# Patient Record
Sex: Male | Born: 1945 | Race: White | Hispanic: No | Marital: Married | Smoking: Current some day smoker
Health system: Southern US, Community
[De-identification: ages and names within clinical notes are randomized; demographics above are authoritative.]

## PROBLEM LIST (undated history)

## (undated) DIAGNOSIS — I10 Essential (primary) hypertension: Secondary | ICD-10-CM

## (undated) DIAGNOSIS — D329 Benign neoplasm of meninges, unspecified: Secondary | ICD-10-CM

## (undated) DIAGNOSIS — W3400XA Accidental discharge from unspecified firearms or gun, initial encounter: Secondary | ICD-10-CM

## (undated) DIAGNOSIS — IMO0002 Reserved for concepts with insufficient information to code with codable children: Secondary | ICD-10-CM

## (undated) DIAGNOSIS — C3491 Malignant neoplasm of unspecified part of right bronchus or lung: Principal | ICD-10-CM

## (undated) DIAGNOSIS — E7800 Pure hypercholesterolemia, unspecified: Secondary | ICD-10-CM

## (undated) DIAGNOSIS — J189 Pneumonia, unspecified organism: Secondary | ICD-10-CM

## (undated) DIAGNOSIS — E119 Type 2 diabetes mellitus without complications: Secondary | ICD-10-CM

## (undated) DIAGNOSIS — J449 Chronic obstructive pulmonary disease, unspecified: Secondary | ICD-10-CM

## (undated) DIAGNOSIS — I809 Phlebitis and thrombophlebitis of unspecified site: Secondary | ICD-10-CM

## (undated) DIAGNOSIS — Z9289 Personal history of other medical treatment: Secondary | ICD-10-CM

## (undated) HISTORY — PX: HERNIA REPAIR: SHX51

## (undated) HISTORY — DX: Benign neoplasm of meninges, unspecified: D32.9

## (undated) HISTORY — PX: KNEE SURGERY: SHX244

## (undated) HISTORY — PX: COLONOSCOPY: SHX174

## (undated) HISTORY — PX: ABDOMINAL SURGERY: SHX537

## (undated) HISTORY — DX: Malignant neoplasm of unspecified part of right bronchus or lung: C34.91

---
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
## 1999-09-13 ENCOUNTER — Encounter: Admission: RE | Admit: 1999-09-13 | Discharge: 1999-09-13 | Payer: Self-pay | Admitting: Urology

## 1999-09-13 ENCOUNTER — Encounter: Payer: Self-pay | Admitting: Urology

## 2006-10-21 ENCOUNTER — Inpatient Hospital Stay (HOSPITAL_COMMUNITY): Admission: EM | Admit: 2006-10-21 | Discharge: 2006-10-23 | Payer: Self-pay

## 2011-09-05 ENCOUNTER — Other Ambulatory Visit: Payer: Self-pay | Admitting: Anesthesiology

## 2011-09-05 DIAGNOSIS — M542 Cervicalgia: Secondary | ICD-10-CM

## 2011-09-10 ENCOUNTER — Ambulatory Visit
Admission: RE | Admit: 2011-09-10 | Discharge: 2011-09-10 | Disposition: A | Discharge status: Home / Self Care | Payer: Medicare Other | Source: Ambulatory Visit | Attending: Anesthesiology | Admitting: Anesthesiology

## 2011-09-10 DIAGNOSIS — M542 Cervicalgia: Secondary | ICD-10-CM

## 2012-12-27 ENCOUNTER — Emergency Department (HOSPITAL_COMMUNITY): Payer: Medicare Other

## 2012-12-27 ENCOUNTER — Emergency Department (HOSPITAL_COMMUNITY)
Admission: EM | Admit: 2012-12-27 | Discharge: 2012-12-28 | Discharge status: Against Medical Advice | Payer: Medicare Other | Attending: Emergency Medicine | Admitting: Emergency Medicine

## 2012-12-27 ENCOUNTER — Encounter (HOSPITAL_COMMUNITY): Payer: Self-pay | Admitting: Emergency Medicine

## 2012-12-27 DIAGNOSIS — M546 Pain in thoracic spine: Secondary | ICD-10-CM | POA: Insufficient documentation

## 2012-12-27 DIAGNOSIS — J449 Chronic obstructive pulmonary disease, unspecified: Secondary | ICD-10-CM | POA: Insufficient documentation

## 2012-12-27 DIAGNOSIS — M542 Cervicalgia: Secondary | ICD-10-CM | POA: Insufficient documentation

## 2012-12-27 DIAGNOSIS — M545 Low back pain, unspecified: Secondary | ICD-10-CM | POA: Insufficient documentation

## 2012-12-27 DIAGNOSIS — I10 Essential (primary) hypertension: Secondary | ICD-10-CM | POA: Insufficient documentation

## 2012-12-27 DIAGNOSIS — G8929 Other chronic pain: Secondary | ICD-10-CM | POA: Insufficient documentation

## 2012-12-27 DIAGNOSIS — Z87891 Personal history of nicotine dependence: Secondary | ICD-10-CM | POA: Insufficient documentation

## 2012-12-27 DIAGNOSIS — J4489 Other specified chronic obstructive pulmonary disease: Secondary | ICD-10-CM | POA: Insufficient documentation

## 2012-12-27 DIAGNOSIS — Z79899 Other long term (current) drug therapy: Secondary | ICD-10-CM | POA: Insufficient documentation

## 2012-12-27 DIAGNOSIS — M549 Dorsalgia, unspecified: Secondary | ICD-10-CM

## 2012-12-27 DIAGNOSIS — Z8739 Personal history of other diseases of the musculoskeletal system and connective tissue: Secondary | ICD-10-CM | POA: Insufficient documentation

## 2012-12-27 DIAGNOSIS — Z8669 Personal history of other diseases of the nervous system and sense organs: Secondary | ICD-10-CM | POA: Insufficient documentation

## 2012-12-27 DIAGNOSIS — E7800 Pure hypercholesterolemia, unspecified: Secondary | ICD-10-CM | POA: Insufficient documentation

## 2012-12-27 DIAGNOSIS — Z8781 Personal history of (healed) traumatic fracture: Secondary | ICD-10-CM | POA: Insufficient documentation

## 2012-12-27 DIAGNOSIS — E119 Type 2 diabetes mellitus without complications: Secondary | ICD-10-CM | POA: Insufficient documentation

## 2012-12-27 HISTORY — DX: Reserved for concepts with insufficient information to code with codable children: IMO0002

## 2012-12-27 HISTORY — DX: Pure hypercholesterolemia, unspecified: E78.00

## 2012-12-27 HISTORY — DX: Type 2 diabetes mellitus without complications: E11.9

## 2012-12-27 HISTORY — DX: Chronic obstructive pulmonary disease, unspecified: J44.9

## 2012-12-27 HISTORY — DX: Essential (primary) hypertension: I10

## 2012-12-27 LAB — COMPREHENSIVE METABOLIC PANEL
ALT: 110 U/L — ABNORMAL HIGH (ref 0–53)
AST: 88 U/L — ABNORMAL HIGH (ref 0–37)
Alkaline Phosphatase: 68 U/L (ref 39–117)
CO2: 32 mEq/L (ref 19–32)
Chloride: 99 mEq/L (ref 96–112)
Creatinine, Ser: 0.9 mg/dL (ref 0.50–1.35)
GFR calc non Af Amer: 87 mL/min — ABNORMAL LOW (ref >90)
Sodium: 139 mEq/L (ref 135–145)
Total Bilirubin: 0.7 mg/dL (ref 0.3–1.2)

## 2012-12-27 LAB — CBC WITH DIFFERENTIAL/PLATELET
Basophils Absolute: 0.1 10*3/uL (ref 0.0–0.1)
HCT: 47.9 % (ref 39.0–52.0)
Lymphocytes Relative: 27 % (ref 12–46)
Monocytes Absolute: 1 10*3/uL (ref 0.1–1.0)
Neutro Abs: 4.3 10*3/uL (ref 1.7–7.7)
RDW: 13.7 % (ref 11.5–15.5)
WBC: 7.8 10*3/uL (ref 4.0–10.5)

## 2012-12-27 LAB — URINALYSIS, ROUTINE W REFLEX MICROSCOPIC
Hgb urine dipstick: NEGATIVE
Ketones, ur: NEGATIVE mg/dL
Protein, ur: NEGATIVE mg/dL
Urobilinogen, UA: 4 mg/dL — ABNORMAL HIGH (ref 0.0–1.0)

## 2012-12-27 MED ORDER — HYDROMORPHONE HCL PF 2 MG/ML IJ SOLN
2.0000 mg | Freq: Once | INTRAMUSCULAR | Status: AC
Start: 2012-12-27 — End: 2012-12-27
  Administered 2012-12-27: 2 mg via INTRAMUSCULAR
  Filled 2012-12-27: qty 1
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
## 2012-12-28 NOTE — ED Notes (Signed)
Pt. Advised by PA that he would be considered AMA if he left before his results from his xrays were resulted. Pt. Left anyway.

## 2012-12-29 NOTE — ED Provider Notes (Signed)
Medical screening examination/treatment/procedure(s) were performed by non-physician practitioner and as supervising physician I was immediately available for consultation/collaboration.  Sunnie Nielsen, MD 12/29/12 [phone]

## 2012-12-30 LAB — URINE CULTURE
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
## 2014-10-04 ENCOUNTER — Encounter: Payer: Self-pay | Admitting: Pulmonary Disease

## 2014-10-04 ENCOUNTER — Ambulatory Visit (INDEPENDENT_AMBULATORY_CARE_PROVIDER_SITE_OTHER): Payer: Medicare Other | Admitting: Pulmonary Disease

## 2014-10-04 VITALS — BP 128/74 | HR 106 | Ht 65.0 in | Wt 200.0 lb

## 2014-10-04 DIAGNOSIS — Z720 Tobacco use: Secondary | ICD-10-CM

## 2014-10-04 DIAGNOSIS — R918 Other nonspecific abnormal finding of lung field: Secondary | ICD-10-CM

## 2014-10-04 DIAGNOSIS — J42 Unspecified chronic bronchitis: Secondary | ICD-10-CM

## 2014-10-04 NOTE — Progress Notes (Deleted)
   Subjective:    Patient ID: [name] Day, male    DOB: 28-Sep-1945, 69 y.o.   MRN: [id number]  HPI    Review of Systems  Constitutional: Negative for fever and unexpected weight change.  HENT: Positive for congestion. Negative for dental problem, ear pain, nosebleeds, postnasal drip, rhinorrhea, sinus pressure, sneezing, sore throat and trouble swallowing.   Eyes: Negative for redness and itching.  Respiratory: Positive for cough and shortness of breath. Negative for chest tightness and wheezing.   Cardiovascular: Positive for leg swelling ( feet swelling). Negative for palpitations.  Gastrointestinal: Negative for nausea and vomiting.  Genitourinary: Negative for dysuria.  Musculoskeletal: Negative for joint swelling.  Skin: Negative for rash.  Neurological: Negative for headaches.  Hematological: Does not bruise/bleed easily.  Psychiatric/Behavioral: Negative for dysphoric mood. The patient is nervous/anxious.        Objective:   Physical Exam        Assessment & Plan:

## 2014-10-04 NOTE — Progress Notes (Signed)
Chief Complaint  Patient presents with  . PULMONARY CONSULT    Referred by Dr Hardin Negus. Abn CT at [facility] -- lung mass in right upper lung    History of Present Illness: [name] Day is a 69 y.o. male smoker for evaluation of lung mass.  He has been seen by surgery for back pain.  He was told by his insurance company that he needed lung cancer screening.  He was found to have a lung mass in Rt lung.  He continues to smoke up to 1 pack per day.  He uses advair and prn albuterol.  He gets cough and chest congestion.  He gets winded with activity, but is limited more because of back and joint pain.  He denies fever, chest pain, headache, voice change, vision change, sweats, weight loss, or hemoptysis.  He was told he has COPD.  He has not had recent PFTs.  He had pneumonia in January 2015.  There is no history of TB.  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  He was never in the [employer].  He denies animal exposures.  He is from [place].  Tests: CT chest 09/14/14 >> atherosclerosis, 10 mm Rt paratracheal LAN, 2.6 cm low attenuation in liver no change from 2014, 3.1 x 2.9 cm mass RUL, 5 mm nodule lingula  [name] Day  has a past medical history of Meningioma; COPD (chronic obstructive pulmonary disease); Diabetes mellitus without complication; Hypertension; Hypercholesteremia; and DDD (degenerative disc disease).  [name] Day  has past surgical history that includes Abdominal surgery and Hernia repair.  Prior to Admission medications   Medication Sig Start Date End Date Taking? Authorizing Provider  albuterol (PROVENTIL HFA;VENTOLIN HFA) 108 (90 BASE) MCG/ACT inhaler Inhale 2 puffs into the lungs every 4 (four) hours as needed for wheezing. For wheezing   Yes Historical Provider, MD  Cholecalciferol 4000 UNITS CAPS Take 1 capsule by mouth daily.   Yes Historical Provider, MD  diazepam (VALIUM) 5 MG tablet Take 5 mg by mouth 2 (two) times daily as needed. For sleep  and pain   Yes Historical Provider, MD  losartan (COZAAR) 100 MG tablet Take 100 mg by mouth daily.   Yes Historical Provider, MD  oxyCODONE-acetaminophen (PERCOCET) 7.5-325 MG per tablet Take 1 tablet by mouth every 6 (six) hours as needed for pain. For pain   Yes Historical Provider, MD  oxymorphone (OPANA ER) 30 MG 12 hr tablet Take 30 mg by mouth every 12 (twelve) hours.   Yes Historical Provider, MD  pravastatin (PRAVACHOL) 20 MG tablet Take 20 mg by mouth at bedtime.   Yes Historical Provider, MD  testosterone cypionate (DEPOTESTOTERONE CYPIONATE) 100 MG/ML injection Inject 100 mg into the muscle every 7 (seven) days. For IM use only every Fri   Yes Historical Provider, MD    No Known Allergies  His family history includes Allergies in his sister; Bone cancer in his father; Emphysema in his mother and sister; Heart disease in his brother; Liver cancer in his father; Lung cancer in his father and sister.  He  reports that he has been smoking Cigarettes.  He has a 55 pack-year smoking history. His smokeless tobacco use includes Chew. He reports that he does not drink alcohol or use illicit drugs.  Review of Systems  Constitutional: Negative for fever and unexpected weight change.  HENT: Positive for congestion. Negative for dental problem, ear pain, nosebleeds, postnasal drip, rhinorrhea, sinus pressure, sneezing, sore throat and trouble swallowing.  Eyes: Negative for redness and itching.  Respiratory: Positive for cough and shortness of breath. Negative for chest tightness and wheezing.   Cardiovascular: Positive for leg swelling ( feet swelling). Negative for palpitations.  Gastrointestinal: Negative for nausea and vomiting.  Genitourinary: Negative for dysuria.  Musculoskeletal: Negative for joint swelling.  Skin: Negative for rash.  Neurological: Negative for headaches.  Hematological: Does not bruise/bleed easily.  Psychiatric/Behavioral: Negative for dysphoric mood. The patient  is nervous/anxious.    Physical Exam: Blood pressure 128/74, pulse 106, height 5\' 5"  (1.651 m), weight 200 lb (90.719 kg), SpO2 93 %. Body mass index is 33.28 kg/(m^2).  General - No distress ENT - No sinus tenderness, no oral exudate, no LAN, no thyromegaly, TM clear, pupils equal/reactive, wears dentures, poor dentition Cardiac - s1s2 regular, no murmur, pulses symmetric Chest - No wheeze/rales/dullness, good air entry, normal respiratory excursion Back - No focal tenderness Abd - Soft, non-tender, no organomegaly, + bowel sounds Ext - No edema Neuro - Normal strength, cranial nerves intact Skin - No rashes Psych - Normal mood, and behavior   Lab Results  Component Value Date   WBC 7.8 12/27/2012   HGB 16.3 12/27/2012   HCT 47.9 12/27/2012   MCV 97.6 12/27/2012   PLT 238 12/27/2012    Lab Results  Component Value Date   CREATININE 0.90 12/27/2012   BUN 14 12/27/2012   NA 139 12/27/2012   K 3.9 12/27/2012   CL 99 12/27/2012   CO2 32 12/27/2012    Lab Results  Component Value Date   ALT 110* 12/27/2012   AST 88* 12/27/2012   ALKPHOS 68 12/27/2012   BILITOT 0.7 12/27/2012   Discussion: He has spiculated mass in Rt upper lobe.  He has small nodule in lingula, and borderline mediastinal LAN.  He has continued tobacco abuse, and hx of COPD.  Main concern is that he has primary lung cancer.  Assessment/Plan:  Rt upper lung mass. Plan: - will arrange for PET scan and PFT  - depending on results will determine if he should have biopsy first or referral to thoracic surgery - if he needs biopsy, then needle biopsy with IR might be best approach  COPD with chronic bronchitis. Plan: - continue advair and prn albuterol  Tobacco abuse. Plan: - reviewed options to assist with smoking cessation.    Chesley Mires, MD [facility] Pager:  [phone]

## 2014-10-04 NOTE — Patient Instructions (Signed)
Will schedule PET scan and breathing test (PFT) Follow up in 6 weeks with Dr. Halford Chessman or [facility]

## 2014-10-13 ENCOUNTER — Ambulatory Visit (HOSPITAL_COMMUNITY)
Admission: RE | Admit: 2014-10-13 | Discharge: 2014-10-13 | Disposition: A | Discharge status: Home / Self Care | Payer: Medicare Other | Source: Ambulatory Visit | Attending: Pulmonary Disease | Admitting: Pulmonary Disease

## 2014-10-13 DIAGNOSIS — J42 Unspecified chronic bronchitis: Secondary | ICD-10-CM

## 2014-10-13 DIAGNOSIS — R918 Other nonspecific abnormal finding of lung field: Secondary | ICD-10-CM | POA: Diagnosis present

## 2014-10-13 DIAGNOSIS — Z720 Tobacco use: Secondary | ICD-10-CM

## 2014-10-13 LAB — GLUCOSE, CAPILLARY: GLUCOSE-CAPILLARY: 70 mg/dL (ref 70–99)

## 2014-10-13 MED ORDER — FLUDEOXYGLUCOSE F - 18 (FDG) INJECTION
9.9600 | Freq: Once | INTRAVENOUS | Status: AC | PRN
  Administered 2014-10-13: 9.96 via INTRAVENOUS

## 2014-10-19 ENCOUNTER — Telehealth: Payer: Self-pay | Admitting: Pulmonary Disease

## 2014-10-19 DIAGNOSIS — J984 Other disorders of lung: Secondary | ICD-10-CM

## 2014-10-19 NOTE — Telephone Encounter (Signed)
PET done 10/13/14. Calling for results. Please advise thanks

## 2014-10-19 NOTE — Telephone Encounter (Signed)
PET scan 0/12/22 >> hypermetabolic RUL cavitary mass, and LUL nodule  Results d/w pt.  Will need PFTs >> scheduled.  Will review results with Dr. Lamonte Sakai and determine if ENB is option to assess LUL nodule.

## 2014-10-20 NOTE — Telephone Encounter (Signed)
I reviewed films and discussed with Dr Halford Chessman. The size and location of the LUL nodule make it very technically difficult to get a biopsy even w ENB. I suspect ENB would be low yield until the nodule grows some - even then it would be difficult. I think best plan would be to concentrate on the large RUL nodule, either by standard FOB or by needle bx.

## 2014-10-24 NOTE — Telephone Encounter (Signed)
This has already been discussed with patient per VS. Nothing further needed.

## 2014-11-01 NOTE — Telephone Encounter (Signed)
Informed pt that getting to Lt lung nodule will be difficulty with bronchoscopy and ENB.  Will arrange for IR to assess for biopsy of Rt lung mass.  If not feasible, he would then need bronchoscopy and biopsy from Rt lung mass.

## 2014-11-01 NOTE — Addendum Note (Signed)
Addended by: Chesley Mires on: 11/01/2014 09:18 AM   Modules accepted: Orders

## 2014-11-06 ENCOUNTER — Other Ambulatory Visit: Payer: Self-pay | Admitting: Radiology

## 2014-11-07 ENCOUNTER — Ambulatory Visit (HOSPITAL_COMMUNITY)
Admission: RE | Admit: 2014-11-07 | Discharge: 2014-11-07 | Disposition: A | Discharge status: Home / Self Care | Payer: Medicare Other | Source: Ambulatory Visit | Attending: Interventional Radiology | Admitting: Interventional Radiology

## 2014-11-07 ENCOUNTER — Ambulatory Visit (HOSPITAL_COMMUNITY)
Admission: RE | Admit: 2014-11-07 | Discharge: 2014-11-07 | Disposition: A | Discharge status: Home / Self Care | Payer: Medicare Other | Source: Ambulatory Visit | Attending: Pulmonary Disease | Admitting: Pulmonary Disease

## 2014-11-07 ENCOUNTER — Encounter (HOSPITAL_COMMUNITY): Payer: Self-pay

## 2014-11-07 DIAGNOSIS — Z801 Family history of malignant neoplasm of trachea, bronchus and lung: Secondary | ICD-10-CM | POA: Diagnosis not present

## 2014-11-07 DIAGNOSIS — C3411 Malignant neoplasm of upper lobe, right bronchus or lung: Secondary | ICD-10-CM | POA: Diagnosis not present

## 2014-11-07 DIAGNOSIS — E119 Type 2 diabetes mellitus without complications: Secondary | ICD-10-CM | POA: Diagnosis not present

## 2014-11-07 DIAGNOSIS — J984 Other disorders of lung: Secondary | ICD-10-CM

## 2014-11-07 DIAGNOSIS — I10 Essential (primary) hypertension: Secondary | ICD-10-CM | POA: Insufficient documentation

## 2014-11-07 DIAGNOSIS — J449 Chronic obstructive pulmonary disease, unspecified: Secondary | ICD-10-CM | POA: Insufficient documentation

## 2014-11-07 DIAGNOSIS — Z79899 Other long term (current) drug therapy: Secondary | ICD-10-CM | POA: Insufficient documentation

## 2014-11-07 DIAGNOSIS — F17210 Nicotine dependence, cigarettes, uncomplicated: Secondary | ICD-10-CM | POA: Insufficient documentation

## 2014-11-07 DIAGNOSIS — J95811 Postprocedural pneumothorax: Secondary | ICD-10-CM

## 2014-11-07 DIAGNOSIS — R918 Other nonspecific abnormal finding of lung field: Secondary | ICD-10-CM | POA: Diagnosis present

## 2014-11-07 DIAGNOSIS — E780 Pure hypercholesterolemia: Secondary | ICD-10-CM | POA: Insufficient documentation

## 2014-11-07 DIAGNOSIS — Z7951 Long term (current) use of inhaled steroids: Secondary | ICD-10-CM | POA: Insufficient documentation

## 2014-11-07 DIAGNOSIS — Z825 Family history of asthma and other chronic lower respiratory diseases: Secondary | ICD-10-CM | POA: Diagnosis not present

## 2014-11-07 LAB — PROTIME-INR
INR: 0.98 (ref 0.00–1.49)
Prothrombin Time: 13.1 seconds (ref 11.6–15.2)

## 2014-11-07 LAB — CBC
HCT: 46.6 % (ref 39.0–52.0)
Hemoglobin: 15.4 g/dL (ref 13.0–17.0)
MCH: 32.4 pg (ref 26.0–34.0)
MCHC: 33 g/dL (ref 30.0–36.0)
MCV: 98.1 fL (ref 78.0–100.0)
Platelets: 180 10*3/uL (ref 150–400)
RBC: 4.75 MIL/uL (ref 4.22–5.81)
RDW: 14.3 % (ref 11.5–15.5)
WBC: 7.7 10*3/uL (ref 4.0–10.5)

## 2014-11-07 LAB — GLUCOSE, CAPILLARY: Glucose-Capillary: 132 mg/dL — ABNORMAL HIGH (ref 70–99)

## 2014-11-07 LAB — APTT: APTT: 29 s (ref 24–37)

## 2014-11-07 MED ORDER — SODIUM CHLORIDE 0.9 % IV SOLN
INTRAVENOUS | Status: DC
  Administered 2014-11-07: 08:00:00 via INTRAVENOUS

## 2014-11-07 MED ORDER — OXYCODONE-ACETAMINOPHEN 5-325 MG PO TABS
1.0000 | ORAL_TABLET | ORAL | Status: DC | PRN
Start: 2014-11-07 — End: 2014-11-08
  Administered 2014-11-07: 1 via ORAL
  Filled 2014-11-07: qty 1

## 2014-11-07 MED ORDER — MIDAZOLAM HCL 2 MG/2ML IJ SOLN
INTRAMUSCULAR | Status: AC
  Filled 2014-11-07: qty 4

## 2014-11-07 MED ORDER — MIDAZOLAM HCL 2 MG/2ML IJ SOLN
INTRAMUSCULAR | Status: AC | PRN
  Administered 2014-11-07: 1 mg via INTRAVENOUS

## 2014-11-07 MED ORDER — FENTANYL CITRATE (PF) 100 MCG/2ML IJ SOLN
INTRAMUSCULAR | Status: AC | PRN
  Administered 2014-11-07: 25 ug via INTRAVENOUS

## 2014-11-07 MED ORDER — FENTANYL CITRATE (PF) 100 MCG/2ML IJ SOLN
INTRAMUSCULAR | Status: AC
  Filled 2014-11-07: qty 2

## 2014-11-07 MED ORDER — SODIUM CHLORIDE 0.9 % IV SOLN
8.0000 mg | Freq: Four times a day (QID) | INTRAVENOUS | Status: DC | PRN
  Filled 2014-11-07: qty 4

## 2014-11-07 MED ORDER — OXYCODONE-ACETAMINOPHEN 5-325 MG PO TABS
ORAL_TABLET | ORAL | Status: AC
  Administered 2014-11-07: 1 via ORAL
  Filled 2014-11-07: qty 1

## 2014-11-07 NOTE — Sedation Documentation (Signed)
Pt has stopped coughing- no hemoptysis at this time. MD wants pt to wear 02 for 'support' only. sats 91% 2L/[place]. Pt was 90-92% R AIR prior to start.

## 2014-11-07 NOTE — Discharge Instructions (Signed)
Lung Biopsy A lung biopsy is a procedure in which a tissue sample is removed from the lung. The tissue can be examined under a microscope to help diagnose various lung disorders.  LET [facility] CARE PROVIDER KNOW ABOUT:  Any allergies you have.  All medicines you are taking, including vitamins, herbs, eye drops, creams, and over-the-counter medicines.  Previous problems you or members of your family have had with the use of anesthetics.  Any blood disorders or bleeding problems that you have.  Previous surgeries you have had.  Medical conditions you have. RISKS AND COMPLICATIONS Generally, a lung biopsy is a safe procedure. However, problems can occur and include:  Collapse of the lung.   Bleeding.   Infection.  BEFORE THE PROCEDURE  Do not eat or drink anything after midnight on the night before the procedure or as directed by your health care provider.  Ask your health care provider about changing or stopping your regular medicines. This is especially important if you are taking diabetes medicines or blood thinners.  Plan to have someone take you home after the procedure. PROCEDURE Various methods can be used to perform a lung biopsy:   Needle biopsy. A biopsy needle is inserted into the lung. The needle is used to collect the tissue sample. A CT scanner may be used to guide the needle to the right place in the lung. For this method, a medicine is used to numb the area where the biopsy sample will be taken (local anesthetic).  Bronchoscopy. A flexible tube (bronchoscope) is inserted into your lungs by going through your mouth or nose. A needle or forceps is passed through the bronchoscope to remove the tissue sample. For this method, medicine may be used to numb the back of your throat.  Open biopsy. A cut (incision) is made in your chest. The tissue sample is then removed using surgical tools. The incision is closed with skin glue, skin adhesive strips, or stitches. For  this method, you will be given medicine to make you sleep through the procedure (general anesthetic). AFTER THE PROCEDURE  Your recovery will be assessed and monitored.  You might have soreness and tenderness at the site of the biopsy for a few days after the procedure.  You might have a cough and some soreness in your throat for a few days if a bronchoscope was used. Document Released: 09/18/2004 Document Revised: 11/14/2013 Document Reviewed: 12/12/2012 [facility] Patient Information 2015 [place], [place]. This information is not intended to replace advice given to you by your health care provider. Make sure you discuss any questions you have with your health care provider. Needle Biopsy of Lung, Care After Refer to this sheet in the next few weeks. These instructions provide you with information on caring for yourself after your procedure. Your health care provider may also give you more specific instructions. Your treatment has been planned according to current medical practices, but problems sometimes occur. Call your health care provider if you have any problems or questions after your procedure. WHAT TO EXPECT AFTER THE PROCEDURE  A bandage will be applied over the area where the needle was inserted. You may be asked to apply pressure to the bandage for several minutes to ensure there is minimal bleeding.  In most cases, you can leave when your needle biopsy procedure is completed. Do not drive yourself home. Someone else should take you home.  If you received an IV sedative or general anesthetic, you will be taken to a comfortable place  to relax while the medicine wears off.  If you have upcoming travel scheduled, talk to your health care provider about when it is safe to travel by air after the procedure. HOME CARE INSTRUCTIONS  Expect to take it easy for the rest of the day.  Protect the area where you received the needle biopsy by keeping the bandage in place for as long as  instructed.  You may feel some mild pain or discomfort in the area, but this should stop in a day or two.  Take medicines only as directed by your health care provider. SEEK MEDICAL CARE IF:   You have pain at the biopsy site that worsens or is not helped by medicine.  You have swelling or drainage at the needle biopsy site.  You have a fever. SEEK IMMEDIATE MEDICAL CARE IF:   You have new or worsening shortness of breath.  You have chest pain.  You are coughing up blood.  You have bleeding that does not stop with pressure or a bandage.  You develop light-headedness or fainting. Document Released: 04/27/2007 Document Revised: 11/14/2013 Document Reviewed: 11/22/2012 [facility] Patient Information 2015 [place], [place]. This information is not intended to replace advice given to you by your health care provider. Make sure you discuss any questions you have with your health care provider.

## 2014-11-07 NOTE — Sedation Documentation (Signed)
Pt to stay R side down for 3 hrs- then chest xray before able to eat

## 2014-11-07 NOTE — Sedation Documentation (Addendum)
Having hemopysis. PO suctioning prn.
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
## 2014-11-07 NOTE — Procedures (Signed)
Interventional Radiology Procedure Note  Procedure: CT guided core biospy of right upper lung mass.   Complications: Hemoptysis, expected.  No extra-ordinary measures.  Oxygenation maintained.  Normal HR & BP.   No PTX Findings:  Pulmonary hemorrhage at the site of bx.  Did not enlarge over the course of 15 minutes. No PTX Recommendations: - Bedrest right decubitus x 3 hrs - Analgesia - Follow up CXR in 3 hours - Follow biopsy results  Signed,  Dulcy Fanny. Earleen Newport, DO

## 2014-11-07 NOTE — Sedation Documentation (Signed)
Pt has not had any more hemoptysis, VS stable, will transfer to [facility] recovery.

## 2014-11-07 NOTE — Sedation Documentation (Signed)
Pt is COPD pt- does not wear 02 at home- R AIR sats 90-92%

## 2014-11-10 ENCOUNTER — Telehealth: Payer: Self-pay | Admitting: Pulmonary Disease

## 2014-11-10 ENCOUNTER — Encounter: Payer: Self-pay | Admitting: Pulmonary Disease

## 2014-11-10 DIAGNOSIS — C3491 Malignant neoplasm of unspecified part of right bronchus or lung: Secondary | ICD-10-CM

## 2014-11-10 HISTORY — DX: Malignant neoplasm of unspecified part of right bronchus or lung: C34.91

## 2014-11-10 NOTE — Telephone Encounter (Signed)
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  Discussed results with patient.  Will arrange for referral to oncology.

## 2014-11-13 ENCOUNTER — Institutional Professional Consult (permissible substitution) (INDEPENDENT_AMBULATORY_CARE_PROVIDER_SITE_OTHER): Payer: Medicare Other | Admitting: Cardiothoracic Surgery

## 2014-11-13 ENCOUNTER — Other Ambulatory Visit: Payer: Self-pay | Admitting: *Deleted

## 2014-11-13 ENCOUNTER — Encounter: Payer: Self-pay | Admitting: Cardiothoracic Surgery

## 2014-11-13 VITALS — BP 142/79 | HR 110 | Resp 20 | Ht 65.0 in | Wt 200.0 lb

## 2014-11-13 DIAGNOSIS — R918 Other nonspecific abnormal finding of lung field: Secondary | ICD-10-CM

## 2014-11-13 LAB — BUN: BUN: 15 mg/dL (ref 6–23)

## 2014-11-13 LAB — CREATININE, SERUM: Creat: 0.86 mg/dL (ref 0.50–1.35)
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
## 2014-11-13 NOTE — Patient Instructions (Signed)
Lung Cancer  Lung cancer is an abnormal growth of cells in one or both of your lungs. These extra cells may form a mass of tissue called a growth or tumor. Tumors can be either cancerous (malignant) or not cancerous (benign).   Lung cancer is the most common cause of cancer death in men and women. There are several different types of lung cancers. Usually, lung cancer is described as either small cell lung cancer or nonsmall cell lung cancer. Other types of cancer occur in the lungs, including carcinoid and cancers spread from other organs. The types of cancer have different behavior and treatment.  RISK FACTORS  Smoking is the most common risk factor for developing lung cancer. Other risk factors include:  · Radon gas exposure.  · Asbestos and other industrial substance exposure.  · Second hand tobacco smoke.  · Air pollution.  · Family or personal history of lung cancer.  · Age older than 65 years.  CAUSES   Lung cancer usually starts when the lungs are exposed to harmful chemicals. Smoking is the most common risk factor for lung cancer. When you quit smoking, your risk of lung cancer falls each year (but is never the same as a person who has never smoked).   SYMPTOMS   Lung cancer may not have any symptoms in its early stages. The symptoms can depend on the type of cancer, its location, and other factors. Symptoms can include:  · Cough (either new, different, or more severe).  · Shortness of breath.  · Coughing up blood (hemoptysis).  · Chest pain.  · Hoarseness.  · Swelling of the face.  · Drooping eyelid.  · Changes in blood tests, such as low sodium (hyponatremia), high calcium (hypercalcemia), or low blood count (anemia).  · Weight loss.  DIAGNOSIS   Your health care provider may suspect lung cancer based on your symptoms or based on tests obtained for other reasons. Tests or procedures used to find or confirm the presence of lung cancer may include:  · Chest X-ray.  · CT scan of the lungs and chest.  · Blood  tests.  · Taking a tissue sample (biopsy) from your lung to look for cancer cells.  Your cancer will be staged to determine its severity and extent. Staging is a careful attempt to find out the size of the tumor, whether the cancer has spread, and if so, to what parts of the body. You may need to have more tests to determine the stage of your cancer. The test results will help determine what treatment plan is best for you.   · Stage 0--This is the earliest stage of lung cancer. In this stage the tumor is present in only a few layers of cells and has not grown beyond the inner lining of the lungs. Stage 0 (carcinoma in situ) is considered noninvasive, meaning at this stage it is not yet capable of spreading to other regions.  · Stage I-- The cancer is located only in the lungs and not spread to any lymph nodes.  · Stage II--The cancer is in the lungs and the nearby lymph nodes.  · Stage III--The cancer is in the lungs and the lymph nodes in the middle of the chest. This is also called locally advanced disease. This stage has two subtypes:  ¨ Stage IIIa - The cancer has spread only to lymph nodes on the same side of the chest where the cancer started.  ¨ Stage IIIb - The cancer   stage describes when the cancer has spread to both lungs, the fluid in the area around the lungs, or to another body part. Your health care provider may tell you the detailed stage of your cancer, which includes both a number and a letter.  TREATMENT  Depending on the type and stage, lung cancer may be treated with surgery, radiation therapy, chemotherapy, or targeted therapy. Some people have a combination of these therapies. Your treatment plan will be developed by your health care team.  [place] not smoke.  Only take  over-the-counter or prescription medicines for pain, discomfort, or fever as directed by your health care provider.  Maintain a healthy diet.  Consider joining a support group. This may help you learn to cope with the stress of having lung cancer.  Seek advice to help you manage treatment side effects.  Keep all follow-up appointments as directed by your health care provider.  Inform your cancer specialist if you are admitted to the hospital. [place] IF:   You are losing weight without trying.  You have a persistent cough.  You feel short of breath.  You tire easily. SEEK IMMEDIATE MEDICAL CARE IF:   You cough up clotted blood or bright red blood.  Your pain is not manageable or controlled by medicine.  You develop new difficulty breathing or chest pain.  You develop swelling in one or both ankles or legs, or swelling in your face or neck.  You develop headache or confusion. Document Released: 10/06/2000 Document Revised: 04/20/2013 Document Reviewed: 11/03/2013 [facility] Patient Information 2015 [place], [place]. This information is not intended to replace advice given to you by your health care provider. Make sure you discuss any questions you have with your health care provider.  Lung Resection A lung resection is a procedure to remove part or all of a lung. When an entire lung is removed, the procedure is called a pneumonectomy. When only part of a lung is removed, the procedure is called a lobectomy. A lung resection is typically done to get rid of a tumor or cancer, but it may be done to treat other conditions. This procedure can help relieve some or all of your symptoms and can also help keep the problem from getting worse. Lung resection may provide the best chance for curing your disease. However, the procedure may not necessarily cure lung cancer if that is the problem.  LET [facility] CARE PROVIDER KNOW ABOUT:   Any allergies you have.  All medicines you  are taking, including vitamins, herbs, eye drops, creams, and over-the-counter medicines.  Previous problems you or members of your family have had with the use of anesthetics.  Any blood disorders you have.  Previous surgeries you have had.  Medical conditions you have. RISKS AND COMPLICATIONS  Generally, lung resection is a safe procedure. However, problems can occur and include:  Excessive bleeding.  Infection.  Inability to breathe without a ventilator.  Persistent shortness of breath.  Heart problems, including abnormal rhythms and a risk of heart attack or heart failure.  Blood clots.  Injury to a blood vessel.  Injury to a nerve.  Failure to heal properly.  Stroke.  Bronchopleural fistula. This is a small hole between one of the main breathing tubes (bronchus) and the lining of the lungs. This is rare.  Reaction to anesthesia. BEFORE THE PROCEDURE  You may have tests done before the procedure, including:  Blood tests.  Urine tests.  X-rays.  Other imaging tests (such as CT scans, MRI scans, and PET scans). These tests are done to find the exact size and location of the condition being treated with this surgery.  Pulmonary function tests. These are breathing tests to assess the function of your lungs before surgery and to decide how to best help your breathing after surgery.  Heart testing. This is done to make sure your heart is strong enough for the procedure.  Bronchoscopy. This is a technique that allows your health care provider to look at the inside of your airways. This is done using a soft, flexible tube (bronchoscope). Along with imaging tests, this can help your health care provider know the exact location and size of the area that will be removed during surgery.  Lymph node sampling. This may need to be done to see if the tumor has spread. It may be done as a separate surgery or right before your lung resection procedure. PROCEDURE  An IV tube  will be placed in your arm. You will be given a medicine that makes you fall asleep (general anesthetic). You may also get pain medicine through a thin, flexible tube (catheter) in your back.  A breathing tube will be placed in your throat.  Once the surgical team has prepared you for surgery, your surgeon will make an incision on your side. Some resections are done through large incisions, while others can be done through small incisions using smaller instruments and assisted with small cameras (laparoscopic surgery).  Your surgeon will carefully cut the veins, arteries, and bronchus leading to your lung. After being cut, each of these pieces will be sewn or stapled closed. The lung or part of the lung will then be removed.  Your surgeon will check inside your chest to make sure there is no bleeding in or around the lungs. Lymph nodes near the lung may also be removed for later tests.  Your surgeon may put tubes into your chest to drain extra fluid and air after surgery.  Your incision will be closed. This may be done using:  Stitches that absorb into your body and do not need to be removed.  Stitches that must be removed.  Staples that must be removed. AFTER THE PROCEDURE   You will be taken to the recovery area and your progress will be monitored. You may still have a breathing tube and other tubes or catheters in your body immediately after surgery. These will be removed during your recovery. You may be put on a respirator following surgery if some assistance is needed to help your breathing. When you are awake and not experiencing immediate problems from surgery, you will be moved to the intensive care unit (ICU) where you will continue your recovery.  You may feel pain in your chest and throat. Sometimes during recovery, patients may shiver or feel nauseous. You will be given medicine to help with pain and nausea.  The breathing tube will be taken out as soon as your health care  providers feel you can breathe on your own. For most people, this happens on the same day as the surgery.  If your surgery and time in the ICU go well, most of the tubes and equipment will be taken out within 1-2 days after surgery. This is about how long most people stay in the ICU. You may need to stay longer, depending on how you are doing.  You should also start respiratory therapy in the ICU. This therapy uses breathing exercises  to help your other lung stay healthy and get stronger.  As you improve, you will be moved to a regular hospital room for continued respiratory therapy, help with your bladder and bowels, and to continue medicines.  After your lung or part of your lung is taken out, there will be a space inside your chest. This space will often fill up with fluid over time. The amount of time this takes is different for each person.  You will receive care until you are doing well and your health care provider feels it is safe for you to go home or to transfer to an extended care facility. Document Released: 09/20/2002 Document Revised: 11/14/2013 Document Reviewed: 08/19/2013 [facility] Patient Information 2015 [place], [place]. This information is not intended to replace advice given to you by your health care provider. Make sure you discuss any questions you have with your health care provider.  Lung Resection, Care After Refer to this sheet in the next few weeks. These instructions provide you with information on caring for yourself after your procedure. Your health care provider may also give you more specific instructions. Your treatment has been planned according to current medical practices, but problems sometimes occur. Call your health care provider if you have any problems or questions after your procedure. WHAT TO EXPECT AFTER THE PROCEDURE After your procedure, it is typical to have the following:   You may feel pain in your chest and throat.  Patients may sometimes shiver  or feel nauseous during recovery. HOME CARE INSTRUCTIONS  You may resume a normal diet and activities as directed by your health care provider.  Do not use any tobacco products, including cigarettes, chewing tobacco, or electronic cigarettes. If you need help quitting, ask your health care provider.  There are many different ways to close and cover an incision, including stitches, skin glue, and adhesive strips. Follow your health care provider's instructions on:  Incision care.  Bandage (dressing) changes and removal.  Incision closure removal.  Take medicines only as directed by your health care provider.  Keep all follow-up visits as directed by your health care provider. This is important.  Try to breathe deeply and cough as directed. Holding a pillow firmly over your ribs may help with discomfort.  If you were given an incentive spirometer in the hospital, continue to use it as directed by your health care provider.  Walk as directed by your health care provider.  You may take a shower and gently wash the area of your incision with water and soap as directed by your health care provider. Do not use anything else to clean your incision except as directed by your health care provider. Do not take baths, swim, or use a hot tub until your health care provider approves. SEEK MEDICAL CARE IF:  You notice redness, swelling, or increasing pain at the incision site.  You are bleeding at the incision site.  You see pus coming from the incision site.  You notice a bad smell coming from the incision site or bandage.  Your incision breaks open.  You cough up blood or pus, or you develop a cough that produces bad-smelling sputum.  You have pain or swelling in your legs.  You have increasing pain that is not controlled with medicine.  You have trouble managing any of the tubes that have been left in place after surgery.  You have fever or chills. SEEK IMMEDIATE MEDICAL CARE IF:     You have chest pain or  an irregular or rapid heartbeat.  You have dizzy episodes or faint.  You have shortness of breath or difficulty breathing.  You have persistent nausea or vomiting.  You have a rash. Document Released: 01/17/2005 Document Revised: 11/14/2013 Document Reviewed: 08/19/2013 [facility] Patient Information 2015 [place], [place]. This information is not intended to replace advice given to you by your health care provider. Make sure you discuss any questions you have with your health care provider.

## 2014-11-14 ENCOUNTER — Other Ambulatory Visit: Payer: Self-pay | Admitting: *Deleted

## 2014-11-14 DIAGNOSIS — C3491 Malignant neoplasm of unspecified part of right bronchus or lung: Secondary | ICD-10-CM

## 2014-11-15 ENCOUNTER — Ambulatory Visit (INDEPENDENT_AMBULATORY_CARE_PROVIDER_SITE_OTHER): Payer: Medicare Other | Admitting: Adult Health

## 2014-11-15 ENCOUNTER — Ambulatory Visit (INDEPENDENT_AMBULATORY_CARE_PROVIDER_SITE_OTHER): Payer: Medicare Other | Admitting: Pulmonary Disease

## 2014-11-15 ENCOUNTER — Encounter: Payer: Self-pay | Admitting: Adult Health

## 2014-11-15 VITALS — BP 134/78 | HR 89 | Temp 97.5°F | Ht 65.0 in | Wt 198.0 lb

## 2014-11-15 DIAGNOSIS — J449 Chronic obstructive pulmonary disease, unspecified: Secondary | ICD-10-CM | POA: Diagnosis not present

## 2014-11-15 DIAGNOSIS — Z720 Tobacco use: Secondary | ICD-10-CM

## 2014-11-15 DIAGNOSIS — C3491 Malignant neoplasm of unspecified part of right bronchus or lung: Secondary | ICD-10-CM

## 2014-11-15 DIAGNOSIS — R918 Other nonspecific abnormal finding of lung field: Secondary | ICD-10-CM

## 2014-11-15 DIAGNOSIS — J42 Unspecified chronic bronchitis: Secondary | ICD-10-CM

## 2014-11-15 LAB — PULMONARY FUNCTION TEST
DL/VA % pred: 89 %
DL/VA: 3.83 ml/min/mmHg/L
DLCO unc % pred: 82 %
DLCO unc: 21.04 ml/min/mmHg
FEF 25-75 PRE: 1.16 L/s
FEF 25-75 Post: 1.28 L/sec
FEF2575-%Change-Post: 10 %
FEF2575-%Pred-Post: 60 %
FEF2575-%Pred-Pre: 54 %
FEV1-%Change-Post: 3 %
FEV1-%Pred-Post: 78 %
FEV1-%Pred-Pre: 75 %
FEV1-Post: 2.12 L
FEV1-Pre: 2.06 L
FEV1FVC-%Change-Post: 2 %
FEV1FVC-%PRED-PRE: 90 %
FEV6-%Change-Post: 1 %
FEV6-%PRED-POST: 87 %
FEV6-%PRED-PRE: 86 %
FEV6-POST: 3.02 L
FEV6-Pre: 2.98 L
FEV6FVC-%CHANGE-POST: 1 %
FEV6FVC-%PRED-POST: 104 %
FEV6FVC-%PRED-PRE: 103 %
FVC-%CHANGE-POST: 0 %
FVC-%PRED-PRE: 83 %
FVC-%Pred-Post: 83 %
FVC-POST: 3.07 L
FVC-Pre: 3.06 L
PRE FEV6/FVC RATIO: 98 %
Post FEV1/FVC ratio: 69 %
Post FEV6/FVC ratio: 99 %
Pre FEV1/FVC ratio: 67 %

## 2014-11-15 NOTE — Assessment & Plan Note (Addendum)
GOLD II COPD  Will need prevnar in Oct this year   Plan  Take Advair 1 puff Twice daily  , rinse after use.  Only use Ventolin  Inhaler as needed -this is your rescue inhaler Continue to work on not smoking.  Follow up with Dr. Halford Chessman in 6-8 weeks and As needed   Follow up with surgeon as planned .

## 2014-11-15 NOTE — Progress Notes (Signed)
PFT performed today. 

## 2014-11-15 NOTE — Patient Instructions (Signed)
Take Advair 1 puff Twice daily  , rinse after use.  Only use Ventolin  Inhaler as needed -this is your rescue inhaler Continue to work on not smoking.  Follow up with Dr. Halford Chessman in 6-8 weeks and As needed   Follow up with surgeon as planned .

## 2014-11-15 NOTE — Progress Notes (Signed)
Subjective:    Patient ID: [name] Day, male    DOB: 08-Apr-1946, 69 y.o.   MRN: [id number]  HPI 69 year old male smoker seen for initial pulmonary consult in March 2016 for lung mass  Test Tests: CT chest 09/14/14 >> atherosclerosis, 10 mm Rt paratracheal LAN, 2.6 cm low attenuation in liver no change from 2014, 3.1 x 2.9 cm mass RUL, 5 mm nodule lingula PET scan positive. Hypermetabolic right upper lobe cavitary mass, positive. Hypermetabolic left upper lobe cavitary lesion. No hypermetabolic mediastinal adenopathy. 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  11/15/2014 Follow up : lung mass >squamous cell , COPD-smoker  Patient returns for a follow-up. Patient underwent a CT-guided biopsy of right upper lobe lung mass with pathology returning positive for squamous cell carcinoma  PET scan positive. Hypermetabolic right upper lobe cavitary mass, positive. Hypermetabolic left upper lobe cavitary lesion. No hypermetabolic mediastinal adenopathy. He was seen for a surgical consult Dr. Pia Mau on May 2. He has been set up for an MRI brain tomorrow. He has a pending cardiology consult for preop clearance. Patient underwent pulmonary function test. This morning that showed an FEV1 at 75%, ratio 67, FVC at 83%, no significant bronchodilator response Mid flows were decreased at 54%, Diffusing capacity was 82% Patient uses Advair as needed. He also uses Ventolin and reports that he uses several times a day. We discussed using his maintenance inhaler, Advair, on a consistent regular dosing schedule. And to use Ventolin, rescue inhaler, for emergency use only. We discussed smoking cessation. Patient denies any hemoptysis, orthopnea, PND or increased leg swelling.   Review of Systems  Constitutional:   No  weight loss, night sweats,  Fevers, chills, + fatigue, or  lassitude.  HEENT:   No headaches,  Difficulty swallowing,   Tooth/dental problems, or  Sore throat,                No sneezing, itching, ear ache, nasal congestion, post nasal drip,   CV:  No chest pain,  Orthopnea, PND, swelling in lower extremities, anasarca, dizziness, palpitations, syncope.   GI  No heartburn, indigestion, abdominal pain, nausea, vomiting, diarrhea, change in bowel habits, loss of appetite, bloody stools.   Resp:   No excess mucus, no productive cough,  No non-productive cough,  No coughing up of blood.  No change in color of mucus.  No wheezing.  No chest wall deformity  Skin: no rash or lesions.  GU: no dysuria, change in color of urine, no urgency or frequency.  No flank pain, no hematuria   MS:  No joint pain or swelling.  No decreased range of motion.  No back pain.  Psych:  No change in mood or affect. No depression or anxiety.  No memory loss.          Objective:   Physical Exam GEN: A/Ox3; pleasant , NAD, elderly   HEENT:  [place]/AT,  EACs-clear, TMs-wnl, NOSE-clear, THROAT-clear, no lesions, no postnasal drip or exudate noted. , poor dentition   NECK:  Supple w/ fair ROM; no JVD; normal carotid impulses w/o bruits; no thyromegaly or nodules palpated; no lymphadenopathy.  RESP  Decreased BS in bases no accessory muscle use, no dullness to percussion  CARD:  RRR, no m/r/g  , no peripheral edema, pulses intact, no cyanosis or clubbing.  GI:   Soft & nt; nml bowel sounds; no organomegaly or masses detected.  Musco: Warm bil, no deformities or joint swelling  noted.   Neuro: alert, no focal deficits noted.    Skin: Warm, no lesions or rashes         Assessment & Plan:

## 2014-11-15 NOTE — Assessment & Plan Note (Signed)
Patient underwent a CT-guided biopsy of right upper lobe lung mass with pathology returning positive for squamous cell carcinoma  Also positive LUL lesion  Staging workup with pending MRI brain tomorrow.  preop cards eval pending  Despite heavy smoking lung function is fairly preserved.  Encouraged on smoking cessation  follow up with surgeon as planned

## 2014-11-16 ENCOUNTER — Telehealth: Payer: Self-pay | Admitting: Pulmonary Disease

## 2014-11-16 ENCOUNTER — Ambulatory Visit
Admission: RE | Admit: 2014-11-16 | Discharge: 2014-11-16 | Disposition: A | Discharge status: Home / Self Care | Payer: Medicare Other | Source: Ambulatory Visit | Attending: Cardiothoracic Surgery | Admitting: Cardiothoracic Surgery

## 2014-11-16 DIAGNOSIS — C3491 Malignant neoplasm of unspecified part of right bronchus or lung: Secondary | ICD-10-CM

## 2014-11-16 NOTE — Telephone Encounter (Signed)
PFT 11/15/14 >> FEV1 2.12 (78%), FEV1% 69, TLC 4.65 (77%), DLCO 82%, no BD  Will have my nurse inform pt that PFT showed very mild changes of COPD.  He should continue his current inhaler regimen.  I will forward results to Dr. Servando Snare.

## 2014-11-17 ENCOUNTER — Encounter: Payer: Self-pay | Admitting: *Deleted

## 2014-11-17 ENCOUNTER — Encounter: Payer: Self-pay | Admitting: Cardiology

## 2014-11-17 ENCOUNTER — Ambulatory Visit (INDEPENDENT_AMBULATORY_CARE_PROVIDER_SITE_OTHER): Payer: Medicare Other | Admitting: Cardiology

## 2014-11-17 ENCOUNTER — Telehealth: Payer: Self-pay | Admitting: Cardiology

## 2014-11-17 VITALS — BP 117/73 | HR 89 | Ht 65.0 in | Wt 194.0 lb

## 2014-11-17 DIAGNOSIS — R0602 Shortness of breath: Secondary | ICD-10-CM

## 2014-11-17 DIAGNOSIS — Z136 Encounter for screening for cardiovascular disorders: Secondary | ICD-10-CM

## 2014-11-17 DIAGNOSIS — Z01810 Encounter for preprocedural cardiovascular examination: Secondary | ICD-10-CM | POA: Diagnosis not present

## 2014-11-17 MED ORDER — ASPIRIN EC 81 MG PO TBEC: 81.0000 mg | DELAYED_RELEASE_TABLET | Freq: Every day | ORAL | Status: AC

## 2014-11-17 NOTE — Telephone Encounter (Signed)
Pt is aware of results. Nothing further was needed. 

## 2014-11-17 NOTE — Telephone Encounter (Signed)
[place]ke ITUY#[id number] EXP 01-01-15

## 2014-11-17 NOTE — Telephone Encounter (Signed)
*   Lexiscan Myoview - SOB - please try to schedule for Monday per JB Schedule at [facility] May 9  Arrive at 7:15am
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
## 2014-11-17 NOTE — Patient Instructions (Signed)
Your physician has requested that you have a lexiscan myoview. For further information please visit [email or web address]. Please follow instruction sheet, as given. Office will contact with results via phone or letter.   Begin Aspirin '81mg'$  daily Continue all other medications.   Follow up will be based on test results

## 2014-11-20 ENCOUNTER — Ambulatory Visit (HOSPITAL_COMMUNITY)
Admission: RE | Admit: 2014-11-20 | Discharge: 2014-11-20 | Disposition: A | Discharge status: Home / Self Care | Payer: Medicare Other | Source: Ambulatory Visit | Attending: Cardiology | Admitting: Cardiology

## 2014-11-20 ENCOUNTER — Encounter (HOSPITAL_COMMUNITY)
Admission: RE | Admit: 2014-11-20 | Discharge: 2014-11-20 | Disposition: A | Discharge status: Home / Self Care | Payer: Medicare Other | Source: Ambulatory Visit | Attending: Cardiology | Admitting: Cardiology

## 2014-11-20 ENCOUNTER — Encounter (HOSPITAL_COMMUNITY): Payer: Self-pay

## 2014-11-20 DIAGNOSIS — R0602 Shortness of breath: Secondary | ICD-10-CM | POA: Diagnosis not present

## 2014-11-20 DIAGNOSIS — R918 Other nonspecific abnormal finding of lung field: Secondary | ICD-10-CM | POA: Diagnosis present

## 2014-11-20 LAB — NM MYOCAR MULTI W/SPECT W/WALL MOTION / EF
LHR: 0.21
LVDIAVOL: 86 mL
LVSYSVOL: 31 mL
NUC STRESS EF: 64 %
NUC STRESS TID: 0.75
SDS: 3
SRS: 0
SSS: 3

## 2014-11-20 MED ORDER — TECHNETIUM TC 99M SESTAMIBI - CARDIOLITE
30.0000 | Freq: Once | INTRAVENOUS | Status: AC | PRN
  Administered 2014-11-20: 10:00:00 30 via INTRAVENOUS

## 2014-11-20 MED ORDER — REGADENOSON 0.4 MG/5ML IV SOLN
INTRAVENOUS | Status: AC
Start: 2014-11-20 — End: 2014-11-20
  Administered 2014-11-20: 0.4 mg via INTRAVENOUS
  Filled 2014-11-20: qty 5

## 2014-11-20 MED ORDER — REGADENOSON 0.4 MG/5ML IV SOLN
0.4000 mg | Freq: Once | INTRAVENOUS | Status: AC
  Administered 2014-11-20: 0.4 mg via INTRAVENOUS

## 2014-11-20 MED ORDER — SODIUM CHLORIDE 0.9 % IJ SOLN
10.0000 mL | INTRAMUSCULAR | Status: DC | PRN
  Administered 2014-11-20: 10 mL via INTRAVENOUS
  Filled 2014-11-20: qty 10

## 2014-11-20 MED ORDER — SODIUM CHLORIDE 0.9 % IJ SOLN
INTRAMUSCULAR | Status: AC
Start: 2014-11-20 — End: 2014-11-20
  Administered 2014-11-20: 10 mL via INTRAVENOUS
  Filled 2014-11-20: qty 3

## 2014-11-20 MED ORDER — TECHNETIUM TC 99M SESTAMIBI GENERIC - CARDIOLITE
10.0000 | Freq: Once | INTRAVENOUS | Status: AC | PRN
  Administered 2014-11-20: 10 via INTRAVENOUS

## 2014-11-21 ENCOUNTER — Telehealth: Payer: Self-pay | Admitting: *Deleted

## 2014-11-21 ENCOUNTER — Other Ambulatory Visit: Payer: Self-pay | Admitting: *Deleted

## 2014-11-21 DIAGNOSIS — R918 Other nonspecific abnormal finding of lung field: Secondary | ICD-10-CM

## 2014-11-21 NOTE — Telephone Encounter (Signed)
Pt made aware, forwarded to Dr. Roxy Horseman as requested

## 2014-11-21 NOTE — Telephone Encounter (Signed)
-----   Message from [name], MD sent at 11/20/2014  4:28 PM EDT ----- Please let patient know stress test is normal, no evidence of any blockages in the arteries of his heart. Please forward my addended clinic note to his surgeon, recommend proceeding with his CT surgery.  Zandra Abts MD

## 2014-11-22 ENCOUNTER — Ambulatory Visit
Admission: RE | Admit: 2014-11-22 | Discharge: 2014-11-22 | Disposition: A | Discharge status: Home / Self Care | Payer: Medicare Other | Source: Ambulatory Visit | Attending: Cardiothoracic Surgery | Admitting: Cardiothoracic Surgery

## 2014-11-22 DIAGNOSIS — R918 Other nonspecific abnormal finding of lung field: Secondary | ICD-10-CM

## 2014-11-22 MED ORDER — IOPAMIDOL (ISOVUE-300) INJECTION 61%
75.0000 mL | Freq: Once | INTRAVENOUS | Status: AC | PRN
  Administered 2014-11-22: 75 mL via INTRAVENOUS

## 2014-11-23 ENCOUNTER — Ambulatory Visit (INDEPENDENT_AMBULATORY_CARE_PROVIDER_SITE_OTHER): Payer: Medicare Other | Admitting: Cardiothoracic Surgery

## 2014-11-23 ENCOUNTER — Encounter: Payer: Self-pay | Admitting: Cardiothoracic Surgery

## 2014-11-23 ENCOUNTER — Other Ambulatory Visit: Payer: Self-pay | Admitting: *Deleted

## 2014-11-23 VITALS — BP 135/85 | HR 96 | Resp 20 | Ht 65.0 in | Wt 194.0 lb

## 2014-11-23 DIAGNOSIS — R918 Other nonspecific abnormal finding of lung field: Secondary | ICD-10-CM

## 2014-11-23 NOTE — Evaluation (Signed)
6 Minute Walk Test Results  Patient: [name] Day Date:  11/23/2014   Supplemental O2 during test? none     Baseline   End  Time   1110   1116    Heartrate  86   103 Dyspnea  no   yes Fatigue  no   Yes from back pain- level 10   O2 sat   98%    95% Blood pressure 135/85    138/83   Patient ambulated at a steady pace for a total distance of 630 feet with 1-(30 sec) stop due to low back and hip pain.  Ambulation was limited primarily due to low back and hip pain.  Overall the test was tolerated well.

## 2014-11-23 NOTE — Patient Instructions (Signed)
Lung Resection, Care After Refer to this sheet in the next few weeks. These instructions provide you with information on caring for yourself after your procedure. Your health care provider may also give you more specific instructions. Your treatment has been planned according to current medical practices, but problems sometimes occur. Call your health care provider if you have any problems or questions after your procedure. WHAT TO EXPECT AFTER THE PROCEDURE After your procedure, it is typical to have the following:   You may feel pain in your chest and throat.  Patients may sometimes shiver or feel nauseous during recovery. HOME CARE INSTRUCTIONS  You may resume a normal diet and activities as directed by your health care provider.  Do not use any tobacco products, including cigarettes, chewing tobacco, or electronic cigarettes. If you need help quitting, ask your health care provider.  There are many different ways to close and cover an incision, including stitches, skin glue, and adhesive strips. Follow your health care provider's instructions on:  Incision care.  Bandage (dressing) changes and removal.  Incision closure removal.  Take medicines only as directed by your health care provider.  Keep all follow-up visits as directed by your health care provider. This is important.  Try to breathe deeply and cough as directed. Holding a pillow firmly over your ribs may help with discomfort.  If you were given an incentive spirometer in the hospital, continue to use it as directed by your health care provider.  Walk as directed by your health care provider.  You may take a shower and gently wash the area of your incision with water and soap as directed by your health care provider. Do not use anything else to clean your incision except as directed by your health care provider. Do not take baths, swim, or use a hot tub until your health care provider approves. SEEK MEDICAL CARE  IF:  You notice redness, swelling, or increasing pain at the incision site.  You are bleeding at the incision site.  You see pus coming from the incision site.  You notice a bad smell coming from the incision site or bandage.  Your incision breaks open.  You cough up blood or pus, or you develop a cough that produces bad-smelling sputum.  You have pain or swelling in your legs.  You have increasing pain that is not controlled with medicine.  You have trouble managing any of the tubes that have been left in place after surgery.  You have fever or chills. SEEK IMMEDIATE MEDICAL CARE IF:   You have chest pain or an irregular or rapid heartbeat.  You have dizzy episodes or faint.  You have shortness of breath or difficulty breathing.  You have persistent nausea or vomiting.  You have a rash. Document Released: 01/17/2005 Document Revised: 11/14/2013 Document Reviewed: 08/19/2013 [facility] Patient Information 2015 [place], [place]. This information is not intended to replace advice given to you by your health care provider. Make sure you discuss any questions you have with your health care provider. Lung Resection A lung resection is a procedure to remove part or all of a lung. When an entire lung is removed, the procedure is called a pneumonectomy. When only part of a lung is removed, the procedure is called a lobectomy. A lung resection is typically done to get rid of a tumor or cancer, but it may be done to treat other conditions. This procedure can help relieve some or all of your symptoms and can also help  keep the problem from getting worse. Lung resection may provide the best chance for curing your disease. However, the procedure may not necessarily cure lung cancer if that is the problem.  LET [facility] CARE PROVIDER KNOW ABOUT:   Any allergies you have.  All medicines you are taking, including vitamins, herbs, eye drops, creams, and over-the-counter medicines.  Previous  problems you or members of your family have had with the use of anesthetics.  Any blood disorders you have.  Previous surgeries you have had.  Medical conditions you have. RISKS AND COMPLICATIONS  Generally, lung resection is a safe procedure. However, problems can occur and include:  Excessive bleeding.  Infection.  Inability to breathe without a ventilator.  Persistent shortness of breath.  Heart problems, including abnormal rhythms and a risk of heart attack or heart failure.  Blood clots.  Injury to a blood vessel.  Injury to a nerve.  Failure to heal properly.  Stroke.  Bronchopleural fistula. This is a small hole between one of the main breathing tubes (bronchus) and the lining of the lungs. This is rare.  Reaction to anesthesia. BEFORE THE PROCEDURE  You may have tests done before the procedure, including:  Blood tests.  Urine tests.  X-rays.  Other imaging tests (such as CT scans, MRI scans, and PET scans). These tests are done to find the exact size and location of the condition being treated with this surgery.  Pulmonary function tests. These are breathing tests to assess the function of your lungs before surgery and to decide how to best help your breathing after surgery.  Heart testing. This is done to make sure your heart is strong enough for the procedure.  Bronchoscopy. This is a technique that allows your health care provider to look at the inside of your airways. This is done using a soft, flexible tube (bronchoscope). Along with imaging tests, this can help your health care provider know the exact location and size of the area that will be removed during surgery.  Lymph node sampling. This may need to be done to see if the tumor has spread. It may be done as a separate surgery or right before your lung resection procedure. PROCEDURE  An IV tube will be placed in your arm. You will be given a medicine that makes you fall asleep (general  anesthetic). You may also get pain medicine through a thin, flexible tube (catheter) in your back.  A breathing tube will be placed in your throat.  Once the surgical team has prepared you for surgery, your surgeon will make an incision on your side. Some resections are done through large incisions, while others can be done through small incisions using smaller instruments and assisted with small cameras (laparoscopic surgery).  Your surgeon will carefully cut the veins, arteries, and bronchus leading to your lung. After being cut, each of these pieces will be sewn or stapled closed. The lung or part of the lung will then be removed.  Your surgeon will check inside your chest to make sure there is no bleeding in or around the lungs. Lymph nodes near the lung may also be removed for later tests.  Your surgeon may put tubes into your chest to drain extra fluid and air after surgery.  Your incision will be closed. This may be done using:  Stitches that absorb into your body and do not need to be removed.  Stitches that must be removed.  Staples that must be removed. AFTER THE PROCEDURE  You will be taken to the recovery area and your progress will be monitored. You may still have a breathing tube and other tubes or catheters in your body immediately after surgery. These will be removed during your recovery. You may be put on a respirator following surgery if some assistance is needed to help your breathing. When you are awake and not experiencing immediate problems from surgery, you will be moved to the intensive care unit (ICU) where you will continue your recovery.  You may feel pain in your chest and throat. Sometimes during recovery, patients may shiver or feel nauseous. You will be given medicine to help with pain and nausea.  The breathing tube will be taken out as soon as your health care providers feel you can breathe on your own. For most people, this happens on the same day as the  surgery.  If your surgery and time in the ICU go well, most of the tubes and equipment will be taken out within 1-2 days after surgery. This is about how long most people stay in the ICU. You may need to stay longer, depending on how you are doing.  You should also start respiratory therapy in the ICU. This therapy uses breathing exercises to help your other lung stay healthy and get stronger.  As you improve, you will be moved to a regular hospital room for continued respiratory therapy, help with your bladder and bowels, and to continue medicines.  After your lung or part of your lung is taken out, there will be a space inside your chest. This space will often fill up with fluid over time. The amount of time this takes is different for each person.  You will receive care until you are doing well and your health care provider feels it is safe for you to go home or to transfer to an extended care facility. Document Released: 09/20/2002 Document Revised: 11/14/2013 Document Reviewed: 08/19/2013 [facility] Patient Information 2015 [place], [place]. This information is not intended to replace advice given to you by your health care provider. Make sure you discuss any questions you have with your health care provider.  Video-Assisted Thoracic Surgery Video-assisted thoracic surgery (VATS) is a procedure that allows your caregiver to look inside your chest and do some minor operations. VATS is commonly done to:  Study or diagnose problems in the chest.  Take a tissue sample (biopsy).   Put medications directly into the chest.   Remove collections of fluid, pus, or blood.  Remove tumors. VATS is done using thoracoscopy. Thoracoscopy is a procedure in which a thin, lighted tube (thoracoscope) is put through a small surgical cut (incision)inyour chest wall. The thoracoscope used for VATS has a video camera on it. It allows your caregiver to see the inside of your chest on a television  screen. LET YOUR CAREGIVER KNOW ABOUT:   Any allergies you have.  All medications you are taking, including vitamins, herbs, eyedrops, and over-the-counter medications and creams.  Use of steroids (by mouth or creams).   Previous problems you or members of your family have had with the use of anesthetics.  Any blood disorders you have had.  Any blood thinners, like warfarin, that you take.  Previous surgery.   Any history of heart problems.  Other health problems you RISKS AND COMPLICATIONS  Severe bleeding (hemorrhage).  Injury to nerves or other structures in the chest.   Lung infection.  The chest may need to be opened with a large incision (thoracotomy)  if the procedure cannot be completed safely with the thoracoscope. BEFORE THE PROCEDURE   Tests such as blood tests, urine tests, chest X-rays, and electrocardiography may be done.  Follow your caregiver's instructions if you are taking dietary supplements or medications. Your caregiver may tell you to stop taking them or to reduce your dosage.  Do not take new dietary supplements or medications within 1 week of your procedure unless your caregiver approves them.  Do not eat or drink within 8 hours of your procedure or as directed by your caregiver.  Do not smoke for as long as possible before your procedure. If possible, stop smoking 3-6 weeks before the procedure. PROCEDURE   You will be given a medication that makes you sleep (general anesthetic) through a mask, through an intravenous (IV) access tube, or through both. This medication will prevent you from being alert and feeling pain during the procedure.Once you are asleep, abreathing tube or mask may be used to help you breathe.  A video thoracoscope will be placed in a very small incision (less than an inch wide), so that your caregiver can see into your chest. The picture from inside the chest will be displayed on a television screen.  Other surgical  tools will be inserted through the remaining incisions.  One of your lungs will be deflated as the surgery is performed. Deflating a lung makes it easier for your caregiver to see the area. The lung will be inflated at the end of the procedure.  The tools will be removed when your caregiver has finished performing the examination or procedure.  A chest tube will be inserted through an incision to drain the wound. The remaining incisions will be closed with stitches or staples. AFTER THE PROCEDURE  Chest tubes are watched closely for signs of fluid or air buildup in the lungs. Your caregiver will most likely remove them prior to discharge.  You will need to stay in the hospital for 1-5 days. The average time in the hospital with VATS surgery is usually several days less than with standard open surgery.  You may receive medications to help with pain.  It is normal to feel sore for about 2 weeks after the procedure. Document Released: 10/25/2012 Document Reviewed: 10/25/2012 [facility] Patient Information 2015 [place]. This information is not intended to replace advice given to you by your health care provider. Make sure you discuss any questions you have with your health care provider.
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
## 2014-11-24 ENCOUNTER — Encounter (HOSPITAL_COMMUNITY)
Admission: RE | Admit: 2014-11-24 | Discharge: 2014-11-24 | Disposition: A | Discharge status: Home / Self Care | Payer: Medicare Other | Source: Ambulatory Visit | Attending: Cardiothoracic Surgery | Admitting: Cardiothoracic Surgery

## 2014-11-24 ENCOUNTER — Encounter (HOSPITAL_COMMUNITY): Payer: Self-pay

## 2014-11-24 ENCOUNTER — Other Ambulatory Visit (HOSPITAL_COMMUNITY): Payer: Self-pay | Admitting: *Deleted

## 2014-11-24 VITALS — BP 135/68 | HR 90 | Temp 98.6°F | Resp 20 | Wt 200.5 lb

## 2014-11-24 DIAGNOSIS — Z0183 Encounter for blood typing: Secondary | ICD-10-CM | POA: Insufficient documentation

## 2014-11-24 DIAGNOSIS — R918 Other nonspecific abnormal finding of lung field: Secondary | ICD-10-CM | POA: Insufficient documentation

## 2014-11-24 DIAGNOSIS — Z01812 Encounter for preprocedural laboratory examination: Secondary | ICD-10-CM | POA: Insufficient documentation

## 2014-11-24 HISTORY — DX: Reserved for concepts with insufficient information to code with codable children: IMO0002

## 2014-11-24 HISTORY — DX: Phlebitis and thrombophlebitis of unspecified site: I80.9

## 2014-11-24 HISTORY — DX: Personal history of other medical treatment: Z92.89

## 2014-11-24 HISTORY — DX: Accidental discharge from unspecified firearms or gun, initial encounter: W34.00XA

## 2014-11-24 HISTORY — DX: Pneumonia, unspecified organism: J18.9

## 2014-11-24 LAB — CBC
HCT: 46.1 % (ref 39.0–52.0)
Hemoglobin: 16 g/dL (ref 13.0–17.0)
MCH: 33.1 pg (ref 26.0–34.0)
MCHC: 34.7 g/dL (ref 30.0–36.0)
MCV: 95.4 fL (ref 78.0–100.0)
Platelets: 270 10*3/uL (ref 150–400)
RBC: 4.83 MIL/uL (ref 4.22–5.81)
RDW: 13.2 % (ref 11.5–15.5)
WBC: 9.1 10*3/uL (ref 4.0–10.5)

## 2014-11-24 LAB — COMPREHENSIVE METABOLIC PANEL
ALT: 22 U/L (ref 17–63)
AST: 25 U/L (ref 15–41)
Albumin: 3.9 g/dL (ref 3.5–5.0)
Alkaline Phosphatase: 64 U/L (ref 38–126)
Anion gap: 14 (ref 5–15)
BUN: 17 mg/dL (ref 6–20)
CO2: 22 mmol/L (ref 22–32)
Calcium: 9.9 mg/dL (ref 8.9–10.3)
Chloride: 102 mmol/L (ref 101–111)
Creatinine, Ser: 1.04 mg/dL (ref 0.61–1.24)
GFR calc Af Amer: >60 mL/min (ref >60)
GFR calc non Af Amer: >60 mL/min (ref >60)
Glucose, Bld: 70 mg/dL (ref 65–99)
Potassium: 4.2 mmol/L (ref 3.5–5.1)
Sodium: 138 mmol/L (ref 135–145)
Total Bilirubin: 0.5 mg/dL (ref 0.3–1.2)
Total Protein: 7.5 g/dL (ref 6.5–8.1)

## 2014-11-24 LAB — BLOOD GAS, ARTERIAL
Acid-Base Excess: 2.2 mmol/L — ABNORMAL HIGH (ref 0.0–2.0)
Bicarbonate: 26.5 mEq/L — ABNORMAL HIGH (ref 20.0–24.0)
Drawn by: 206361
FIO2: 0.21 %
O2 Saturation: 95 %
Patient temperature: 98.6
TCO2: 27.8 mmol/L (ref 0–100)
pCO2 arterial: 43.1 mmHg (ref 35.0–45.0)
pH, Arterial: 7.406 (ref 7.350–7.450)
pO2, Arterial: 68.9 mmHg — ABNORMAL LOW (ref 80.0–100.0)

## 2014-11-24 LAB — TYPE AND SCREEN
ABO/RH(D): A POS
Antibody Screen: NEGATIVE

## 2014-11-24 LAB — PROTIME-INR
INR: 1.01 (ref 0.00–1.49)
Prothrombin Time: 13.4 seconds (ref 11.6–15.2)

## 2014-11-24 LAB — URINALYSIS, ROUTINE W REFLEX MICROSCOPIC
Bilirubin Urine: NEGATIVE
Glucose, UA: NEGATIVE mg/dL
Hgb urine dipstick: NEGATIVE
Ketones, ur: NEGATIVE mg/dL
Leukocytes, UA: NEGATIVE
Nitrite: NEGATIVE
Protein, ur: NEGATIVE mg/dL
Specific Gravity, Urine: 1.01 (ref 1.005–1.030)
Urobilinogen, UA: 1 mg/dL (ref 0.0–1.0)
pH: 5.5 (ref 5.0–8.0)

## 2014-11-24 LAB — SURGICAL PCR SCREEN
MRSA, PCR: NEGATIVE
Staphylococcus aureus: NEGATIVE

## 2014-11-24 LAB — APTT: aPTT: 31 seconds (ref 24–37)

## 2014-11-24 LAB — ABO/RH: ABO/RH(D): A POS

## 2014-11-24 NOTE — Pre-Procedure Instructions (Signed)
[name] Day  11/24/2014   Your procedure is scheduled on:  Wednesday, Nov 29, 2014 at 8:30 AM.   Report to [facility] Entrance "A" Admitting Office at 6:30 AM.   Call this number if you have problems the morning of surgery: [phone]               Any questions prior to day of surgery, please call [phone] between 8 & 4 PM.   Remember:   Do not eat food or drink liquids after midnight Tuesday, 11/28/14.   Take these medicines the morning of surgery with A SIP OF WATER: Oxymorphone (Opana), Advair inhaler, Albuterol inhaler - if needed             Do not take Glimepiride (Amaryl) the morning of surgery.   Do not wear jewelry.  Do not wear lotions, powders, or cologne. You may NOT wear deodorant.  Men may shave face and neck.  Do not bring valuables to the hospital.  [facility] is not responsible                  for any belongings or valuables.               Contacts, dentures or bridgework may not be worn into surgery.  Leave suitcase in the car. After surgery it may be brought to your room.  For patients admitted to the hospital, discharge time is determined by your                treatment team.               Special Instructions:  - Preparing for Surgery  Before surgery, you can play an important role.  Because skin is not sterile, your skin needs to be as free of germs as possible.  You can reduce the number of germs on you skin by washing with CHG (chlorahexidine gluconate) soap before surgery.  CHG is an antiseptic cleaner which kills germs and bonds with the skin to continue killing germs even after washing.  Please DO NOT use if you have an allergy to CHG or antibacterial soaps.  If your skin becomes reddened/irritated stop using the CHG and inform your nurse when you arrive at Short Stay.  Do not shave (including legs and underarms) for at least 48 hours prior to the first CHG shower.  You may shave your face.  Please follow these instructions  carefully:   1.  Shower with CHG Soap the night before surgery and the                                morning of Surgery.  2.  If you choose to wash your hair, wash your hair first as usual with your       normal shampoo.  3.  After you shampoo, rinse your hair and body thoroughly to remove the                      Shampoo.  4.  Use CHG as you would any other liquid soap.  You can apply chg directly       to the skin and wash gently with scrungie or a clean washcloth.  5.  Apply the CHG Soap to your body ONLY FROM THE NECK DOWN.        Do not use on open wounds or  open sores.  Avoid contact with your eyes, ears, mouth and genitals (private parts).  Wash genitals (private parts) with your normal soap.  6.  Wash thoroughly, paying special attention to the area where your surgery        will be performed.  7.  Thoroughly rinse your body with warm water from the neck down.  8.  DO NOT shower/wash with your normal soap after using and rinsing off       the CHG Soap.  9.  Pat yourself dry with a clean towel.            10.  Wear clean pajamas.            11.  Place clean sheets on your bed the night of your first shower and do not        sleep with pets.  Day of Surgery  Do not apply any lotions/deodorants the morning of surgery.  Please wear clean clothes to the hospital.     Please read over the following fact sheets that you were given: Pain Booklet, Coughing and Deep Breathing, Blood Transfusion Information, MRSA Information and Surgical Site Infection Prevention

## 2014-11-24 NOTE — Progress Notes (Signed)
   11/24/14 1313  OBSTRUCTIVE SLEEP APNEA  Have you ever been diagnosed with sleep apnea through a sleep study? No  Do you snore loudly (loud enough to be heard through closed doors)?  0  Do you often feel tired, fatigued, or sleepy during the daytime? 1  Has anyone observed you stop breathing during your sleep? 0  Do you have, or are you being treated for high blood pressure? 1  BMI more than 35 kg/m2? 0  Age over 68 years old? 1  Neck circumference greater than 40 cm/16 inches? 1 (18)  Gender: 1

## 2014-11-28 ENCOUNTER — Encounter (HOSPITAL_COMMUNITY): Payer: Self-pay | Admitting: Certified Registered Nurse Anesthetist

## 2014-11-28 MED ORDER — DEXTROSE 5 % IV SOLN
1.5000 g | INTRAVENOUS | Status: AC
  Administered 2014-11-29 (×2): 1.5 g via INTRAVENOUS
  Filled 2014-11-28: qty 1.5

## 2014-11-29 ENCOUNTER — Inpatient Hospital Stay (HOSPITAL_COMMUNITY): Payer: Medicare Other

## 2014-11-29 ENCOUNTER — Encounter (HOSPITAL_COMMUNITY)
Admission: RE | Disposition: A | Discharge status: Home Health | Payer: Self-pay | Source: Ambulatory Visit | Attending: Cardiothoracic Surgery

## 2014-11-29 ENCOUNTER — Inpatient Hospital Stay (HOSPITAL_COMMUNITY)
Admission: RE | Admit: 2014-11-29 | Discharge: 2014-12-15 | DRG: 164 | Disposition: A | Discharge status: Home Health | Payer: Medicare Other | Source: Ambulatory Visit | Attending: Cardiothoracic Surgery | Admitting: Cardiothoracic Surgery

## 2014-11-29 ENCOUNTER — Inpatient Hospital Stay (HOSPITAL_COMMUNITY): Payer: Medicare Other | Admitting: Certified Registered Nurse Anesthetist

## 2014-11-29 ENCOUNTER — Encounter (HOSPITAL_COMMUNITY): Payer: Self-pay | Admitting: General Practice

## 2014-11-29 DIAGNOSIS — K567 Ileus, unspecified: Secondary | ICD-10-CM | POA: Diagnosis not present

## 2014-11-29 DIAGNOSIS — J9811 Atelectasis: Secondary | ICD-10-CM | POA: Diagnosis not present

## 2014-11-29 DIAGNOSIS — Z902 Acquired absence of lung [part of]: Secondary | ICD-10-CM

## 2014-11-29 DIAGNOSIS — J939 Pneumothorax, unspecified: Secondary | ICD-10-CM

## 2014-11-29 DIAGNOSIS — E119 Type 2 diabetes mellitus without complications: Secondary | ICD-10-CM | POA: Diagnosis present

## 2014-11-29 DIAGNOSIS — J449 Chronic obstructive pulmonary disease, unspecified: Secondary | ICD-10-CM | POA: Diagnosis present

## 2014-11-29 DIAGNOSIS — C3411 Malignant neoplasm of upper lobe, right bronchus or lung: Secondary | ICD-10-CM | POA: Diagnosis present

## 2014-11-29 DIAGNOSIS — M549 Dorsalgia, unspecified: Secondary | ICD-10-CM | POA: Diagnosis not present

## 2014-11-29 DIAGNOSIS — I959 Hypotension, unspecified: Secondary | ICD-10-CM | POA: Diagnosis present

## 2014-11-29 DIAGNOSIS — M62838 Other muscle spasm: Secondary | ICD-10-CM | POA: Diagnosis not present

## 2014-11-29 DIAGNOSIS — R918 Other nonspecific abnormal finding of lung field: Secondary | ICD-10-CM

## 2014-11-29 DIAGNOSIS — C3490 Malignant neoplasm of unspecified part of unspecified bronchus or lung: Secondary | ICD-10-CM

## 2014-11-29 DIAGNOSIS — J9382 Other air leak: Secondary | ICD-10-CM | POA: Diagnosis present

## 2014-11-29 DIAGNOSIS — I509 Heart failure, unspecified: Secondary | ICD-10-CM | POA: Diagnosis not present

## 2014-11-29 DIAGNOSIS — T797XXA Traumatic subcutaneous emphysema, initial encounter: Secondary | ICD-10-CM | POA: Diagnosis not present

## 2014-11-29 DIAGNOSIS — I10 Essential (primary) hypertension: Secondary | ICD-10-CM | POA: Diagnosis present

## 2014-11-29 DIAGNOSIS — D329 Benign neoplasm of meninges, unspecified: Secondary | ICD-10-CM | POA: Diagnosis present

## 2014-11-29 DIAGNOSIS — F17210 Nicotine dependence, cigarettes, uncomplicated: Secondary | ICD-10-CM | POA: Diagnosis present

## 2014-11-29 DIAGNOSIS — Z9689 Presence of other specified functional implants: Secondary | ICD-10-CM

## 2014-11-29 HISTORY — PX: THORACOTOMY: SHX5074

## 2014-11-29 HISTORY — PX: VIDEO ASSISTED THORACOSCOPY (VATS)/ LOBECTOMY: SHX6169

## 2014-11-29 HISTORY — PX: LOBECTOMY: SHX5089

## 2014-11-29 HISTORY — PX: VIDEO BRONCHOSCOPY: SHX5072

## 2014-11-29 HISTORY — PX: LYMPH NODE DISSECTION: SHX5087

## 2014-11-29 LAB — GLUCOSE, CAPILLARY
Glucose-Capillary: 123 mg/dL — ABNORMAL HIGH (ref 65–99)
Glucose-Capillary: 144 mg/dL — ABNORMAL HIGH (ref 65–99)
Glucose-Capillary: 166 mg/dL — ABNORMAL HIGH (ref 65–99)
Glucose-Capillary: 209 mg/dL — ABNORMAL HIGH (ref 65–99)

## 2014-11-29 SURGERY — BRONCHOSCOPY, VIDEO-ASSISTED
Anesthesia: General | Site: Chest | Laterality: Right

## 2014-11-29 MED ORDER — HYDROMORPHONE HCL 1 MG/ML IJ SOLN
INTRAMUSCULAR | Status: DC | PRN
  Administered 2014-11-29 (×2): 0.5 mg via INTRAVENOUS

## 2014-11-29 MED ORDER — VANCOMYCIN HCL IN DEXTROSE 1-5 GM/200ML-% IV SOLN
1000.0000 mg | Freq: Two times a day (BID) | INTRAVENOUS | Status: AC
  Administered 2014-11-29: 1000 mg via INTRAVENOUS
  Filled 2014-11-29: qty 200

## 2014-11-29 MED ORDER — OXYCODONE HCL 5 MG PO TABS
15.0000 mg | ORAL_TABLET | Freq: Four times a day (QID) | ORAL | Status: DC | PRN
  Administered 2014-11-29 – 2014-12-12 (×44): 15 mg via ORAL
  Filled 2014-11-29 (×44): qty 3

## 2014-11-29 MED ORDER — FENTANYL CITRATE (PF) 250 MCG/5ML IJ SOLN
INTRAMUSCULAR | Status: AC
  Filled 2014-11-29: qty 5

## 2014-11-29 MED ORDER — NALOXONE HCL 0.4 MG/ML IJ SOLN: 0.4000 mg | INTRAMUSCULAR | Status: DC | PRN

## 2014-11-29 MED ORDER — FENTANYL 10 MCG/ML IV SOLN
INTRAVENOUS | Status: DC
Start: 2014-11-29 — End: 2014-11-29
  Administered 2014-11-29: 18:00:00 via INTRAVENOUS
  Filled 2014-11-29 (×2): qty 50

## 2014-11-29 MED ORDER — ACETAMINOPHEN 160 MG/5ML PO SOLN: 1000.0000 mg | Freq: Four times a day (QID) | ORAL | Status: AC

## 2014-11-29 MED ORDER — SODIUM CHLORIDE 0.9 % IJ SOLN: 9.0000 mL | INTRAMUSCULAR | Status: DC | PRN

## 2014-11-29 MED ORDER — FENTANYL CITRATE (PF) 100 MCG/2ML IJ SOLN
INTRAMUSCULAR | Status: DC | PRN
  Administered 2014-11-29 (×5): 50 ug via INTRAVENOUS
  Administered 2014-11-29 (×2): 100 ug via INTRAVENOUS
  Administered 2014-11-29 (×6): 50 ug via INTRAVENOUS

## 2014-11-29 MED ORDER — HEMOSTATIC AGENTS (NO CHARGE) OPTIME
TOPICAL | Status: DC | PRN
  Administered 2014-11-29: 1 via TOPICAL

## 2014-11-29 MED ORDER — CETYLPYRIDINIUM CHLORIDE 0.05 % MT LIQD
7.0000 mL | Freq: Two times a day (BID) | OROMUCOSAL | Status: DC
  Administered 2014-11-29 – 2014-12-14 (×27): 7 mL via OROMUCOSAL

## 2014-11-29 MED ORDER — ONDANSETRON HCL 4 MG/2ML IJ SOLN
4.0000 mg | Freq: Four times a day (QID) | INTRAMUSCULAR | Status: DC | PRN
  Filled 2014-11-29: qty 2

## 2014-11-29 MED ORDER — KETOROLAC TROMETHAMINE 15 MG/ML IJ SOLN
15.0000 mg | Freq: Once | INTRAMUSCULAR | Status: AC
  Administered 2014-11-29: 15 mg via INTRAVENOUS
  Filled 2014-11-29: qty 1

## 2014-11-29 MED ORDER — PRAVASTATIN SODIUM 20 MG PO TABS
20.0000 mg | ORAL_TABLET | Freq: Every day | ORAL | Status: DC
  Administered 2014-11-29 – 2014-12-14 (×16): 20 mg via ORAL
  Filled 2014-11-29 (×17): qty 1

## 2014-11-29 MED ORDER — DIPHENHYDRAMINE HCL 12.5 MG/5ML PO ELIX
12.5000 mg | ORAL_SOLUTION | Freq: Four times a day (QID) | ORAL | Status: DC | PRN
  Filled 2014-11-29: qty 5

## 2014-11-29 MED ORDER — DEXTROSE 5 % IV SOLN
1.5000 g | INTRAVENOUS | Status: DC
  Filled 2014-11-29: qty 1.5

## 2014-11-29 MED ORDER — FENTANYL 10 MCG/ML IV SOLN
INTRAVENOUS | Status: DC
  Administered 2014-11-29 – 2014-11-30 (×2): via INTRAVENOUS
  Administered 2014-11-30: 230 ug via INTRAVENOUS
  Administered 2014-11-30: 220 ug via INTRAVENOUS
  Administered 2014-11-30: 380 ug via INTRAVENOUS
  Administered 2014-11-30: 230 ug via INTRAVENOUS
  Administered 2014-11-30: 210 ug via INTRAVENOUS
  Administered 2014-11-30: 120 ug via INTRAVENOUS
  Administered 2014-11-30 (×2): via INTRAVENOUS
  Administered 2014-12-01: 234 ug via INTRAVENOUS
  Administered 2014-12-01 (×2): via INTRAVENOUS
  Administered 2014-12-01: 210 ug via INTRAVENOUS
  Administered 2014-12-01: 230 ug via INTRAVENOUS
  Administered 2014-12-01: 260 ug via INTRAVENOUS
  Administered 2014-12-01: 280 ug via INTRAVENOUS
  Administered 2014-12-01: 13:00:00 via INTRAVENOUS
  Administered 2014-12-01: 210 ug via INTRAVENOUS
  Administered 2014-12-02: 200 ug via INTRAVENOUS
  Administered 2014-12-02: 250 ug via INTRAVENOUS
  Filled 2014-11-29 (×8): qty 50

## 2014-11-29 MED ORDER — NITROGLYCERIN IN D5W 200-5 MCG/ML-% IV SOLN: 0.0000 ug/min | INTRAVENOUS | Status: DC

## 2014-11-29 MED ORDER — INSULIN ASPART 100 UNIT/ML ~~LOC~~ SOLN
0.0000 [IU] | SUBCUTANEOUS | Status: DC
  Administered 2014-11-29: 2 [IU] via SUBCUTANEOUS
  Administered 2014-11-29: 3 [IU] via SUBCUTANEOUS
  Administered 2014-11-30 – 2014-12-01 (×3): 2 [IU] via SUBCUTANEOUS

## 2014-11-29 MED ORDER — VITAMIN D3 25 MCG (1000 UNIT) PO TABS
4000.0000 [IU] | ORAL_TABLET | Freq: Every day | ORAL | Status: DC
Start: 2014-11-30 — End: 2014-12-15
  Administered 2014-11-30 – 2014-12-15 (×16): 4000 [IU] via ORAL
  Filled 2014-11-29 (×16): qty 4

## 2014-11-29 MED ORDER — POTASSIUM CHLORIDE 10 MEQ/50ML IV SOLN: 10.0000 meq | Freq: Every day | INTRAVENOUS | Status: DC | PRN

## 2014-11-29 MED ORDER — GLYCOPYRROLATE 0.2 MG/ML IJ SOLN
INTRAMUSCULAR | Status: DC | PRN
  Administered 2014-11-29: .8 mg via INTRAVENOUS

## 2014-11-29 MED ORDER — ARTIFICIAL TEARS OP OINT
TOPICAL_OINTMENT | OPHTHALMIC | Status: AC
  Filled 2014-11-29: qty 3.5

## 2014-11-29 MED ORDER — MORPHINE SULFATE ER 60 MG PO TBCR
60.0000 mg | EXTENDED_RELEASE_TABLET | Freq: Two times a day (BID) | ORAL | Status: DC
  Administered 2014-11-30 – 2014-12-15 (×31): 60 mg via ORAL
  Filled 2014-11-29 (×31): qty 1

## 2014-11-29 MED ORDER — ALBUMIN HUMAN 5 % IV SOLN
INTRAVENOUS | Status: DC | PRN
  Administered 2014-11-29 (×2): via INTRAVENOUS

## 2014-11-29 MED ORDER — LIDOCAINE HCL (CARDIAC) 20 MG/ML IV SOLN
INTRAVENOUS | Status: AC
  Filled 2014-11-29: qty 5

## 2014-11-29 MED ORDER — NEOSTIGMINE METHYLSULFATE 10 MG/10ML IV SOLN
INTRAVENOUS | Status: DC | PRN
  Administered 2014-11-29: 4 mg via INTRAVENOUS

## 2014-11-29 MED ORDER — POTASSIUM CHLORIDE IN NACL 20-0.45 MEQ/L-% IV SOLN
INTRAVENOUS | Status: DC
  Administered 2014-11-29 – 2014-11-30 (×3): via INTRAVENOUS
  Filled 2014-11-29 (×7): qty 1000

## 2014-11-29 MED ORDER — MOMETASONE FURO-FORMOTEROL FUM 100-5 MCG/ACT IN AERO
2.0000 | INHALATION_SPRAY | Freq: Two times a day (BID) | RESPIRATORY_TRACT | Status: DC
  Administered 2014-11-29 – 2014-12-15 (×32): 2 via RESPIRATORY_TRACT
  Filled 2014-11-29: qty 8.8

## 2014-11-29 MED ORDER — ONDANSETRON HCL 4 MG/2ML IJ SOLN
INTRAMUSCULAR | Status: DC | PRN
  Administered 2014-11-29: 4 mg via INTRAVENOUS

## 2014-11-29 MED ORDER — ALBUTEROL SULFATE HFA 108 (90 BASE) MCG/ACT IN AERS
INHALATION_SPRAY | RESPIRATORY_TRACT | Status: DC | PRN
  Administered 2014-11-29: 2 via RESPIRATORY_TRACT
  Administered 2014-11-29: 4 via RESPIRATORY_TRACT

## 2014-11-29 MED ORDER — ASPIRIN EC 81 MG PO TBEC
81.0000 mg | DELAYED_RELEASE_TABLET | Freq: Every day | ORAL | Status: DC
  Administered 2014-11-30 – 2014-12-15 (×16): 81 mg via ORAL
  Filled 2014-11-29 (×19): qty 1

## 2014-11-29 MED ORDER — ROCURONIUM BROMIDE 100 MG/10ML IV SOLN
INTRAVENOUS | Status: DC | PRN
  Administered 2014-11-29: 50 mg via INTRAVENOUS

## 2014-11-29 MED ORDER — DIPHENHYDRAMINE HCL 50 MG/ML IJ SOLN
12.5000 mg | Freq: Four times a day (QID) | INTRAMUSCULAR | Status: DC | PRN
  Filled 2014-11-29: qty 1

## 2014-11-29 MED ORDER — HYDROMORPHONE HCL 1 MG/ML IJ SOLN
INTRAMUSCULAR | Status: AC
  Administered 2014-11-29: 0.5 mg via INTRAVENOUS
  Filled 2014-11-29: qty 1

## 2014-11-29 MED ORDER — PROPOFOL 10 MG/ML IV BOLUS
INTRAVENOUS | Status: AC
  Filled 2014-11-29: qty 20

## 2014-11-29 MED ORDER — BISACODYL 5 MG PO TBEC
10.0000 mg | DELAYED_RELEASE_TABLET | Freq: Every day | ORAL | Status: DC
  Administered 2014-11-30 – 2014-12-15 (×15): 10 mg via ORAL
  Filled 2014-11-29 (×16): qty 2

## 2014-11-29 MED ORDER — SENNOSIDES-DOCUSATE SODIUM 8.6-50 MG PO TABS
1.0000 | ORAL_TABLET | Freq: Every day | ORAL | Status: DC
  Administered 2014-11-29 – 2014-12-14 (×15): 1 via ORAL
  Filled 2014-11-29 (×17): qty 1

## 2014-11-29 MED ORDER — SUCCINYLCHOLINE CHLORIDE 20 MG/ML IJ SOLN
INTRAMUSCULAR | Status: AC
  Filled 2014-11-29: qty 1

## 2014-11-29 MED ORDER — STERILE WATER FOR INJECTION IJ SOLN
INTRAMUSCULAR | Status: AC
  Filled 2014-11-29: qty 20

## 2014-11-29 MED ORDER — HYDROMORPHONE HCL 1 MG/ML IJ SOLN
INTRAMUSCULAR | Status: AC
  Filled 2014-11-29: qty 1

## 2014-11-29 MED ORDER — LEVALBUTEROL HCL 0.63 MG/3ML IN NEBU
0.6300 mg | INHALATION_SOLUTION | Freq: Four times a day (QID) | RESPIRATORY_TRACT | Status: DC
  Administered 2014-11-29 – 2014-12-07 (×32): 0.63 mg via RESPIRATORY_TRACT
  Filled 2014-11-29 (×53): qty 3

## 2014-11-29 MED ORDER — LACTATED RINGERS IV SOLN
INTRAVENOUS | Status: DC | PRN
  Administered 2014-11-29: 08:00:00 via INTRAVENOUS

## 2014-11-29 MED ORDER — BUPIVACAINE 0.5 % ON-Q PUMP SINGLE CATH 400 ML
400.0000 mL | INJECTION | Status: DC
  Filled 2014-11-29: qty 400

## 2014-11-29 MED ORDER — ALBUTEROL SULFATE HFA 108 (90 BASE) MCG/ACT IN AERS: 2.0000 | INHALATION_SPRAY | RESPIRATORY_TRACT | Status: DC | PRN

## 2014-11-29 MED ORDER — GLYCOPYRROLATE 0.2 MG/ML IJ SOLN
INTRAMUSCULAR | Status: AC
  Filled 2014-11-29: qty 3

## 2014-11-29 MED ORDER — MIDAZOLAM HCL 2 MG/2ML IJ SOLN
INTRAMUSCULAR | Status: AC
  Filled 2014-11-29: qty 2

## 2014-11-29 MED ORDER — NALOXONE HCL 0.4 MG/ML IJ SOLN
0.4000 mg | INTRAMUSCULAR | Status: DC | PRN
  Filled 2014-11-29: qty 1

## 2014-11-29 MED ORDER — ONDANSETRON HCL 4 MG/2ML IJ SOLN
INTRAMUSCULAR | Status: AC
  Filled 2014-11-29: qty 2

## 2014-11-29 MED ORDER — VECURONIUM BROMIDE 10 MG IV SOLR
INTRAVENOUS | Status: AC
  Filled 2014-11-29: qty 10

## 2014-11-29 MED ORDER — OXYCODONE-ACETAMINOPHEN 10-325 MG PO TABS: 2.0000 | ORAL_TABLET | Freq: Four times a day (QID) | ORAL | Status: DC | PRN

## 2014-11-29 MED ORDER — NEOSTIGMINE METHYLSULFATE 10 MG/10ML IV SOLN
INTRAVENOUS | Status: AC
  Filled 2014-11-29: qty 1

## 2014-11-29 MED ORDER — ONDANSETRON HCL 4 MG/2ML IJ SOLN: 4.0000 mg | Freq: Four times a day (QID) | INTRAMUSCULAR | Status: DC | PRN

## 2014-11-29 MED ORDER — ROCURONIUM BROMIDE 50 MG/5ML IV SOLN
INTRAVENOUS | Status: AC
  Filled 2014-11-29: qty 1

## 2014-11-29 MED ORDER — LIDOCAINE HCL (CARDIAC) 20 MG/ML IV SOLN
INTRAVENOUS | Status: DC | PRN
  Administered 2014-11-29: 50 mg via INTRAVENOUS
  Administered 2014-11-29: 30 mg via INTRAVENOUS

## 2014-11-29 MED ORDER — VECURONIUM BROMIDE 10 MG IV SOLR
INTRAVENOUS | Status: DC | PRN
  Administered 2014-11-29: 1 mg via INTRAVENOUS
  Administered 2014-11-29: 3 mg via INTRAVENOUS
  Administered 2014-11-29: 1 mg via INTRAVENOUS
  Administered 2014-11-29: 3 mg via INTRAVENOUS
  Administered 2014-11-29 (×2): 1 mg via INTRAVENOUS
  Administered 2014-11-29: 2 mg via INTRAVENOUS
  Administered 2014-11-29 (×2): 1 mg via INTRAVENOUS
  Administered 2014-11-29: 2 mg via INTRAVENOUS
  Administered 2014-11-29: 1 mg via INTRAVENOUS

## 2014-11-29 MED ORDER — 0.9 % SODIUM CHLORIDE (POUR BTL) OPTIME
TOPICAL | Status: DC | PRN
  Administered 2014-11-29: 1000 mL

## 2014-11-29 MED ORDER — ACETAMINOPHEN 500 MG PO TABS
1000.0000 mg | ORAL_TABLET | Freq: Four times a day (QID) | ORAL | Status: AC
  Administered 2014-11-29 – 2014-12-04 (×8): 1000 mg via ORAL
  Filled 2014-11-29 (×19): qty 2

## 2014-11-29 MED ORDER — DEXTROSE 5 % IV SOLN
1.5000 g | Freq: Two times a day (BID) | INTRAVENOUS | Status: AC
  Administered 2014-11-29 – 2014-11-30 (×2): 1.5 g via INTRAVENOUS
  Filled 2014-11-29 (×2): qty 1.5

## 2014-11-29 MED ORDER — DEXAMETHASONE SODIUM PHOSPHATE 10 MG/ML IJ SOLN
INTRAMUSCULAR | Status: DC | PRN
  Administered 2014-11-29: 5 mg via INTRAVENOUS

## 2014-11-29 MED ORDER — PHENYLEPHRINE HCL 10 MG/ML IJ SOLN
10.0000 mg | INTRAVENOUS | Status: DC | PRN
  Administered 2014-11-29: 10 ug/min via INTRAVENOUS

## 2014-11-29 MED ORDER — ALBUTEROL SULFATE (2.5 MG/3ML) 0.083% IN NEBU: 2.5000 mg | INHALATION_SOLUTION | RESPIRATORY_TRACT | Status: DC | PRN

## 2014-11-29 MED ORDER — OXYCODONE-ACETAMINOPHEN 5-325 MG PO TABS
1.0000 | ORAL_TABLET | Freq: Four times a day (QID) | ORAL | Status: DC | PRN
  Administered 2014-11-29 – 2014-12-12 (×41): 1 via ORAL
  Filled 2014-11-29 (×43): qty 1

## 2014-11-29 MED ORDER — METOCLOPRAMIDE HCL 5 MG/ML IJ SOLN
10.0000 mg | Freq: Four times a day (QID) | INTRAMUSCULAR | Status: AC
  Administered 2014-11-29 – 2014-11-30 (×4): 10 mg via INTRAVENOUS
  Filled 2014-11-29 (×4): qty 2

## 2014-11-29 MED ORDER — NEOSTIGMINE METHYLSULFATE 10 MG/10ML IV SOLN
INTRAVENOUS | Status: AC
  Filled 2014-11-29: qty 2

## 2014-11-29 MED ORDER — STERILE WATER FOR INJECTION IJ SOLN
INTRAMUSCULAR | Status: AC
  Filled 2014-11-29: qty 10

## 2014-11-29 MED ORDER — ARTIFICIAL TEARS OP OINT
TOPICAL_OINTMENT | OPHTHALMIC | Status: DC | PRN
  Administered 2014-11-29: 1 via OPHTHALMIC

## 2014-11-29 MED ORDER — ALBUTEROL SULFATE HFA 108 (90 BASE) MCG/ACT IN AERS
INHALATION_SPRAY | RESPIRATORY_TRACT | Status: AC
  Filled 2014-11-29: qty 6.7

## 2014-11-29 MED ORDER — MIDAZOLAM HCL 5 MG/5ML IJ SOLN
INTRAMUSCULAR | Status: DC | PRN
  Administered 2014-11-29 (×2): 1 mg via INTRAVENOUS

## 2014-11-29 MED ORDER — HEPARIN SODIUM (PORCINE) 1000 UNIT/ML IJ SOLN
INTRAMUSCULAR | Status: AC
  Filled 2014-11-29: qty 4

## 2014-11-29 MED ORDER — HYDROMORPHONE HCL 1 MG/ML IJ SOLN
0.2500 mg | INTRAMUSCULAR | Status: DC | PRN
  Administered 2014-11-29 (×4): 0.5 mg via INTRAVENOUS

## 2014-11-29 MED ORDER — CHOLECALCIFEROL 100 MCG (4000 UT) PO CAPS: 1.0000 | ORAL_CAPSULE | Freq: Every day | ORAL | Status: DC

## 2014-11-29 MED ORDER — DIPHENHYDRAMINE HCL 50 MG/ML IJ SOLN
12.5000 mg | Freq: Four times a day (QID) | INTRAMUSCULAR | Status: DC | PRN
  Filled 2014-11-29: qty 0.25

## 2014-11-29 MED ORDER — PROPOFOL 10 MG/ML IV BOLUS
INTRAVENOUS | Status: DC | PRN
  Administered 2014-11-29: 30 mg via INTRAVENOUS
  Administered 2014-11-29: 100 mg via INTRAVENOUS
  Administered 2014-11-29: 20 mg via INTRAVENOUS
  Administered 2014-11-29: 50 mg via INTRAVENOUS
  Administered 2014-11-29: 10 mg via INTRAVENOUS

## 2014-11-29 SURGICAL SUPPLY — 102 items
APPLICATOR TIP COSEAL (VASCULAR PRODUCTS) IMPLANT
APPLICATOR TIP EXT COSEAL (VASCULAR PRODUCTS) IMPLANT
BENZOIN TINCTURE PRP APPL 2/3 (GAUZE/BANDAGES/DRESSINGS) ×5 IMPLANT
BLADE SURG 11 STRL SS (BLADE) ×5 IMPLANT
BRUSH CYTOL CELLEBRITY 1.5X140 (MISCELLANEOUS) IMPLANT
CANISTER SUCTION 2500CC (MISCELLANEOUS) ×5 IMPLANT
CATH KIT ON Q 5IN SLV (PAIN MANAGEMENT) ×5 IMPLANT
CATH THORACIC 28FR (CATHETERS) IMPLANT
CATH THORACIC 36FR (CATHETERS) IMPLANT
CATH THORACIC 36FR RT ANG (CATHETERS) IMPLANT
CLIP TI MEDIUM 24 (CLIP) ×5 IMPLANT
CLIP TI MEDIUM 6 (CLIP) ×5 IMPLANT
CLSR STERI-STRIP ANTIMIC 1/2X4 (GAUZE/BANDAGES/DRESSINGS) ×5 IMPLANT
CONN ST 1/4X3/8  BEN (MISCELLANEOUS) ×1
CONN ST 1/4X3/8 BEN (MISCELLANEOUS) ×4 IMPLANT
CONT SPEC 4OZ CLIKSEAL STRL BL (MISCELLANEOUS) ×40 IMPLANT
COVER TABLE BACK 60X90 (DRAPES) ×5 IMPLANT
CUTTER ECHEON FLEX ENDO 45 340 (ENDOMECHANICALS) ×5 IMPLANT
DERMABOND ADVANCED (GAUZE/BANDAGES/DRESSINGS) ×1
DERMABOND ADVANCED .7 DNX12 (GAUZE/BANDAGES/DRESSINGS) ×4 IMPLANT
DRAIN CHANNEL 28F RND 3/8 FF (WOUND CARE) IMPLANT
DRAIN CHANNEL 32F RND 10.7 FF (WOUND CARE) IMPLANT
DRAPE LAPAROSCOPIC ABDOMINAL (DRAPES) ×5 IMPLANT
DRAPE WARM FLUID 44X44 (DRAPE) IMPLANT
DRILL BIT 7/64X5 (BIT) ×5 IMPLANT
DRSG TEGADERM 4X4.75 (GAUZE/BANDAGES/DRESSINGS) ×5 IMPLANT
ELECT BLADE 4.0 EZ CLEAN MEGAD (MISCELLANEOUS) ×5
ELECT BLADE 6.5 EXT (BLADE) ×10 IMPLANT
ELECT REM PT RETURN 9FT ADLT (ELECTROSURGICAL) ×5
ELECTRODE BLDE 4.0 EZ CLN MEGD (MISCELLANEOUS) ×4 IMPLANT
ELECTRODE REM PT RTRN 9FT ADLT (ELECTROSURGICAL) ×4 IMPLANT
FORCEPS BIOP RJ4 1.8 (CUTTING FORCEPS) IMPLANT
GAUZE SPONGE 4X4 12PLY STRL (GAUZE/BANDAGES/DRESSINGS) ×5 IMPLANT
GLOVE BIO SURGEON STRL SZ 6 (GLOVE) ×5 IMPLANT
GLOVE BIO SURGEON STRL SZ 6.5 (GLOVE) ×50 IMPLANT
GLOVE BIO SURGEON STRL SZ7.5 (GLOVE) ×15 IMPLANT
GLOVE BIOGEL PI IND STRL 6.5 (GLOVE) ×4 IMPLANT
GLOVE BIOGEL PI INDICATOR 6.5 (GLOVE) ×1
GOWN STRL REUS W/ TWL LRG LVL3 (GOWN DISPOSABLE) ×32 IMPLANT
GOWN STRL REUS W/TWL LRG LVL3 (GOWN DISPOSABLE) ×8
HANDLE STAPLE ENDO GIA SHORT (STAPLE) ×1
KIT BASIN OR (CUSTOM PROCEDURE TRAY) ×5 IMPLANT
KIT CLEAN ENDO COMPLIANCE (KITS) ×5 IMPLANT
KIT ROOM TURNOVER OR (KITS) ×5 IMPLANT
KIT SUCTION CATH 14FR (SUCTIONS) ×5 IMPLANT
MARKER SKIN DUAL TIP RULER LAB (MISCELLANEOUS) ×5 IMPLANT
NEEDLE BIOPSY TRANSBRONCH 21G (NEEDLE) IMPLANT
NS IRRIG 1000ML POUR BTL (IV SOLUTION) ×15 IMPLANT
OIL SILICONE PENTAX (PARTS (SERVICE/REPAIRS)) ×5 IMPLANT
PACK CHEST (CUSTOM PROCEDURE TRAY) ×5 IMPLANT
PAD ARMBOARD 7.5X6 YLW CONV (MISCELLANEOUS) ×15 IMPLANT
PASSER SUT SWANSON 36MM LOOP (INSTRUMENTS) ×5 IMPLANT
POUCH ENDO CATCH II 15MM (MISCELLANEOUS) ×5 IMPLANT
RELOAD GOLD ECHELON 45 (STAPLE) ×30 IMPLANT
RELOAD GREEN ECHELON 45 (STAPLE) ×15 IMPLANT
RELOAD TRI 2.0 60 XTHK VAS SUL (STAPLE) ×5 IMPLANT
SCISSORS LAP 5X35 DISP (ENDOMECHANICALS) IMPLANT
SEALANT PROGEL (MISCELLANEOUS) ×5 IMPLANT
SEALANT SURG COSEAL 4ML (VASCULAR PRODUCTS) IMPLANT
SEALANT SURG COSEAL 8ML (VASCULAR PRODUCTS) IMPLANT
SOLUTION ANTI FOG 6CC (MISCELLANEOUS) ×10 IMPLANT
SPONGE GAUZE 4X4 12PLY STER LF (GAUZE/BANDAGES/DRESSINGS) ×5 IMPLANT
STAPLE RELOAD 2.5MM WHITE (STAPLE) ×35 IMPLANT
STAPLER ENDO GIA 12MM SHORT (STAPLE) ×4 IMPLANT
STAPLER VASCULAR ECHELON 35 (CUTTER) ×5 IMPLANT
STRIP CLOSURE SKIN 1/2X4 (GAUZE/BANDAGES/DRESSINGS) ×5 IMPLANT
SUT PROLENE 3 0 SH DA (SUTURE) IMPLANT
SUT PROLENE 4 0 RB 1 (SUTURE)
SUT PROLENE 4-0 RB1 .5 CRCL 36 (SUTURE) IMPLANT
SUT SILK  1 MH (SUTURE) ×3
SUT SILK 1 MH (SUTURE) ×12 IMPLANT
SUT SILK 1 TIES 10X30 (SUTURE) IMPLANT
SUT SILK 2 0 SH (SUTURE) IMPLANT
SUT SILK 2 0SH CR/8 30 (SUTURE) ×5 IMPLANT
SUT SILK 3 0SH CR/8 30 (SUTURE) ×10 IMPLANT
SUT STEEL 1 (SUTURE) IMPLANT
SUT VIC AB 1 CTX 18 (SUTURE) ×5 IMPLANT
SUT VIC AB 1 CTX 36 (SUTURE)
SUT VIC AB 1 CTX36XBRD ANBCTR (SUTURE) IMPLANT
SUT VIC AB 2-0 CT1 27 (SUTURE) ×1
SUT VIC AB 2-0 CT1 TAPERPNT 27 (SUTURE) ×4 IMPLANT
SUT VIC AB 2-0 CTX 36 (SUTURE) ×5 IMPLANT
SUT VIC AB 2-0 UR6 27 (SUTURE) IMPLANT
SUT VIC AB 3-0 SH 8-18 (SUTURE) IMPLANT
SUT VIC AB 3-0 X1 27 (SUTURE) ×5 IMPLANT
SUT VICRYL 0 UR6 27IN ABS (SUTURE) IMPLANT
SUT VICRYL 2 TP 1 (SUTURE) ×5 IMPLANT
SWAB COLLECTION DEVICE MRSA (MISCELLANEOUS) IMPLANT
SYR 20ML ECCENTRIC (SYRINGE) ×5 IMPLANT
SYSTEM SAHARA CHEST DRAIN ATS (WOUND CARE) ×5 IMPLANT
TAPE CLOTH SURG 4X10 WHT LF (GAUZE/BANDAGES/DRESSINGS) ×5 IMPLANT
TAPE UMBILICAL COTTON 1/8X30 (MISCELLANEOUS) ×5 IMPLANT
TIP APPLICATOR SPRAY EXTEND 16 (VASCULAR PRODUCTS) ×5 IMPLANT
TOWEL OR 17X24 6PK STRL BLUE (TOWEL DISPOSABLE) ×10 IMPLANT
TOWEL OR 17X26 10 PK STRL BLUE (TOWEL DISPOSABLE) ×10 IMPLANT
TRAP SPECIMEN MUCOUS 40CC (MISCELLANEOUS) ×10 IMPLANT
TRAY FOLEY CATH 16FRSI W/METER (SET/KITS/TRAYS/PACK) ×5 IMPLANT
TROCAR XCEL BLUNT TIP 100MML (ENDOMECHANICALS) IMPLANT
TUBE ANAEROBIC SPECIMEN COL (MISCELLANEOUS) IMPLANT
TUBE CONNECTING 20X1/4 (TUBING) ×10 IMPLANT
TUNNELER SHEATH ON-Q 11GX8 DSP (PAIN MANAGEMENT) ×5 IMPLANT
WATER STERILE IRR 1000ML POUR (IV SOLUTION) ×10 IMPLANT
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
## 2014-11-29 NOTE — Progress Notes (Signed)
Patient ID: [name] Day, male   DOB: [birth year], 69 y.o.   MRN: [id number] EVENING ROUNDS NOTE :     [address]       [place] [address]             [phone]                 Day of Surgery Procedure(s) (LRB): VIDEO BRONCHOSCOPY (N/A) VIDEO ASSISTED THORACOSCOPY (VATS)/ LOBECTOMY with on-Q pain pump placement MINI/LIMITED THORACOTOMY (Right) LOBECTOMY; right upper lobe (Right) LYMPH NODE DISSECTION (Right)  Total Length of Stay:  LOS: 0 days  BP 128/67 mmHg  Pulse 95  Temp(Src) 97.2 F (36.2 C) (Oral)  Resp 26  Ht '5\' 5"'$  (1.651 m)  Wt 200 lb 8 oz (90.946 kg)  BMI 33.36 kg/m2  SpO2 92%  .Intake/Output      05/17 0701 - 05/18 0700 05/18 0701 - 05/19 0700   I.V. (mL/kg)  2000 (22)   IV Piggyback  500   Total Intake(mL/kg)  2500 (27.5)   Urine (mL/kg/hr)  425 (0.4)   Blood  500 (0.5)   Chest Tube  110 (0.1)   Total Output   1035   Net   +1465          . 0.45 % NaCl with KCl 20 mEq / L       Lab Results  Component Value Date   WBC 9.1 11/24/2014   HGB 16.0 11/24/2014   HCT 46.1 11/24/2014   PLT 270 11/24/2014   GLUCOSE 70 11/24/2014   ALT 22 11/24/2014   AST 25 11/24/2014   NA 138 11/24/2014   K 4.2 11/24/2014   CL 102 11/24/2014   CREATININE 1.04 11/24/2014   BUN 17 11/24/2014   CO2 22 11/24/2014   INR 1.01 11/24/2014   Extubated, on pca and on q, air 2+ present, not bleeding , chest xray post op looks ok  Grace Isaac MD  Beeper [phone] Office [phone] 11/29/2014 6:55 PM

## 2014-11-29 NOTE — Transfer of Care (Signed)
Immediate Anesthesia Transfer of Care Note  Patient: [name] Day  Procedure(s) Performed: Procedure(s): VIDEO BRONCHOSCOPY (N/A) VIDEO ASSISTED THORACOSCOPY (VATS)/ LOBECTOMY with on-Q pain pump placement MINI/LIMITED THORACOTOMY (Right) LOBECTOMY; right upper lobe (Right) LYMPH NODE DISSECTION (Right)  Patient Location: PACU  Anesthesia Type:General  Level of Consciousness: awake, alert  and oriented  Airway & Oxygen Therapy: Patient Spontanous Breathing and Patient connected to face mask oxygen  Post-op Assessment: Report given to RN, Post -op Vital signs reviewed and stable and Patient moving all extremities X 4  Post vital signs: Reviewed and stable  Last Vitals:  Filed Vitals:   11/29/14 0658  BP: 141/84  Pulse: 76  Temp: 37 C  Resp: 18    Complications: No apparent anesthesia complications

## 2014-11-29 NOTE — Anesthesia Procedure Notes (Addendum)
Procedure Name: Intubation Date/Time: 11/29/2014 8:39 AM Performed by: Judeth Cornfield T Pre-anesthesia Checklist: Patient identified, Emergency Drugs available, Suction available, Patient being monitored and Timeout performed Patient Re-evaluated:Patient Re-evaluated prior to inductionOxygen Delivery Method: Circle system utilized Preoxygenation: Pre-oxygenation with 100% oxygen Intubation Type: IV induction Ventilation: Mask ventilation without difficulty and Oral airway inserted - appropriate to patient size Laryngoscope Size: Mac and 3 Grade View: Grade I Tube type: Oral Tube size: 8.5 mm Number of attempts: 1 Airway Equipment and Method: Stylet Placement Confirmation: ETT inserted through vocal cords under direct vision,  positive ETCO2,  CO2 detector and breath sounds checked- equal and bilateral Secured at: 23 cm Tube secured with: Tape Dental Injury: Teeth and Oropharynx as per pre-operative assessment    Date/Time: 11/29/2014 8:57 AM Performed by: Judeth Cornfield T Pre-anesthesia Checklist: Patient identified, Emergency Drugs available, Suction available, Patient being monitored and Timeout performed Patient Re-evaluated:Patient Re-evaluated prior to inductionOxygen Delivery Method: Circle system utilized Preoxygenation: Pre-oxygenation with 100% oxygen Grade View: Grade I Tube type: Oral Endobronchial tube: Left, EBT position confirmed by fiberoptic bronchoscope, EBT position confirmed by auscultation and Double lumen EBT and 39 Fr Number of attempts: 1 Airway Equipment and Method: Stylet Placement Confirmation: ETT inserted through vocal cords under direct vision,  breath sounds checked- equal and bilateral,  positive ETCO2 and CO2 detector Tube secured with: Tape Dental Injury: Teeth and Oropharynx as per pre-operative assessment

## 2014-11-29 NOTE — Anesthesia Preprocedure Evaluation (Addendum)
Anesthesia Evaluation  Patient identified by MRN, date of birth, ID band Patient awake    Reviewed: Allergy & Precautions, H&P , NPO status , Patient's Chart, lab work & pertinent test results  Airway Mallampati: II  TM Distance: >3 FB     Dental no notable dental hx. (+) Poor Dentition   Pulmonary COPD COPD inhaler, Current Smoker,    Pulmonary exam normal + decreased breath sounds      Cardiovascular hypertension, Pt. on medications Rhythm:Regular     Neuro/Psych negative neurological ROS  negative psych ROS   GI/Hepatic negative GI ROS, Neg liver ROS,   Endo/Other  diabetes, Type 2, Oral Hypoglycemic Agents  Renal/GU negative Renal ROS  negative genitourinary   Musculoskeletal  (+) Arthritis -, Osteoarthritis,    Abdominal   Peds  Hematology negative hematology ROS (+)   Anesthesia Other Findings   Reproductive/Obstetrics negative OB ROS                           Anesthesia Physical Anesthesia Plan  ASA: III  Anesthesia Plan: General   Post-op Pain Management:    Induction: Intravenous  Airway Management Planned: Double Lumen EBT  Additional Equipment: Arterial line  Intra-op Plan:   Post-operative Plan: Extubation in OR and Possible Post-op intubation/ventilation  Informed Consent: I have reviewed the patients History and Physical, chart, labs and discussed the procedure including the risks, benefits and alternatives for the proposed anesthesia with the patient or authorized representative who has indicated his/her understanding and acceptance.   Dental advisory given  Plan Discussed with: CRNA  Anesthesia Plan Comments:         Anesthesia Quick Evaluation

## 2014-11-29 NOTE — Anesthesia Postprocedure Evaluation (Signed)
  Anesthesia Post-op Note  Patient: [name] Day  Procedure(s) Performed: Procedure(s): VIDEO BRONCHOSCOPY (N/A) VIDEO ASSISTED THORACOSCOPY (VATS)/ LOBECTOMY with on-Q pain pump placement MINI/LIMITED THORACOTOMY (Right) LOBECTOMY; right upper lobe (Right) LYMPH NODE DISSECTION (Right)  Patient Location: PACU  Anesthesia Type:General  Level of Consciousness: awake, alert  and oriented  Airway and Oxygen Therapy: Patient Spontanous Breathing and Patient connected to nasal cannula oxygen  Post-op Pain: mild  Post-op Assessment: Post-op Vital signs reviewed, Patient's Cardiovascular Status Stable, Respiratory Function Stable, Patent Airway and Pain level controlled  Post-op Vital Signs: stable  Last Vitals:  Filed Vitals:   11/29/14 1800  BP: 128/67  Pulse: 95  Temp:   Resp: 26    Complications: No apparent anesthesia complications

## 2014-11-29 NOTE — Brief Op Note (Addendum)
      [address]       [place], [address]             [phone]      11/29/2014  4:04 PM  PATIENT:  [name] Day  69 y.o. male  PRE-OPERATIVE DIAGNOSIS:  lung mass  POST-OPERATIVE DIAGNOSIS:  right lung mass  PROCEDURE:  Procedure(s):  VIDEO BRONCHOSCOPY (N/A)  VIDEO ASSISTED THORACOSCOPY (VATS)/ -Right Upper Lobectomy -Lymph Node Dissection  SURGEON:  Surgeon(s) and Role:    * Grace Isaac, MD - Primary  PHYSICIAN ASSISTANT: Erin Barrett PA-C  ANESTHESIA:   general  EBL:  Total I/O In: 2200 [I.V.:1700; IV Piggyback:500] Out: 810 [Urine:310; Blood:500]  BLOOD ADMINISTERED:none  DRAINS: 28 Blake, 28 Straight   LOCAL MEDICATIONS USED:  MARCAINE     SPECIMEN:  Source of Specimen:  Right Upper Lobe Lung, Lymph Nodes  DISPOSITION OF SPECIMEN:  PATHOLOGY  COUNTS:  YES   DICTATION: .Dragon Dictation  PLAN OF CARE: Admit to inpatient   PATIENT DISPOSITION:  PACU - hemodynamically stable.   Delay start of Pharmacological VTE agent (>24hrs) due to surgical blood loss or risk of bleeding: yes

## 2014-11-30 ENCOUNTER — Inpatient Hospital Stay (HOSPITAL_COMMUNITY): Payer: Medicare Other

## 2014-11-30 ENCOUNTER — Encounter (HOSPITAL_COMMUNITY): Payer: Self-pay | Admitting: Cardiothoracic Surgery

## 2014-11-30 LAB — BLOOD GAS, ARTERIAL

## 2014-11-30 LAB — BASIC METABOLIC PANEL
Anion gap: 9 (ref 5–15)
BUN: 14 mg/dL (ref 6–20)
CHLORIDE: 102 mmol/L (ref 101–111)
CO2: 24 mmol/L (ref 22–32)
Calcium: 8.4 mg/dL — ABNORMAL LOW (ref 8.9–10.3)
Creatinine, Ser: 0.95 mg/dL (ref 0.61–1.24)
GFR calc Af Amer: >60 mL/min (ref >60)
GLUCOSE: 131 mg/dL — AB (ref 65–99)
POTASSIUM: 4.4 mmol/L (ref 3.5–5.1)
SODIUM: 135 mmol/L (ref 135–145)

## 2014-11-30 LAB — GLUCOSE, CAPILLARY
Glucose-Capillary: 116 mg/dL — ABNORMAL HIGH (ref 65–99)
Glucose-Capillary: 143 mg/dL — ABNORMAL HIGH (ref 65–99)
Glucose-Capillary: 116 mg/dL — ABNORMAL HIGH (ref 65–99)
Glucose-Capillary: 133 mg/dL — ABNORMAL HIGH (ref 65–99)
Glucose-Capillary: 112 mg/dL — ABNORMAL HIGH (ref 65–99)

## 2014-11-30 LAB — POCT I-STAT 3, ART BLOOD GAS (G3+)
Acid-base deficit: 1 mmol/L (ref 0.0–2.0)
Bicarbonate: 23.4 mEq/L (ref 20.0–24.0)
O2 Saturation: 97 %
Patient temperature: 98.8
TCO2: 25 mmol/L (ref 0–100)
pCO2 arterial: 37.9 mmHg (ref 35.0–45.0)
pH, Arterial: 7.398 (ref 7.350–7.450)
pO2, Arterial: 87 mmHg (ref 80.0–100.0)

## 2014-11-30 LAB — CBC
HEMATOCRIT: 40.2 % (ref 39.0–52.0)
Hemoglobin: 13.1 g/dL (ref 13.0–17.0)
MCH: 31.1 pg (ref 26.0–34.0)
MCHC: 32.6 g/dL (ref 30.0–36.0)
MCV: 95.5 fL (ref 78.0–100.0)
PLATELETS: 205 10*3/uL (ref 150–400)
RBC: 4.21 MIL/uL — AB (ref 4.22–5.81)
RDW: 13.4 % (ref 11.5–15.5)
WBC: 13.7 10*3/uL — AB (ref 4.0–10.5)

## 2014-11-30 MED ORDER — KETOROLAC TROMETHAMINE 15 MG/ML IJ SOLN
15.0000 mg | Freq: Four times a day (QID) | INTRAMUSCULAR | Status: AC
  Administered 2014-11-30 (×4): 15 mg via INTRAVENOUS
  Filled 2014-11-30 (×4): qty 1

## 2014-11-30 NOTE — Progress Notes (Addendum)
TCTS DAILY ICU PROGRESS NOTE                   [address]            ,[place] [address]          [phone]   1 Day Post-Op Procedure(s) (LRB): VIDEO BRONCHOSCOPY (N/A) VIDEO ASSISTED THORACOSCOPY (VATS)/ LOBECTOMY with on-Q pain pump placement MINI/LIMITED THORACOTOMY (Right) LOBECTOMY; right upper lobe (Right) LYMPH NODE DISSECTION (Right)  Total Length of Stay:  LOS: 1 day   Subjective: C/o severe pain.  Back on his home narcotic regimen as well as maxing out on Fentanyl PCA.    Objective: Vital signs in last 24 hours: Temp:  [97.2 F (36.2 C)-99.3 F (37.4 C)] 98.2 F (36.8 C) (05/19 0700) Pulse Rate:  [76-104] 92 (05/19 0600) Cardiac Rhythm:  [-] Normal sinus rhythm (05/19 0600) Resp:  [17-34] 18 (05/19 0600) BP: (101-145)/(42-80) 131/80 mmHg (05/19 0600) SpO2:  [92 %-97 %] 95 % (05/19 0754) Arterial Line BP: (64-167)/(58-132) 160/70 mmHg (05/19 0600) Weight:  [186 lb 1.1 oz (84.4 kg)] 186 lb 1.1 oz (84.4 kg) (05/19 0500)  Filed Weights   11/29/14 0658 11/30/14 0500  Weight: 200 lb 8 oz (90.946 kg) 186 lb 1.1 oz (84.4 kg)    Weight change: -14 lb 6.9 oz (-6.546 kg)   Hemodynamic parameters for last 24 hours:    Intake/Output from previous day: 05/18 0701 - 05/19 0700 In: 4750 [P.O.:350; I.V.:3600; IV Piggyback:800] Out: 2550 [Urine:1640; Blood:500; Chest Tube:410]  Intake/Output this shift:    Current Meds: Scheduled Meds: . acetaminophen  1,000 mg Oral 4 times per day   Or  . acetaminophen (TYLENOL) oral liquid 160 mg/5 mL  1,000 mg Oral 4 times per day  . antiseptic oral rinse  7 mL Mouth Rinse BID  . aspirin EC  81 mg Oral Daily  . bisacodyl  10 mg Oral Daily  . cholecalciferol  4,000 Units Oral Daily  . fentaNYL   Intravenous 6 times per day  . insulin aspart  0-15 Units Subcutaneous 6 times per day  . levalbuterol  0.63 mg Nebulization Q6H  . metoCLOPramide (REGLAN) injection  10 mg Intravenous 4 times per day  .  mometasone-formoterol  2 puff Inhalation BID  . morphine  60 mg Oral Q12H  . pravastatin  20 mg Oral QHS  . senna-docusate  1 tablet Oral QHS   Continuous Infusions: . 0.45 % NaCl with KCl 20 mEq / L 100 mL/hr at 11/30/14 0750  . nitroGLYCERIN     PRN Meds:.albuterol, diphenhydrAMINE **OR** diphenhydrAMINE, naloxone **AND** sodium chloride, ondansetron (ZOFRAN) IV, oxyCODONE-acetaminophen **AND** oxyCODONE, potassium chloride   Physical Exam: General appearance: alert, cooperative and Complaining of pain Heart: regular rate and rhythm Lungs: Diminished BS in bases bilaterally, poor inspiratory effort Wound: Incision clean and dry, some serosanguinous oozing around CTs Chest tube: 1/7 air leak, increased with cough  Lab Results: CBC: Recent Labs  11/30/14 0430  WBC 13.7*  HGB 13.1  HCT 40.2  PLT 205   BMET:  Recent Labs  11/30/14 0430  NA 135  K 4.4  CL 102  CO2 24  GLUCOSE 131*  BUN 14  CREATININE 0.95  CALCIUM 8.4*    PT/INR: No results for input(s): LABPROT, INR in the last 72 hours. Radiology:  Dg Chest Port 1 View  11/30/2014   CLINICAL DATA:  Lobectomy right lung.  EXAM: PORTABLE CHEST - 1 VIEW  COMPARISON:  11/29/2014.  PET-CT 10/13/2014.  FINDINGS: Two right chest tubes and right IJ line in stable position. Mediastinum is stable. Prior right upper lobectomy. Low lung volumes with basilar atelectasis. Heart size stable. Tiny right apical pneumothorax noted. Right chest wall subcutaneous emphysema noted.  IMPRESSION: 1. Tiny right apical pneumothorax. Right chest tubes in stable position. Stable right chest wall subcutaneous emphysema. 2. Right IJ line in stable position. 3. Right upper lobectomy. Low lung volumes with mild basilar atelectasis. Critical Value/emergent results were called by telephone at the time of interpretation on 11/30/2014 at 7:47 am to nurse Mickel Baas, who verbally acknowledged these results.   Electronically Signed   By: Marcello Moores  Register   On:  11/30/2014 07:49   11/29/2014   CLINICAL DATA:  Post lung lobectomy, COPD, hypertension, non small-cell carcinoma RIGHT lung, diabetes  EXAM: PORTABLE CHEST - 1 VIEW  COMPARISON:  Portable exam 1735 hours compared to preoperative exam of 0545 hours  FINDINGS: New RIGHT jugular central venous catheter tip projecting over proximal SVC.  Pair of RIGHT thoracostomy tubes.  Interval resection of RIGHT upper lobe mass.  Infiltrate RIGHT mid lung.  Subsegmental atelectasis LEFT base.  No pneumothorax.  IMPRESSION: Postsurgical changes of the RIGHT hemi thorax as above.  Subsegmental atelectasis LEFT base.   Electronically Signed   By: Lavonia Dana M.D.   On: 11/29/2014 17:53     Assessment/Plan: S/P Procedure(s) (LRB): VIDEO BRONCHOSCOPY (N/A) VIDEO ASSISTED THORACOSCOPY (VATS)/ LOBECTOMY with on-Q pain pump placement MINI/LIMITED THORACOTOMY (Right) LOBECTOMY; right upper lobe (Right) LYMPH NODE DISSECTION (Right)  CT with air leak, CXR with tiny ptx.  Continue CTs to suction for now.  Pain control seems to be his primary issue.  He is back on his chronic home narcotics, in addition to PCA.  Toradol given last night and did give him some relief, so will give scheduled Toradol for the next 24 hours since his creatinine is stable.  Pulm- IS/nebs/pulm toilet.  OOB to chair, will d/c a-line, routine POD #1 progression.  COLLINS,GINA H 11/30/2014 8:00 AM  With prop high use of narcotic pain difficult to control with narcotic, Toradol helped will give addition doses and watch cr I have seen and examined [name] and agree with the above assessment  and plan.  Grace Isaac MD Beeper [phone] Office [phone] 11/30/2014 8:24 AM

## 2014-11-30 NOTE — Plan of Care (Signed)
Problem: Phase I Progression Outcomes Goal: Pain controlled with appropriate interventions Outcome: Progressing IV Toradol ordered per Dr. Servando Snare in addition to Morphine ER q12h

## 2014-11-30 NOTE — Progress Notes (Signed)
TCTS BRIEF SICU PROGRESS NOTE  1 Day Post-Op  S/P Procedure(s) (LRB): VIDEO BRONCHOSCOPY (N/A) VIDEO ASSISTED THORACOSCOPY (VATS)/ LOBECTOMY with on-Q pain pump placement MINI/LIMITED THORACOTOMY (Right) LOBECTOMY; right upper lobe (Right) LYMPH NODE DISSECTION (Right)   Stable day  Plan: Continue current plan  [name] 11/30/2014 7:35 PM

## 2014-11-30 NOTE — Care Management Note (Signed)
Case Management Note  Patient Details  Name: [name] Day MRN: [id number] Date of Birth: [birth year]  Subjective/Objective:                  Lives at home with wife - independent prior to admission.  Action/Plan:   Expected Discharge Date:                  Expected Discharge Plan:  [place]  In-House Referral:     Discharge planning Services     Post Acute Care Choice:    Choice offered to:     DME Arranged:    DME Agency:     HH Arranged:    Portal Agency:     Status of Service:  In process, will continue to follow  Medicare Important Message Given:    Date Medicare IM Given:    Medicare IM give by:    Date Additional Medicare IM Given:    Additional Medicare Important Message give by:     If discussed at [place] of Stay Meetings, dates discussed:    Additional Comments:  Vergie Living, RN 11/30/2014, 2:03 PM

## 2014-12-01 ENCOUNTER — Inpatient Hospital Stay (HOSPITAL_COMMUNITY): Payer: Medicare Other

## 2014-12-01 LAB — CULTURE, RESPIRATORY W GRAM STAIN

## 2014-12-01 LAB — MYOCARDIAL PERFUSION IMAGING
CSEPED: 4 min
CSEPEDS: 0 s
CSEPPHR: 113 {beats}/min
Estimated workload: 1 METS
MPHR: 152 {beats}/min
Percent HR: 74 %
Rest HR: 85 {beats}/min

## 2014-12-01 LAB — GLUCOSE, CAPILLARY
Glucose-Capillary: 132 mg/dL — ABNORMAL HIGH (ref 65–99)
Glucose-Capillary: 120 mg/dL — ABNORMAL HIGH (ref 65–99)
Glucose-Capillary: 134 mg/dL — ABNORMAL HIGH (ref 65–99)
Glucose-Capillary: 107 mg/dL — ABNORMAL HIGH (ref 65–99)
Glucose-Capillary: 240 mg/dL — ABNORMAL HIGH (ref 65–99)
Glucose-Capillary: 163 mg/dL — ABNORMAL HIGH (ref 65–99)

## 2014-12-01 LAB — COMPREHENSIVE METABOLIC PANEL
ALT: 39 U/L (ref 17–63)
AST: 83 U/L — ABNORMAL HIGH (ref 15–41)
Albumin: 3.1 g/dL — ABNORMAL LOW (ref 3.5–5.0)
Alkaline Phosphatase: 43 U/L (ref 38–126)
Anion gap: 5 (ref 5–15)
BUN: 15 mg/dL (ref 6–20)
CHLORIDE: 103 mmol/L (ref 101–111)
CO2: 27 mmol/L (ref 22–32)
Calcium: 8.6 mg/dL — ABNORMAL LOW (ref 8.9–10.3)
Creatinine, Ser: 0.89 mg/dL (ref 0.61–1.24)
GFR calc Af Amer: >60 mL/min (ref >60)
GFR calc non Af Amer: >60 mL/min (ref >60)
GLUCOSE: 124 mg/dL — AB (ref 65–99)
POTASSIUM: 4.4 mmol/L (ref 3.5–5.1)
SODIUM: 135 mmol/L (ref 135–145)
TOTAL PROTEIN: 6.3 g/dL — AB (ref 6.5–8.1)
Total Bilirubin: 0.7 mg/dL (ref 0.3–1.2)

## 2014-12-01 LAB — CBC
HEMATOCRIT: 38.4 % — AB (ref 39.0–52.0)
Hemoglobin: 12.8 g/dL — ABNORMAL LOW (ref 13.0–17.0)
MCH: 31.8 pg (ref 26.0–34.0)
MCHC: 33.3 g/dL (ref 30.0–36.0)
MCV: 95.3 fL (ref 78.0–100.0)
Platelets: 198 10*3/uL (ref 150–400)
RBC: 4.03 MIL/uL — ABNORMAL LOW (ref 4.22–5.81)
RDW: 13.2 % (ref 11.5–15.5)
WBC: 13.9 10*3/uL — ABNORMAL HIGH (ref 4.0–10.5)

## 2014-12-01 MED ORDER — INSULIN ASPART 100 UNIT/ML ~~LOC~~ SOLN
0.0000 [IU] | Freq: Three times a day (TID) | SUBCUTANEOUS | Status: DC
  Administered 2014-12-01: 5 [IU] via SUBCUTANEOUS
  Administered 2014-12-01: 3 [IU] via SUBCUTANEOUS
  Administered 2014-12-02: 2 [IU] via SUBCUTANEOUS
  Administered 2014-12-02: 3 [IU] via SUBCUTANEOUS
  Administered 2014-12-02: 2 [IU] via SUBCUTANEOUS
  Administered 2014-12-02: 3 [IU] via SUBCUTANEOUS
  Administered 2014-12-04 – 2014-12-05 (×3): 2 [IU] via SUBCUTANEOUS
  Administered 2014-12-06: 3 [IU] via SUBCUTANEOUS
  Administered 2014-12-06 – 2014-12-09 (×5): 2 [IU] via SUBCUTANEOUS
  Administered 2014-12-09 – 2014-12-10 (×2): 3 [IU] via SUBCUTANEOUS
  Administered 2014-12-10 – 2014-12-13 (×7): 2 [IU] via SUBCUTANEOUS
  Administered 2014-12-13 (×2): 3 [IU] via SUBCUTANEOUS
  Administered 2014-12-14 (×2): 2 [IU] via SUBCUTANEOUS

## 2014-12-01 MED ORDER — ENOXAPARIN SODIUM 30 MG/0.3ML ~~LOC~~ SOLN
30.0000 mg | SUBCUTANEOUS | Status: DC
  Administered 2014-12-01: 30 mg via SUBCUTANEOUS
  Filled 2014-12-01 (×2): qty 0.3

## 2014-12-01 MED ORDER — FUROSEMIDE 10 MG/ML IJ SOLN
40.0000 mg | Freq: Once | INTRAMUSCULAR | Status: AC
  Administered 2014-12-01: 40 mg via INTRAVENOUS
  Filled 2014-12-01: qty 4

## 2014-12-01 NOTE — Progress Notes (Addendum)
TCTS DAILY ICU PROGRESS NOTE                   [address]            [place] [address]          [phone]   2 Days Post-Op Procedure(s) (LRB): VIDEO BRONCHOSCOPY (N/A) VIDEO ASSISTED THORACOSCOPY (VATS)/ LOBECTOMY with on-Q pain pump placement MINI/LIMITED THORACOTOMY (Right) LOBECTOMY; right upper lobe (Right) LYMPH NODE DISSECTION (Right)  Total Length of Stay:  LOS: 2 days   Subjective: Pain much better controlled today.  +productive cough.     Objective: Vital signs in last 24 hours: Temp:  [98.4 F (36.9 C)-98.7 F (37.1 C)] 98.4 F (36.9 C) (05/20 0828) Pulse Rate:  [25-174] 135 (05/20 1000) Cardiac Rhythm:  [-] Normal sinus rhythm (05/20 0938) Resp:  [15-27] 23 (05/20 1000) BP: (94-151)/(55-99) 136/76 mmHg (05/20 1000) SpO2:  [91 %-98 %] 91 % (05/20 1000) Weight:  [193 lb 9 oz (87.8 kg)] 193 lb 9 oz (87.8 kg) (05/20 0600)  Filed Weights   11/29/14 0658 11/30/14 0500 12/01/14 0600  Weight: 200 lb 8 oz (90.946 kg) 186 lb 1.1 oz (84.4 kg) 193 lb 9 oz (87.8 kg)    Weight change: 7 lb 7.9 oz (3.4 kg)   Hemodynamic parameters for last 24 hours:    Intake/Output from previous day: 05/19 0701 - 05/20 0700 In: 3310 [P.O.:1560; I.V.:1750] Out: 3762 [Urine:3800; Chest Tube:620]  Intake/Output this shift: Total I/O In: 335 [P.O.:240; I.V.:95] Out: 295 [Urine:245; Chest Tube:50]  Current Meds: Scheduled Meds: . acetaminophen  1,000 mg Oral 4 times per day   Or  . acetaminophen (TYLENOL) oral liquid 160 mg/5 mL  1,000 mg Oral 4 times per day  . antiseptic oral rinse  7 mL Mouth Rinse BID  . aspirin EC  81 mg Oral Daily  . bisacodyl  10 mg Oral Daily  . cholecalciferol  4,000 Units Oral Daily  . fentaNYL   Intravenous 6 times per day  . insulin aspart  0-15 Units Subcutaneous TID AC & HS  . levalbuterol  0.63 mg Nebulization Q6H  . mometasone-formoterol  2 puff Inhalation BID  . morphine  60 mg Oral Q12H  . pravastatin  20 mg Oral QHS    . senna-docusate  1 tablet Oral QHS   Continuous Infusions: . 0.45 % NaCl with KCl 20 mEq / L 10 mL/hr at 12/01/14 1000  . nitroGLYCERIN     PRN Meds:.albuterol, diphenhydrAMINE **OR** diphenhydrAMINE, naloxone **AND** sodium chloride, ondansetron (ZOFRAN) IV, oxyCODONE-acetaminophen **AND** oxyCODONE, potassium chloride   Physical Exam: General appearance: alert, cooperative and no distress Heart: regular rate and rhythm Lungs: Few coarse rhonchi on R Wound: Clean and dry Chest tube: 1/7 air intermittent air leak, slightly increased 2/7 with cough  Lab Results: CBC: Recent Labs  11/30/14 0430 12/01/14 0415  WBC 13.7* 13.9*  HGB 13.1 12.8*  HCT 40.2 38.4*  PLT 205 198   BMET:  Recent Labs  11/30/14 0430 12/01/14 0415  NA 135 135  K 4.4 4.4  CL 102 103  CO2 24 27  GLUCOSE 131* 124*  BUN 14 15  CREATININE 0.95 0.89  CALCIUM 8.4* 8.6*    PT/INR: No results for input(s): LABPROT, INR in the last 72 hours. Radiology:  EXAM: PORTABLE CHEST - 1 VIEW  COMPARISON: Chest x-rays dated 11/30/2014 and 11/29/2014  FINDINGS: Tiny right apical pneumothorax is smaller. Two right-sided chest tubes and central line  appear unchanged in position. Diminished right subcutaneous emphysema.  Left lung is clear. Heart size and vascularity are normal. Right lung bases clear.  IMPRESSION: Reduction of the small right apical pneumothorax. Improved aeration at the left base.   Assessment/Plan: S/P Procedure(s) (LRB): VIDEO BRONCHOSCOPY (N/A) VIDEO ASSISTED THORACOSCOPY (VATS)/ LOBECTOMY with on-Q pain pump placement MINI/LIMITED THORACOTOMY (Right) LOBECTOMY; right upper lobe (Right) LYMPH NODE DISSECTION (Right)  CT with small but improved  air leak, CXR with decreased  ptx. Continue CTs to suction for now.  Pain control improved with addition of Toradol. Continue to monitor.  Pulm- IS/nebs/pulm toilet.  Will add flutter valve.  D/c Foley, decreased IVF, ambulate as  tolerated.  COLLINS,GINA H 12/01/2014 11:13 AM  Air leak decreased  Mild diureses  Leave chest tubes for  Now  I have seen and examined [name] and agree with the above assessment  and plan.  Grace Isaac MD Beeper [phone] Office [phone] 12/01/2014 1:24 PM

## 2014-12-02 ENCOUNTER — Other Ambulatory Visit: Payer: Medicare Other

## 2014-12-02 ENCOUNTER — Inpatient Hospital Stay (HOSPITAL_COMMUNITY): Payer: Medicare Other

## 2014-12-02 LAB — CBC
HCT: 35.7 % — ABNORMAL LOW (ref 39.0–52.0)
Hemoglobin: 11.9 g/dL — ABNORMAL LOW (ref 13.0–17.0)
MCH: 31.7 pg (ref 26.0–34.0)
MCHC: 33.3 g/dL (ref 30.0–36.0)
MCV: 95.2 fL (ref 78.0–100.0)
Platelets: 198 10*3/uL (ref 150–400)
RBC: 3.75 MIL/uL — ABNORMAL LOW (ref 4.22–5.81)
RDW: 13.2 % (ref 11.5–15.5)
WBC: 14.6 10*3/uL — ABNORMAL HIGH (ref 4.0–10.5)

## 2014-12-02 LAB — BASIC METABOLIC PANEL
Anion gap: 7 (ref 5–15)
BUN: 13 mg/dL (ref 6–20)
CO2: 29 mmol/L (ref 22–32)
Calcium: 8.6 mg/dL — ABNORMAL LOW (ref 8.9–10.3)
Chloride: 98 mmol/L — ABNORMAL LOW (ref 101–111)
Creatinine, Ser: 0.77 mg/dL (ref 0.61–1.24)
GFR calc Af Amer: >60 mL/min (ref >60)
GFR calc non Af Amer: >60 mL/min (ref >60)
Glucose, Bld: 137 mg/dL — ABNORMAL HIGH (ref 65–99)
Potassium: 4 mmol/L (ref 3.5–5.1)
Sodium: 134 mmol/L — ABNORMAL LOW (ref 135–145)

## 2014-12-02 LAB — GLUCOSE, CAPILLARY
Glucose-Capillary: 145 mg/dL — ABNORMAL HIGH (ref 65–99)
Glucose-Capillary: 158 mg/dL — ABNORMAL HIGH (ref 65–99)
Glucose-Capillary: 163 mg/dL — ABNORMAL HIGH (ref 65–99)
Glucose-Capillary: 132 mg/dL — ABNORMAL HIGH (ref 65–99)

## 2014-12-02 MED ORDER — NALOXONE HCL 0.4 MG/ML IJ SOLN: 0.4000 mg | INTRAMUSCULAR | Status: DC | PRN

## 2014-12-02 MED ORDER — ENOXAPARIN SODIUM 40 MG/0.4ML ~~LOC~~ SOLN
40.0000 mg | SUBCUTANEOUS | Status: DC
  Administered 2014-12-02 – 2014-12-14 (×13): 40 mg via SUBCUTANEOUS
  Filled 2014-12-02 (×14): qty 0.4

## 2014-12-02 MED ORDER — NICOTINE 21 MG/24HR TD PT24
21.0000 mg | MEDICATED_PATCH | Freq: Every day | TRANSDERMAL | Status: DC
  Administered 2014-12-02 – 2014-12-15 (×14): 21 mg via TRANSDERMAL
  Filled 2014-12-02 (×14): qty 1

## 2014-12-02 MED ORDER — ONDANSETRON HCL 4 MG/2ML IJ SOLN: 4.0000 mg | Freq: Four times a day (QID) | INTRAMUSCULAR | Status: DC | PRN

## 2014-12-02 MED ORDER — HYDROMORPHONE 0.3 MG/ML IV SOLN
INTRAVENOUS | Status: DC
  Administered 2014-12-02: 10:00:00 via INTRAVENOUS
  Administered 2014-12-02: 2.1 mg via INTRAVENOUS
  Administered 2014-12-02: 21:00:00 via INTRAVENOUS
  Administered 2014-12-02: 2.1 mg via INTRAVENOUS
  Administered 2014-12-02: 2.49 mg via INTRAVENOUS
  Administered 2014-12-03: 3 mg via INTRAVENOUS
  Administered 2014-12-03: 1.9 mg via INTRAVENOUS
  Administered 2014-12-03: 0.2 mg via INTRAVENOUS
  Administered 2014-12-03: 1.2 mg via INTRAVENOUS
  Administered 2014-12-03: 1.9 mg via INTRAVENOUS
  Administered 2014-12-03: 2.4 mg via INTRAVENOUS
  Administered 2014-12-04: 4.9 mg via INTRAVENOUS
  Administered 2014-12-04: 3 mg via INTRAVENOUS
  Administered 2014-12-04: 05:00:00 via INTRAVENOUS
  Administered 2014-12-04: 7.8 mg via INTRAVENOUS
  Administered 2014-12-04: 2.4 mg via INTRAVENOUS
  Administered 2014-12-04: 1.2 mg via INTRAVENOUS
  Administered 2014-12-04: 16:00:00 via INTRAVENOUS
  Administered 2014-12-05: 4.5 mg via INTRAVENOUS
  Administered 2014-12-05 (×2): 3 mg via INTRAVENOUS
  Filled 2014-12-02 (×7): qty 25

## 2014-12-02 MED ORDER — DIPHENHYDRAMINE HCL 50 MG/ML IJ SOLN: 12.5000 mg | Freq: Four times a day (QID) | INTRAMUSCULAR | Status: DC | PRN

## 2014-12-02 MED ORDER — SODIUM CHLORIDE 0.9 % IJ SOLN
9.0000 mL | INTRAMUSCULAR | Status: DC | PRN
Start: 2014-12-02 — End: 2014-12-05

## 2014-12-02 MED ORDER — DIPHENHYDRAMINE HCL 12.5 MG/5ML PO ELIX
12.5000 mg | ORAL_SOLUTION | Freq: Four times a day (QID) | ORAL | Status: DC | PRN
  Filled 2014-12-02: qty 5

## 2014-12-02 NOTE — Progress Notes (Signed)
Fentanyl PCA 36m flushed down sink; witnessed by AMarkus Daft RN.

## 2014-12-02 NOTE — Progress Notes (Signed)
      [address]       [place] [address]             [phone]      Feels better this PM  BP 146/86 mmHg  Pulse 126  Temp(Src) 100.3 F (37.9 C) (Oral)  Resp 21  Ht '5\' 5"'$  (1.651 m)  Wt 193 lb 9 oz (87.8 kg)  BMI 32.21 kg/m2  SpO2 92%   Intake/Output Summary (Last 24 hours) at 12/02/14 1946 Last data filed at 12/02/14 1900  Gross per 24 hour  Intake    950 ml  Output   2760 ml  Net  -1810 ml    Still has air leak  Remo Lipps C. Roxan Hockey, MD [facility] [phone]

## 2014-12-02 NOTE — Progress Notes (Signed)
3 Days Post-Op Procedure(s) (LRB): VIDEO BRONCHOSCOPY (N/A) VIDEO ASSISTED THORACOSCOPY (VATS)/ LOBECTOMY with on-Q pain pump placement MINI/LIMITED THORACOTOMY (Right) LOBECTOMY; right upper lobe (Right) LYMPH NODE DISSECTION (Right) Subjective: C/o pain  Objective: Vital signs in last 24 hours: Temp:  [98.4 F (36.9 C)-99.7 F (37.6 C)] 98.6 F (37 C) (05/21 0826) Pulse Rate:  [79-135] 107 (05/21 0900) Cardiac Rhythm:  [-] Sinus tachycardia (05/21 0900) Resp:  [12-28] 20 (05/21 0900) BP: (112-158)/(61-89) 139/89 mmHg (05/21 0900) SpO2:  [91 %-100 %] 95 % (05/21 0900)  Hemodynamic parameters for last 24 hours:    Intake/Output from previous day: 05/20 0701 - 05/21 0700 In: 1015 [P.O.:720; I.V.:295] Out: 4550 [Urine:4220; Chest Tube:330] Intake/Output this shift: Total I/O In: 260 [P.O.:240; I.V.:20] Out: 400 [Urine:400]  General appearance: alert and mild distress Neurologic: intact Heart: regular rate and rhythm Lungs: diminished breath sounds right base and wheezes bilaterally Abdomen: normal findings: soft, non-tender +subcutaneous emphysema, + air leak  Lab Results:  Recent Labs  12/01/14 0415 12/02/14 0430  WBC 13.9* 14.6*  HGB 12.8* 11.9*  HCT 38.4* 35.7*  PLT 198 198   BMET:  Recent Labs  12/01/14 0415 12/02/14 0430  NA 135 134*  K 4.4 4.0  CL 103 98*  CO2 27 29  GLUCOSE 124* 137*  BUN 15 13  CREATININE 0.89 0.77  CALCIUM 8.6* 8.6*    PT/INR: No results for input(s): LABPROT, INR in the last 72 hours. ABG    Component Value Date/Time   PHART TEST WILL BE CREDITED 11/30/2014 0500   HCO3 TEST WILL BE CREDITED 11/30/2014 0500   TCO2 TEST WILL BE CREDITED 11/30/2014 0500   ACIDBASEDEF TEST WILL BE CREDITED 11/30/2014 0500   O2SAT TEST WILL BE CREDITED 11/30/2014 0500   CBG (last 3)   Recent Labs  12/01/14 1714 12/01/14 2138 12/02/14 0823  GLUCAP 240* 163* 145*    Assessment/Plan: S/P Procedure(s) (LRB): VIDEO BRONCHOSCOPY  (N/A) VIDEO ASSISTED THORACOSCOPY (VATS)/ LOBECTOMY with on-Q pain pump placement MINI/LIMITED THORACOTOMY (Right) LOBECTOMY; right upper lobe (Right) LYMPH NODE DISSECTION (Right) -  POD # 3  Still has an air leak and increased subcutaneous emphysema- keep CT to suction Increasing atelectasis of right lung- continue IS, flutter, nebs Pain control remains an issue- is only on a low dose PCA- will change to full dose dilaudid PCA Mobilize as tolerated   LOS: 3 days    Melrose Nakayama 12/02/2014

## 2014-12-02 NOTE — Plan of Care (Signed)
Problem: Phase I Progression Outcomes Goal: Pain controlled with appropriate interventions Outcome: Progressing Pt has chronic pain, pain regimen adjusted today, pt states it is improved over this morning quite a bit

## 2014-12-03 ENCOUNTER — Inpatient Hospital Stay (HOSPITAL_COMMUNITY): Payer: Medicare Other

## 2014-12-03 LAB — CBC
HEMATOCRIT: 37.6 % — AB (ref 39.0–52.0)
Hemoglobin: 12.4 g/dL — ABNORMAL LOW (ref 13.0–17.0)
MCH: 31.1 pg (ref 26.0–34.0)
MCHC: 33 g/dL (ref 30.0–36.0)
MCV: 94.2 fL (ref 78.0–100.0)
Platelets: 248 10*3/uL (ref 150–400)
RBC: 3.99 MIL/uL — AB (ref 4.22–5.81)
RDW: 13 % (ref 11.5–15.5)
WBC: 14.6 10*3/uL — AB (ref 4.0–10.5)

## 2014-12-03 LAB — GLUCOSE, CAPILLARY
Glucose-Capillary: 131 mg/dL — ABNORMAL HIGH (ref 65–99)
Glucose-Capillary: 94 mg/dL (ref 65–99)
Glucose-Capillary: 126 mg/dL — ABNORMAL HIGH (ref 65–99)
Glucose-Capillary: 114 mg/dL — ABNORMAL HIGH (ref 65–99)

## 2014-12-03 LAB — BASIC METABOLIC PANEL
ANION GAP: 10 (ref 5–15)
BUN: 13 mg/dL (ref 6–20)
CALCIUM: 9.2 mg/dL (ref 8.9–10.3)
CHLORIDE: 96 mmol/L — AB (ref 101–111)
CO2: 30 mmol/L (ref 22–32)
Creatinine, Ser: 0.76 mg/dL (ref 0.61–1.24)
GFR calc Af Amer: >60 mL/min (ref >60)
GFR calc non Af Amer: >60 mL/min (ref >60)
Glucose, Bld: 145 mg/dL — ABNORMAL HIGH (ref 65–99)
POTASSIUM: 4.2 mmol/L (ref 3.5–5.1)
Sodium: 136 mmol/L (ref 135–145)

## 2014-12-03 MED ORDER — GLIMEPIRIDE 2 MG PO TABS
2.0000 mg | ORAL_TABLET | Freq: Every day | ORAL | Status: DC
  Administered 2014-12-03 – 2014-12-15 (×13): 2 mg via ORAL
  Filled 2014-12-03 (×17): qty 1

## 2014-12-03 MED ORDER — LOSARTAN POTASSIUM-HCTZ 100-12.5 MG PO TABS
1.0000 | ORAL_TABLET | Freq: Every day | ORAL | Status: DC
Start: 2014-12-03 — End: 2014-12-03

## 2014-12-03 MED ORDER — HYDROCHLOROTHIAZIDE 12.5 MG PO CAPS
12.5000 mg | ORAL_CAPSULE | Freq: Every day | ORAL | Status: DC
  Administered 2014-12-03 – 2014-12-06 (×4): 12.5 mg via ORAL
  Filled 2014-12-03 (×5): qty 1

## 2014-12-03 MED ORDER — BISACODYL 10 MG RE SUPP
10.0000 mg | Freq: Once | RECTAL | Status: AC
  Administered 2014-12-03: 10 mg via RECTAL
  Filled 2014-12-03: qty 1

## 2014-12-03 MED ORDER — LOSARTAN POTASSIUM 50 MG PO TABS
100.0000 mg | ORAL_TABLET | Freq: Every day | ORAL | Status: DC
  Administered 2014-12-03 – 2014-12-06 (×4): 100 mg via ORAL
  Filled 2014-12-03 (×5): qty 2

## 2014-12-03 MED ORDER — GUAIFENESIN ER 600 MG PO TB12
1200.0000 mg | ORAL_TABLET | Freq: Two times a day (BID) | ORAL | Status: AC
  Administered 2014-12-03 – 2014-12-07 (×10): 1200 mg via ORAL
  Filled 2014-12-03 (×11): qty 2

## 2014-12-03 MED ORDER — POLYETHYLENE GLYCOL 3350 17 G PO PACK
17.0000 g | PACK | Freq: Once | ORAL | Status: AC
  Administered 2014-12-03: 17 g via ORAL
  Filled 2014-12-03: qty 1

## 2014-12-03 NOTE — Plan of Care (Signed)
Problem: Problem: Respiratory Progression Goal: ABLE TO D/C CHEST TUBE Outcome: Progressing 1 CT d/c'd today, still has large airleak, pt participating in pulmonary toliet and activity progression

## 2014-12-03 NOTE — Progress Notes (Signed)
4 Days Post-Op Procedure(s) (LRB): VIDEO BRONCHOSCOPY (N/A) VIDEO ASSISTED THORACOSCOPY (VATS)/ LOBECTOMY with on-Q pain pump placement MINI/LIMITED THORACOTOMY (Right) LOBECTOMY; right upper lobe (Right) LYMPH NODE DISSECTION (Right) Subjective: Feels better after adjustments to pain medications Still c/o abdominal distention, + Flatus, No BM since surgery Frequent productive cough  Objective: Vital signs in last 24 hours: Temp:  [99.1 F (37.3 C)-100.5 F (38.1 C)] 100.5 F (38.1 C) (05/22 0000) Pulse Rate:  [80-126] 107 (05/22 0900) Cardiac Rhythm:  [-] Sinus tachycardia (05/22 0800) Resp:  [15-29] 19 (05/22 0900) BP: (98-166)/(52-125) 148/79 mmHg (05/22 0900) SpO2:  [89 %-98 %] 93 % (05/22 0900) Weight:  [187 lb 6.3 oz (85 kg)] 187 lb 6.3 oz (85 kg) (05/22 0800)  Hemodynamic parameters for last 24 hours:    Intake/Output from previous day: 05/21 0701 - 05/22 0700 In: 1060 [P.O.:820; I.V.:240] Out: 3355 [Urine:3225; Chest Tube:130] Intake/Output this shift: Total I/O In: 20 [I.V.:20] Out: 400 [Urine:400]  General appearance: alert, cooperative and no distress Neurologic: intact Heart: regular rate and rhythm Lungs: diminished breath sounds right base and rhonchi bilaterally Abdomen: distended, tympanitic, nontender small air leak  Lab Results:  Recent Labs  12/02/14 0430 12/03/14 0500  WBC 14.6* 14.6*  HGB 11.9* 12.4*  HCT 35.7* 37.6*  PLT 198 248   BMET:  Recent Labs  12/02/14 0430 12/03/14 0500  NA 134* 136  K 4.0 4.2  CL 98* 96*  CO2 29 30  GLUCOSE 137* 145*  BUN 13 13  CREATININE 0.77 0.76  CALCIUM 8.6* 9.2    PT/INR: No results for input(s): LABPROT, INR in the last 72 hours. ABG    Component Value Date/Time   PHART TEST WILL BE CREDITED 11/30/2014 0500   HCO3 TEST WILL BE CREDITED 11/30/2014 0500   TCO2 TEST WILL BE CREDITED 11/30/2014 0500   ACIDBASEDEF TEST WILL BE CREDITED 11/30/2014 0500   O2SAT TEST WILL BE CREDITED 11/30/2014  0500   CBG (last 3)   Recent Labs  12/02/14 1723 12/02/14 2151 12/03/14 0833  GLUCAP 163* 132* 131*    Assessment/Plan: S/P Procedure(s) (LRB): VIDEO BRONCHOSCOPY (N/A) VIDEO ASSISTED THORACOSCOPY (VATS)/ LOBECTOMY with on-Q pain pump placement MINI/LIMITED THORACOTOMY (Right) LOBECTOMY; right upper lobe (Right) LYMPH NODE DISSECTION (Right) Plan for transfer to step-down: see transfer orders   Looks better today  CV- hypertensive, restart losartan/HCTZ  RESP- drainage down- dc posterior CT  Still has air leak, subcutaneous emphysema unchanged- keep anterior tube to suction for now  Chest xray shows marked improvement in aeration of right lung  RENAL- lytes and creatinine OK  CBG adequately controlled- restart amaryl  GI- ileus, passing flatus, will give miralax for constipation  SCD + enoxaparin for DVT prophylaxis   LOS: 4 days    Melrose Nakayama 12/03/2014

## 2014-12-03 NOTE — Progress Notes (Signed)
      [address]       [place] [address]             [phone]       Was having back pain earlier but better now  BP 109/57 mmHg  Pulse 44  Temp(Src) 100.1 F (37.8 C) (Oral)  Resp 27  Ht '5\' 5"'$  (1.651 m)  Wt 187 lb 6.3 oz (85 kg)  BMI 31.18 kg/m2  SpO2 91%   Intake/Output Summary (Last 24 hours) at 12/03/14 1851 Last data filed at 12/03/14 1800  Gross per 24 hour  Intake   1180 ml  Output   3675 ml  Net  -2495 ml    Still has a small air leak  Remo Lipps C. Roxan Hockey, MD [facility] [phone]

## 2014-12-04 ENCOUNTER — Inpatient Hospital Stay (HOSPITAL_COMMUNITY): Payer: Medicare Other

## 2014-12-04 LAB — GLUCOSE, CAPILLARY
Glucose-Capillary: 120 mg/dL — ABNORMAL HIGH (ref 65–99)
Glucose-Capillary: 115 mg/dL — ABNORMAL HIGH (ref 65–99)
Glucose-Capillary: 124 mg/dL — ABNORMAL HIGH (ref 65–99)
Glucose-Capillary: 144 mg/dL — ABNORMAL HIGH (ref 65–99)

## 2014-12-04 LAB — BASIC METABOLIC PANEL
ANION GAP: 7 (ref 5–15)
BUN: 16 mg/dL (ref 6–20)
CALCIUM: 9.1 mg/dL (ref 8.9–10.3)
CO2: 30 mmol/L (ref 22–32)
CREATININE: 0.84 mg/dL (ref 0.61–1.24)
Chloride: 95 mmol/L — ABNORMAL LOW (ref 101–111)
Glucose, Bld: 115 mg/dL — ABNORMAL HIGH (ref 65–99)
Potassium: 4.2 mmol/L (ref 3.5–5.1)
SODIUM: 132 mmol/L — AB (ref 135–145)

## 2014-12-04 LAB — CBC
HCT: 35.2 % — ABNORMAL LOW (ref 39.0–52.0)
HEMOGLOBIN: 11.7 g/dL — AB (ref 13.0–17.0)
MCH: 31.4 pg (ref 26.0–34.0)
MCHC: 33.2 g/dL (ref 30.0–36.0)
MCV: 94.4 fL (ref 78.0–100.0)
Platelets: 252 10*3/uL (ref 150–400)
RBC: 3.73 MIL/uL — ABNORMAL LOW (ref 4.22–5.81)
RDW: 13.1 % (ref 11.5–15.5)
WBC: 12.3 10*3/uL — ABNORMAL HIGH (ref 4.0–10.5)

## 2014-12-04 MED ORDER — LACTULOSE 10 GM/15ML PO SOLN
20.0000 g | Freq: Every day | ORAL | Status: DC | PRN
  Filled 2014-12-04: qty 30

## 2014-12-04 NOTE — Care Management Note (Signed)
Case Management Note  Patient Details  Name: [name] Day MRN: [id number] Date of Birth: 03/18/1946  Subjective/Objective:                  Lives at home with wife - has multiple other family members at home.  CT with air leak remains.  Remains on PCA pain medication for back pain.  Plan is for discharge home with family when medically ready.   Action/Plan: CM will continue to follow for further needs.   Expected Discharge Date:                  Expected Discharge Plan:  [place]  In-House Referral:     Discharge planning Services     Post Acute Care Choice:    Choice offered to:     DME Arranged:    DME Agency:     HH Arranged:    [place] Agency:     Status of Service:  In process, will continue to follow  Medicare Important Message Given:  Yes Date Medicare IM Given:  12/04/14 Medicare IM give by:  Luz Lex, RNBSN  Date Additional Medicare IM Given:    Additional Medicare Important Message give by:     If discussed at [place] of Stay Meetings, dates discussed:    Additional Comments:  Vergie Living, RN 12/04/2014, 12:03 PM

## 2014-12-04 NOTE — Progress Notes (Addendum)
[address]       [place] [address]             [phone]          5 Days Post-Op Procedure(s) (LRB): VIDEO BRONCHOSCOPY (N/A) VIDEO ASSISTED THORACOSCOPY (VATS)/ LOBECTOMY with on-Q pain pump placement MINI/LIMITED THORACOTOMY (Right) LOBECTOMY; right upper lobe (Right) LYMPH NODE DISSECTION (Right)  Subjective: OOB in chair. Just walked in hall.  Productive cough, clear to yellow sputum. Pain better controlled.   Objective: Vital signs in last 24 hours: Patient Vitals for the past 24 hrs:  BP Temp Temp src Pulse Resp SpO2 Weight  12/04/14 0723 - 98.1 F (36.7 C) Oral - - - -  12/04/14 0700 - - - 95 (!) 28 95 % -  12/04/14 0600 - - - (!) 104 (!) 26 90 % -  12/04/14 0500 - - - 85 19 97 % -  12/04/14 0400 (!) 118/56 mmHg 99.6 F (37.6 C) Axillary (!) 105 (!) 25 96 % -  12/04/14 0345 - - - - (!) 25 96 % -  12/04/14 0300 - - - 84 20 96 % -  12/04/14 0200 - - - 89 (!) 23 95 % -  12/04/14 0100 - - - 87 (!) 21 94 % -  12/04/14 0000 - (!) 100.5 F (38.1 C) - 89 (!) 21 97 % -  12/03/14 2340 124/64 mmHg - - 93 (!) 26 96 % -  12/03/14 2300 - - - (!) 104 19 95 % -  12/03/14 2200 - - - (!) 111 (!) 24 92 % -  12/03/14 2100 - - - (!) 102 19 95 % -  12/03/14 2005 - - - (!) 109 (!) 25 93 % -  12/03/14 2000 113/87 mmHg 100.2 F (37.9 C) Oral (!) 109 (!) 23 92 % -  12/03/14 1900 (!) 107/59 mmHg - - (!) 103 (!) 21 92 % -  12/03/14 1800 (!) 109/57 mmHg - - (!) 44 (!) 27 91 % -  12/03/14 1700 98/69 mmHg - - 79 (!) 32 91 % -  12/03/14 1653 - 100.1 F (37.8 C) Oral - - - -  12/03/14 1630 - - - - - 94 % -  12/03/14 1600 106/61 mmHg - - (!) 111 (!) 24 92 % -  12/03/14 1539 - - - - (!) 22 98 % -  12/03/14 1500 119/84 mmHg - - 96 (!) 23 90 % -  12/03/14 1400 123/66 mmHg - - (!) 101 (!) 22 94 % -  12/03/14 1300 133/65 mmHg - - (!) 101 (!) 22 95 % -  12/03/14 1200 126/67 mmHg 99 F (37.2 C) Oral 96 12 94 % -  12/03/14 1145 - - - - - 95 % -  12/03/14 1100  126/74 mmHg - - (!) 105 20 90 % -  12/03/14 1000 116/65 mmHg - - (!) 101 14 93 % -  12/03/14 0900 (!) 148/79 mmHg - - (!) 107 19 93 % -  12/03/14 0800 - - - 80 18 98 % 187 lb 6.3 oz (85 kg)   Current Weight  12/03/14 187 lb 6.3 oz (85 kg)   CBGs [phone]  Intake/Output from previous day: 05/22 0701 - 05/23 0700 In: 1520 [P.O.:1290; I.V.:230] Out: 2810 [Urine:2500; Chest Tube:310]    PHYSICAL EXAM:  Heart: RRR Lungs: Coarse rhonchi bilaterally Wound: Clean and dry Chest tube: 1/7 continuous air leak, slightly increased  with cough    Lab Results: CBC: Recent Labs  12/03/14 0500 12/04/14 0348  WBC 14.6* 12.3*  HGB 12.4* 11.7*  HCT 37.6* 35.2*  PLT 248 252   BMET:  Recent Labs  12/03/14 0500 12/04/14 0348  NA 136 132*  K 4.2 4.2  CL 96* 95*  CO2 30 30  GLUCOSE 145* 115*  BUN 13 16  CREATININE 0.76 0.84  CALCIUM 9.2 9.1    PT/INR: No results for input(s): LABPROT, INR in the last 72 hours.    Assessment/Plan: S/P Procedure(s) (LRB): VIDEO BRONCHOSCOPY (N/A) VIDEO ASSISTED THORACOSCOPY (VATS)/ LOBECTOMY with on-Q pain pump placement MINI/LIMITED THORACOTOMY (Right) LOBECTOMY; right upper lobe (Right) LYMPH NODE DISSECTION (Right) CT with persistent air leak, CXR stable. Continue CT to suction for now. Pulm- continue IS/FV, wean O2 as able. HTN- BPs stable, back on home meds. DM- CBGs controlled, po meds restarted. GI- mild ileus. Passing flatus, no BM yet. Will repeat laxatives today and watch. Continue ambulation, awaiting tx stepdown.   LOS: 5 days    [name] Day,[name] Day 12/04/2014  Patient seen this am with PA, now on 3s Stable day Still small air leak  Leave chest to suction for now I have seen and examined [name] and agree with the above assessment  and plan.  Grace Isaac MD Beeper [phone] Office [phone] 12/04/2014 5:48 PM

## 2014-12-04 NOTE — Op Note (Signed)
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  SURGICAL PROCEDURE:  Bronchoscopy, right video-assisted thoracoscopy, right mini thoracotomy, right upper lobectomy with lymph node dissection, and placement of On-Q device.  SURGEON:  Lanelle Bal, MD.  FIRST ASSISTANT:  Providence Crosby, PA.  BRIEF HISTORY:  The patient is a 69 year old male with a long history of smoking who presented with abnormal CT scan of the chest after his the nurse from healthcare recommended that he has a CT scan of the chest because of his smoking history.  He was seen by Dr. Halford Chessman and arrangements for a needle biopsy was performed and a PET scan.  PET scan showed a 4 cm cavitary mass in the right upper lobe and a less than 1 cm mass in the left lung which were hypermetabolic.  There were no mediastinal nodes or masses.  They were hypermetabolic on PET scan.  The patient's pulmonary function was slightly compromised with diffusion capacity of 60% and FEV1 of 80%.  He could not tolerate MRI of the brain, but did tolerate a CT scan of the head which showed an old meningioma which had not changed in size from previous scans.  There was no evidence of metastatic disease.  The patient was staged clinically as 1P on the right and 1 on the left if the left is malignant.  Risks and options were discussed with the patient.  He agreed with proceeding surgical resection.  DESCRIPTION OF PROCEDURE:  A central line and arterial line in place, the patient underwent general endotracheal anesthesia without  incident. A double-lumen endotracheal tube was placed.  Appropriate time-out was performed, and using a video bronchoscope, a bronchoscopy was performed. Through this segmental level in the right and left tracheobronchial tree, the double-lumen endotracheal tube was in proper alignment.  The patient was then turned in lateral decubitus position with the right side up.  The right lung was collapsed.  The right chest was prepped.  A second time-out was performed and confirmed the preoperative marking.  A small incision was made in approximately 4th intercostal space in the midaxillary line.  The thoracoscope was introduced into the chest.  This showed areas of adhesions to the chest wall probably related to the biopsy, but the medial portion of the lung was adherent to the mediastinum.  This site was enlarged as a utilitarian incision, and through this incision and the 2 additional port sites, we then proceeded with dissection of the right upper lobe.  This was somewhat tedious because of the adhesions and the patient's minor fissure was basically nonexistent.  We initially circled the superior pulmonary vein and this was divided with a vascular stapler.  The dissection carried along the pulmonary artery and 3 upper lobe pulmonary artery branches  were identified and each was stapled with a vascular stapler.  We continued with the dissection and dissected out the right upper lobe bronchus.  A black stapler was placed across the bronchus and the bronchus clamped. Inflation of the right lung confirmed the middle and lower lobe inflated.  The fiberoptic bronchoscope was also passed by Anesthesia down the endotracheal tube and confirming the anatomy of the closure of the right upper lobe bronchus and the bronchus was stapled.  We then proceeded to staple across the middle lobe to create a minor fissure. The major fissure was somewhat more complete and in similar fashion a fissure was completed  with a stapler.  The specimen was placed in a large specimen bag and brought out through the incision.  We proceeded with mediastinal lymph node dissection in separate cups, two 10R lymph node areas were submitted an 11R, 4R and 2R.  A ProGel was placed along the cut edges of the lung where they had been stapled to complete the fissure.  The bronchial stump was tested and was without air leak.  The On-Q device was tunneled subpleurally through a separate site posteriorly and secured in place.  The inferior pulmonary ligament was divided.  Two chest tubes were left in place.  The ribs were reapproximated and stabilized with 0 Vicryl and drilled through a small hole in the lower rib.  The muscle layers were closed with interrupted 0 Vicryl, running 3-0 Vicryl in subcutaneous tissue, and 3-0 subcuticular stitch in the skin edges.  Dermabond was applied.  The lung had nicely inflated.  The patient's estimated blood loss was approximately 250-300 mL.  The patient was then awakened and extubated in the operating room and transferred to the recovery room for further postoperative care. Sponge and needle count was reported as correct.  Frozen section upon the completion of the cryo confirmed squamous cell carcinoma in the right upper lobe with negative margins.     Lanelle Bal, MD     EG/MEDQ  D:  12/04/2014  T:  12/04/2014  Job:  [id number]

## 2014-12-05 ENCOUNTER — Inpatient Hospital Stay (HOSPITAL_COMMUNITY): Payer: Medicare Other

## 2014-12-05 LAB — GLUCOSE, CAPILLARY
Glucose-Capillary: 104 mg/dL — ABNORMAL HIGH (ref 65–99)
Glucose-Capillary: 107 mg/dL — ABNORMAL HIGH (ref 65–99)
Glucose-Capillary: 147 mg/dL — ABNORMAL HIGH (ref 65–99)
Glucose-Capillary: 145 mg/dL — ABNORMAL HIGH (ref 65–99)

## 2014-12-05 MED ORDER — POTASSIUM CHLORIDE CRYS ER 20 MEQ PO TBCR
20.0000 meq | EXTENDED_RELEASE_TABLET | Freq: Every day | ORAL | Status: DC
  Administered 2014-12-05 – 2014-12-15 (×11): 20 meq via ORAL
  Filled 2014-12-05 (×13): qty 1

## 2014-12-05 MED ORDER — FUROSEMIDE 40 MG PO TABS
40.0000 mg | ORAL_TABLET | Freq: Every day | ORAL | Status: DC
  Administered 2014-12-05 – 2014-12-15 (×11): 40 mg via ORAL
  Filled 2014-12-05 (×11): qty 1

## 2014-12-05 NOTE — Progress Notes (Addendum)
[facility] DAILY ICU PROGRESS NOTE                   [address]            [place] [address]          [phone]   6 Days Post-Op Procedure(s) (LRB): VIDEO BRONCHOSCOPY (N/A) VIDEO ASSISTED THORACOSCOPY (VATS)/ LOBECTOMY with on-Q pain pump placement MINI/LIMITED THORACOTOMY (Right) LOBECTOMY; right upper lobe (Right) LYMPH NODE DISSECTION (Right)  Total Length of Stay:  LOS: 6 days   Subjective: Feels pretty good overall   Objective: Vital signs in last 24 hours: Temp:  [97.9 F (36.6 C)-98.9 F (37.2 C)] 98.3 F (36.8 C) (05/24 0754) Pulse Rate:  [80-109] 109 (05/24 0742) Cardiac Rhythm:  [-] Normal sinus rhythm (05/24 0742) Resp:  [17-31] 20 (05/24 0742) BP: (97-118)/(54-68) 105/56 mmHg (05/24 0742) SpO2:  [91 %-100 %] 91 % (05/24 0815)  Filed Weights   11/30/14 0500 12/01/14 0600 12/03/14 0800  Weight: 186 lb 1.1 oz (84.4 kg) 193 lb 9 oz (87.8 kg) 187 lb 6.3 oz (85 kg)    Weight change:    Hemodynamic parameters for last 24 hours:    Intake/Output from previous day: 05/23 0701 - 05/24 0700 In: 586.3 [P.O.:330; I.V.:256.3] Out: 2780 [Urine:2705; Chest Tube:75]  Intake/Output this shift: Total I/O In: 10 [I.V.:10] Out: 560 [Urine:500; Chest Tube:60]  Current Meds: Scheduled Meds: . antiseptic oral rinse  7 mL Mouth Rinse BID  . aspirin EC  81 mg Oral Daily  . bisacodyl  10 mg Oral Daily  . cholecalciferol  4,000 Units Oral Daily  . enoxaparin (LOVENOX) injection  40 mg Subcutaneous Q24H  . glimepiride  2 mg Oral Q breakfast  . guaiFENesin  1,200 mg Oral BID  . losartan  100 mg Oral Daily   And  . hydrochlorothiazide  12.5 mg Oral Daily  . HYDROmorphone PCA 0.3 mg/mL   Intravenous 6 times per day  . insulin aspart  0-15 Units Subcutaneous TID AC & HS  . levalbuterol  0.63 mg Nebulization Q6H  . mometasone-formoterol  2 puff Inhalation BID  . morphine  60 mg Oral Q12H  . nicotine  21 mg Transdermal Daily  . pravastatin  20 mg Oral  QHS  . senna-docusate  1 tablet Oral QHS   Continuous Infusions: . 0.45 % NaCl with KCl 20 mEq / L 10 mL/hr at 12/04/14 1900  . nitroGLYCERIN     PRN Meds:.albuterol, diphenhydrAMINE **OR** diphenhydrAMINE, lactulose, naloxone **AND** sodium chloride, ondansetron (ZOFRAN) IV, oxyCODONE-acetaminophen **AND** oxyCODONE, potassium chloride  General appearance: alert, cooperative and no distress Heart: regular rate and rhythm Lungs: coarse, improved with cough Abdomen: some distension, non-tender, + BS Extremities: minor edema Wound: incis healing well  Lab Results: CBC: Recent Labs  12/03/14 0500 12/04/14 0348  WBC 14.6* 12.3*  HGB 12.4* 11.7*  HCT 37.6* 35.2*  PLT 248 252   BMET:  Recent Labs  12/03/14 0500 12/04/14 0348  NA 136 132*  K 4.2 4.2  CL 96* 95*  CO2 30 30  GLUCOSE 145* 115*  BUN 13 16  CREATININE 0.76 0.84  CALCIUM 9.2 9.1    PT/INR: No results for input(s): LABPROT, INR in the last 72 hours. Radiology: Dg Chest Port 1 View  12/05/2014   CLINICAL DATA:  Lung cancer.  EXAM: PORTABLE CHEST - 1 VIEW  COMPARISON:  12/04/2014.  FINDINGS: Bilateral IJ lines and right chest tube in stable position. Mediastinum hilar  structures are stable. Right pneumothorax stable. Cardiomegaly with bilateral pulmonary interstitial prominence consistent with mild congestive heart failure. No pleural effusion. Right chest wall subcutaneous emphysema.  IMPRESSION: 1. Lines and tubes in stable position. Right chest tube in stable position. Small right-sided pneumothorax again noted. Right chest wall subcutaneous again noted. 2. Cardiomegaly with interim appearance of bilateral pulmonary interstitial prominence consistent with mild congestive heart failure.   Electronically Signed   By: Marcello Moores  Register   On: 12/05/2014 07:45     Diagnosis: Pathology 1. Lymph node, biopsy, 11 R - THERE IS NO EVIDENCE OF CARCINOMA IN 1 OF 1 LYMPH NODE (0/1). 2. Lymph node, biopsy, 10 R - THERE IS NO  EVIDENCE OF CARCINOMA IN 1 OF 1 LYMPH NODE (0/1). 3. Lymph node, biopsy, 10 R #2 - THERE IS NO EVIDENCE OF CARCINOMA IN 1 OF 1 LYMPH NODE (0/1). 4. Lymph node, biopsy, 4 R - THERE IS NO EVIDENCE OF CARCINOMA IN 1 OF 1 LYMPH NODE (0/1). 5. Lung, resection (segmental or lobe), Right upper lobe - INVASIVE SQUAMOUS CELL CARCINOMA, MODERATELY TO POORLY DIFFERENTIATED, SPANNING 4.8 CM. - THE SURGICAL RESECTION MARGINS ARE NEGATIVE FOR CARCINOMA. - SEE ONCOLOGY TABLE BELOW. 6. Lymph node, biopsy, 2 R node - THERE IS NO EVIDENCE OF CARCINOMA IN 1 OF 1 LYMPH NODE (0/1). Microscopic Comment 5. LUNG Specimen, including laterality: Right upper lobe and mediastinal lymph nodes. Procedure: Lobectomy and multiple lymph node resections. Specimen integrity (intact/disrupted): Intact. Tumor site: Right upper lobe, subpleural. Tumor focality: Unifocal. Maximum tumor size (cm): 4.8 cm. Histologic type: Squamous cell carcinoma. Grade: Moderately to poorly differentiated. Margins: Negative for carcinoma. Distance to closest margin (cm): 3.0 cm to the bronchial and vascular resection margins (gross measurement). Visceral pleura invasion: Not identified. Tumor extension: Confined to lung parenchyma. 1 of 3 FINAL for [name] Day, [name] Day [id number]) Microscopic Comment(continued) Treatment effect (if treated with neoadjuvant therapy): N/A Lymph -Vascular invasion: Not identified. Lymph nodes: Number examined - 5; Number N1 nodes positive 0; Number N2 nodes positive 0 TNM code: pT2a, pN0 Ancillary studies: Can be performed upon clinician request. Non-neoplastic lung: No significant findings. (JBK:ecj 12/01/2014) Enid Cutter MD Pathologist, Electronic Signature  Assessment/Plan: S/P Procedure(s) (LRB): VIDEO BRONCHOSCOPY (N/A) VIDEO ASSISTED THORACOSCOPY (VATS)/ LOBECTOMY with on-Q pain pump placement MINI/LIMITED THORACOTOMY (Right) LOBECTOMY; right upper lobe (Right) LYMPH NODE DISSECTION (Right)  1  chest tube + air leak, small pntx- keep CT to suction 2 CHF on xray, will diurese some, check 12 lead to see if he as had any changes 3 no new labs 4 sugars with pretty good control   GOLD,WAYNE E 12/05/2014 8:43 AM  I have seen and examined [name] Day and agree with the above assessment  and plan.  Grace Isaac MD Beeper [phone] Office [phone] 12/05/2014 3:00 PM

## 2014-12-06 ENCOUNTER — Inpatient Hospital Stay (HOSPITAL_COMMUNITY): Payer: Medicare Other

## 2014-12-06 LAB — GLUCOSE, CAPILLARY
Glucose-Capillary: 124 mg/dL — ABNORMAL HIGH (ref 65–99)
Glucose-Capillary: 159 mg/dL — ABNORMAL HIGH (ref 65–99)
Glucose-Capillary: 109 mg/dL — ABNORMAL HIGH (ref 65–99)
Glucose-Capillary: 115 mg/dL — ABNORMAL HIGH (ref 65–99)

## 2014-12-06 NOTE — Progress Notes (Addendum)
[name] Day       [name] [address]             [phone]      7 Days Post-Op Procedure(s) (LRB): VIDEO BRONCHOSCOPY (N/A) VIDEO ASSISTED THORACOSCOPY (VATS)/ LOBECTOMY with on-Q pain pump placement MINI/LIMITED THORACOTOMY (Right) LOBECTOMY; right upper lobe (Right) LYMPH NODE DISSECTION (Right) Subjective: Back hurts, exacerbated chronic issue. Not SOB, some cough   Objective: Vital signs in last 24 hours: Temp:  [98.4 F (36.9 C)-99.3 F (37.4 C)] 98.4 F (36.9 C) (05/25 0448) Pulse Rate:  [80-95] 85 (05/25 0448) Cardiac Rhythm:  [-] Normal sinus rhythm (05/25 0448) Resp:  [17-24] 22 (05/25 0448) BP: (108-120)/(55-72) 108/59 mmHg (05/25 0448) SpO2:  [91 %-97 %] 93 % (05/25 0448)  Hemodynamic parameters for last 24 hours:    Intake/Output from previous day: 05/24 0701 - 05/25 0700 In: 1410 [P.O.:1400; I.V.:10] Out: 3165 [Urine:3025; Chest Tube:140] Intake/Output this shift:    General appearance: alert, cooperative, fatigued and no distress Heart: regular rate and rhythm Lungs: dim in right lower fields Abdomen: soft, nontender Extremities: no LE edema Wound: incis healing well  Lab Results:  Recent Labs  12/04/14 0348  WBC 12.3*  HGB 11.7*  HCT 35.2*  PLT 252   BMET:  Recent Labs  12/04/14 0348  NA 132*  K 4.2  CL 95*  CO2 30  GLUCOSE 115*  BUN 16  CREATININE 0.84  CALCIUM 9.1    PT/INR: No results for input(s): LABPROT, INR in the last 72 hours. ABG    Component Value Date/Time   PHART TEST WILL BE CREDITED 11/30/2014 0500   HCO3 TEST WILL BE CREDITED 11/30/2014 0500   TCO2 TEST WILL BE CREDITED 11/30/2014 0500   ACIDBASEDEF TEST WILL BE CREDITED 11/30/2014 0500   O2SAT TEST WILL BE CREDITED 11/30/2014 0500   CBG (last 3)   Recent Labs  12/05/14 1136 12/05/14 1638 12/05/14 2131  GLUCAP 107* 147* 145*    Meds Scheduled Meds: . antiseptic oral rinse  7 mL Mouth Rinse BID  . aspirin EC  81 mg Oral  Daily  . bisacodyl  10 mg Oral Daily  . cholecalciferol  4,000 Units Oral Daily  . enoxaparin (LOVENOX) injection  40 mg Subcutaneous Q24H  . furosemide  40 mg Oral Daily  . glimepiride  2 mg Oral Q breakfast  . guaiFENesin  1,200 mg Oral BID  . losartan  100 mg Oral Daily   And  . hydrochlorothiazide  12.5 mg Oral Daily  . insulin aspart  0-15 Units Subcutaneous TID AC & HS  . levalbuterol  0.63 mg Nebulization Q6H  . mometasone-formoterol  2 puff Inhalation BID  . morphine  60 mg Oral Q12H  . nicotine  21 mg Transdermal Daily  . potassium chloride  20 mEq Oral Daily  . pravastatin  20 mg Oral QHS  . senna-docusate  1 tablet Oral QHS   Continuous Infusions: . nitroGLYCERIN     PRN Meds:.albuterol, lactulose, oxyCODONE-acetaminophen **AND** oxyCODONE, potassium chloride  Xrays Dg Chest Port 1 View  12/06/2014   CLINICAL DATA:  Status post VATS on the right  EXAM: PORTABLE CHEST - 1 VIEW  COMPARISON:  12/05/2014  FINDINGS: Cardiac shadow is stable at the upper limits of normal. A right jugular line is again seen in satisfactory position. Right-sided chest tube is again noted with mild residual pneumothorax. The overall appearance is stable from the prior exam. Vascular congestion is  again seen with mild pulmonary edema. No focal confluent infiltrate is seen.  IMPRESSION: Overall no significant interval change from the prior exam.   Electronically Signed   By: [name] Day M.D.   On: 12/06/2014 07:54   Dg Chest Port 1 View  12/05/2014   CLINICAL DATA:  Lung cancer.  EXAM: PORTABLE CHEST - 1 VIEW  COMPARISON:  12/04/2014.  FINDINGS: Bilateral IJ lines and right chest tube in stable position. Mediastinum hilar structures are stable. Right pneumothorax stable. Cardiomegaly with bilateral pulmonary interstitial prominence consistent with mild congestive heart failure. No pleural effusion. Right chest wall subcutaneous emphysema.  IMPRESSION: 1. Lines and tubes in stable position. Right chest  tube in stable position. Small right-sided pneumothorax again noted. Right chest wall subcutaneous again noted. 2. Cardiomegaly with interim appearance of bilateral pulmonary interstitial prominence consistent with mild congestive heart failure.   Electronically Signed   By: [name] Day  Register   On: 12/05/2014 07:45    Assessment/Plan: S/P Procedure(s) (LRB): VIDEO BRONCHOSCOPY (N/A) VIDEO ASSISTED THORACOSCOPY (VATS)/ LOBECTOMY with on-Q pain pump placement MINI/LIMITED THORACOTOMY (Right) LOBECTOMY; right upper lobe (Right) LYMPH NODE DISSECTION (Right)  1 Chest tube - + air leak persists, continue. CXR stable in appearance 2 some CHF on CXR- cont diuresis- making good urine. 12 lead EKG without acute findings. Hemodyn stable in sinus rhythm 3 CBG's controlled 4 repeat labs in am   LOS: 7 days    [name] Day 12/06/2014  Still with air leak ,leave ct for now I have seen and examined [name] Day and agree with the above assessment  and plan.  [name] Isaac MD Beeper [phone] Office [phone] 12/06/2014 4:11 PM

## 2014-12-06 NOTE — Progress Notes (Signed)
Medicare Important Message given? YES (If response is "NO", the following Medicare IM given date fields will be blank) Date Medicare IM given:12/06/14 Medicare IM given by: Whitman Hero

## 2014-12-07 ENCOUNTER — Inpatient Hospital Stay (HOSPITAL_COMMUNITY): Payer: Medicare Other

## 2014-12-07 LAB — GLUCOSE, CAPILLARY
Glucose-Capillary: 117 mg/dL — ABNORMAL HIGH (ref 65–99)
Glucose-Capillary: 138 mg/dL — ABNORMAL HIGH (ref 65–99)
Glucose-Capillary: 136 mg/dL — ABNORMAL HIGH (ref 65–99)
Glucose-Capillary: 108 mg/dL — ABNORMAL HIGH (ref 65–99)

## 2014-12-07 LAB — BASIC METABOLIC PANEL
Anion gap: 11 (ref 5–15)
BUN: 19 mg/dL (ref 6–20)
CO2: 30 mmol/L (ref 22–32)
Calcium: 9.5 mg/dL (ref 8.9–10.3)
Chloride: 93 mmol/L — ABNORMAL LOW (ref 101–111)
Creatinine, Ser: 0.92 mg/dL (ref 0.61–1.24)
GFR calc Af Amer: >60 mL/min (ref >60)
GFR calc non Af Amer: >60 mL/min (ref >60)
Glucose, Bld: 123 mg/dL — ABNORMAL HIGH (ref 65–99)
POTASSIUM: 4.1 mmol/L (ref 3.5–5.1)
SODIUM: 134 mmol/L — AB (ref 135–145)

## 2014-12-07 LAB — CBC
HCT: 35.6 % — ABNORMAL LOW (ref 39.0–52.0)
Hemoglobin: 12.1 g/dL — ABNORMAL LOW (ref 13.0–17.0)
MCH: 31.8 pg (ref 26.0–34.0)
MCHC: 34 g/dL (ref 30.0–36.0)
MCV: 93.7 fL (ref 78.0–100.0)
Platelets: 394 10*3/uL (ref 150–400)
RBC: 3.8 MIL/uL — AB (ref 4.22–5.81)
RDW: 12.9 % (ref 11.5–15.5)
WBC: 12.5 10*3/uL — ABNORMAL HIGH (ref 4.0–10.5)

## 2014-12-07 MED ORDER — FENTANYL 12 MCG/HR TD PT72
12.5000 ug | MEDICATED_PATCH | TRANSDERMAL | Status: DC
  Administered 2014-12-07: 12.5 ug via TRANSDERMAL
  Filled 2014-12-07: qty 1

## 2014-12-07 MED ORDER — LOSARTAN POTASSIUM 50 MG PO TABS
50.0000 mg | ORAL_TABLET | Freq: Every day | ORAL | Status: DC
  Administered 2014-12-07 – 2014-12-15 (×9): 50 mg via ORAL
  Filled 2014-12-07 (×9): qty 1

## 2014-12-07 MED ORDER — HYDROCHLOROTHIAZIDE 12.5 MG PO CAPS
12.5000 mg | ORAL_CAPSULE | Freq: Every day | ORAL | Status: DC
  Administered 2014-12-07 – 2014-12-15 (×9): 12.5 mg via ORAL
  Filled 2014-12-07 (×9): qty 1

## 2014-12-07 NOTE — Progress Notes (Signed)
   12/07/14 1107  Spiritual Encounters  Spiritual Needs Emotional  Referred by RN.  Patient had emotional outburst and other behavioral issues including an episode with night nurse and family.  Chaplain asked to check-in with patient and observe.  Patient seemed irritated and was very resistant to talk with chaplain, looking away and being evasive.  Attempted to discuss with patient some of the areas referred by RN.  Patient was evasive in response, but was respectful.  Patient seemed to have lots of general anger.  Chaplain discussed visit with RN.

## 2014-12-07 NOTE — Progress Notes (Signed)
UR COMPLETED  

## 2014-12-07 NOTE — Progress Notes (Addendum)
      [address]       [place] [address]             [phone]      8 Days Post-Op Procedure(s) (LRB): VIDEO BRONCHOSCOPY (N/A) VIDEO ASSISTED THORACOSCOPY (VATS)/ LOBECTOMY with on-Q pain pump placement MINI/LIMITED THORACOTOMY (Right) LOBECTOMY; right upper lobe (Right) LYMPH NODE DISSECTION (Right)   Subjective:  [name] Day complains of pain.  He states it is inhibiting his ability to ambulate.  + BM  Objective: Vital signs in last 24 hours: Temp:  [98.3 F (36.8 C)-98.7 F (37.1 C)] 98.3 F (36.8 C) (05/26 0350) Pulse Rate:  [63-102] 90 (05/26 0350) Cardiac Rhythm:  [-] Normal sinus rhythm (05/26 0350) Resp:  [15-27] 23 (05/26 0350) BP: (101-117)/(60-75) 101/60 mmHg (05/26 0350) SpO2:  [90 %-100 %] 96 % (05/26 0758)  Intake/Output from previous day: 05/25 0701 - 05/26 0700 In: 1440 [P.O.:1440] Out: 1950 [Urine:1800; Chest Tube:150]  General appearance: alert, cooperative and no distress Heart: regular rate and rhythm Lungs: diminished breath sounds on right Abdomen: soft, non-tender; bowel sounds normal; no masses,  no organomegaly Wound: clean and dry  Lab Results:  Recent Labs  12/07/14 0100  WBC 12.5*  HGB 12.1*  HCT 35.6*  PLT 394   BMET:  Recent Labs  12/07/14 0100  NA 134*  K 4.1  CL 93*  CO2 30  GLUCOSE 123*  BUN 19  CREATININE 0.92  CALCIUM 9.5    PT/INR: No results for input(s): LABPROT, INR in the last 72 hours. ABG    Component Value Date/Time   PHART TEST WILL BE CREDITED 11/30/2014 0500   HCO3 TEST WILL BE CREDITED 11/30/2014 0500   TCO2 TEST WILL BE CREDITED 11/30/2014 0500   ACIDBASEDEF TEST WILL BE CREDITED 11/30/2014 0500   O2SAT TEST WILL BE CREDITED 11/30/2014 0500   CBG (last 3)   Recent Labs  12/06/14 1113 12/06/14 1658 12/06/14 2131  GLUCAP 159* 109* 115*    Assessment/Plan: S/P Procedure(s) (LRB): VIDEO BRONCHOSCOPY (N/A) VIDEO ASSISTED THORACOSCOPY (VATS)/ LOBECTOMY with on-Q pain  pump placement MINI/LIMITED THORACOTOMY (Right) LOBECTOMY; right upper lobe (Right) LYMPH NODE DISSECTION (Right)  1. Chest tube-  Smaller  air leak, try ct on water seal  2. CXR- remains stable in appearance, continued atelectasis, continue IS, wean oxygen as tolerated 3. CV- Hypotension- will decrease Cozaar to 50 mg daily, continue HCTZ at 12.5 mg daily 4. Pain control- chronic back pain, continue home medications, add Fentanyl patch at 12.5 mg daily 5. DM- CBGs controlled, continue home DM medications 6. Dispo- pain control is biggest issues, try fentanyl patch, continue home narcotics, chest tube to remain on suction   LOS: 8 days    BARRETT, ERIN 12/07/2014  I have seen and examined [name] Day and agree with the above assessment  and plan.  Grace Isaac MD Beeper [phone] Office [phone] 12/07/2014 8:56 AM

## 2014-12-08 ENCOUNTER — Inpatient Hospital Stay (HOSPITAL_COMMUNITY): Payer: Medicare Other

## 2014-12-08 LAB — BASIC METABOLIC PANEL
Anion gap: 12 (ref 5–15)
BUN: 17 mg/dL (ref 6–20)
CO2: 28 mmol/L (ref 22–32)
Calcium: 9.3 mg/dL (ref 8.9–10.3)
Chloride: 91 mmol/L — ABNORMAL LOW (ref 101–111)
Creatinine, Ser: 0.86 mg/dL (ref 0.61–1.24)
GFR calc Af Amer: >60 mL/min (ref >60)
GFR calc non Af Amer: >60 mL/min (ref >60)
Glucose, Bld: 158 mg/dL — ABNORMAL HIGH (ref 65–99)
Potassium: 4 mmol/L (ref 3.5–5.1)
Sodium: 131 mmol/L — ABNORMAL LOW (ref 135–145)

## 2014-12-08 LAB — GLUCOSE, CAPILLARY
Glucose-Capillary: 142 mg/dL — ABNORMAL HIGH (ref 65–99)
Glucose-Capillary: 110 mg/dL — ABNORMAL HIGH (ref 65–99)
Glucose-Capillary: 108 mg/dL — ABNORMAL HIGH (ref 65–99)
Glucose-Capillary: 100 mg/dL — ABNORMAL HIGH (ref 65–99)

## 2014-12-08 LAB — CBC
HCT: 39.1 % (ref 39.0–52.0)
Hemoglobin: 13.2 g/dL (ref 13.0–17.0)
MCH: 31.8 pg (ref 26.0–34.0)
MCHC: 33.8 g/dL (ref 30.0–36.0)
MCV: 94.2 fL (ref 78.0–100.0)
Platelets: 454 10*3/uL — ABNORMAL HIGH (ref 150–400)
RBC: 4.15 MIL/uL — ABNORMAL LOW (ref 4.22–5.81)
RDW: 12.9 % (ref 11.5–15.5)
WBC: 11.7 10*3/uL — ABNORMAL HIGH (ref 4.0–10.5)

## 2014-12-08 MED ORDER — FENTANYL 25 MCG/HR TD PT72
25.0000 ug | MEDICATED_PATCH | TRANSDERMAL | Status: DC
  Administered 2014-12-08 – 2014-12-11 (×2): 25 ug via TRANSDERMAL
  Filled 2014-12-08 (×2): qty 1

## 2014-12-08 MED ORDER — LEVALBUTEROL HCL 0.63 MG/3ML IN NEBU: 0.6300 mg | INHALATION_SOLUTION | Freq: Four times a day (QID) | RESPIRATORY_TRACT | Status: DC | PRN

## 2014-12-08 MED ORDER — LEVALBUTEROL HCL 0.63 MG/3ML IN NEBU
0.6300 mg | INHALATION_SOLUTION | Freq: Three times a day (TID) | RESPIRATORY_TRACT | Status: DC
  Administered 2014-12-08 – 2014-12-09 (×5): 0.63 mg via RESPIRATORY_TRACT
  Filled 2014-12-08 (×14): qty 3

## 2014-12-08 MED ORDER — FENTANYL 25 MCG/HR TD PT72: 25.0000 ug | MEDICATED_PATCH | TRANSDERMAL | Status: DC

## 2014-12-08 NOTE — Care Management Note (Signed)
Case Management Note  Patient Details  Name: [name] Day MRN: [id number] Date of Birth: Mar 31, 1946                Expected Discharge Plan:  [facility]  In-House Referral:     Discharge planning Services  CM Consult  Post Acute Care Choice:    Choice offered to:     DME Arranged:    DME Agency:     HH Arranged:    [place]:     Status of Service:  Completed, signed off  Medicare Important Message Given:  Yes Date Medicare IM Given:  12/04/14 Medicare IM give by:  Luz Lex, RNBSN  Date Additional Medicare IM Given:  12/08/14 Additional Medicare Important Message give by:  Jonnie Finner RN CCM   If discussed at Long Length of Stay Meetings, dates discussed:    Additional Comments:  Erenest Rasher, RN 12/08/2014, 4:25 PM

## 2014-12-08 NOTE — Progress Notes (Addendum)
      [address]       [place] [address]             [phone]      9 Days Post-Op Procedure(s) (LRB): VIDEO BRONCHOSCOPY (N/A) VIDEO ASSISTED THORACOSCOPY (VATS)/ LOBECTOMY with on-Q pain pump placement MINI/LIMITED THORACOTOMY (Right) LOBECTOMY; right upper lobe (Right) LYMPH NODE DISSECTION (Right)   Subjective:  [name] Day is feeling a little better this morning.  States he is still experiencing pain but it is better than yesterday.  He is up and moving around room this morning.  + BM  Objective: Vital signs in last 24 hours: Temp:  [97.3 F (36.3 C)-98.6 F (37 C)] 97.3 F (36.3 C) (05/27 0426) Pulse Rate:  [85-97] 85 (05/27 0426) Cardiac Rhythm:  [-] Normal sinus rhythm (05/26 2000) Resp:  [24-26] 24 (05/27 0426) BP: (101-127)/(41-56) 109/50 mmHg (05/27 0426) SpO2:  [93 %-99 %] 98 % (05/27 0426)  Intake/Output from previous day: 05/26 0701 - 05/27 0700 In: 1080 [P.O.:1080] Out: 2706 [Urine:3300; Chest Tube:60]  General appearance: alert, cooperative and no distress Heart: regular rate and rhythm Lungs: diminished breath sounds bibasilar Abdomen: soft, non-tender; bowel sounds normal; no masses,  no organomegaly Wound: clean and dry  Lab Results:  Recent Labs  12/07/14 0100  WBC 12.5*  HGB 12.1*  HCT 35.6*  PLT 394   BMET:  Recent Labs  12/07/14 0100  NA 134*  K 4.1  CL 93*  CO2 30  GLUCOSE 123*  BUN 19  CREATININE 0.92  CALCIUM 9.5    PT/INR: No results for input(s): LABPROT, INR in the last 72 hours.  CBG (last 3)   Recent Labs  12/07/14 1209 12/07/14 1622 12/07/14 2152  GLUCAP 138* 136* 108*    Assessment/Plan: S/P Procedure(s) (LRB): VIDEO BRONCHOSCOPY (N/A) VIDEO ASSISTED THORACOSCOPY (VATS)/ LOBECTOMY with on-Q pain pump placement MINI/LIMITED THORACOTOMY (Right) LOBECTOMY; right upper lobe (Right) LYMPH NODE DISSECTION (Right)  1. Chest tube- +5 air leak with cough, worse than yesterday- chest tube on  water seal,- CXR is stable in appearance 2. Pulm- wean oxygen as tolerated, continue nebs IS 3. Pain control- better, will increase Fentanyl patch continue home oral medications 4. CV- blood pressure improved, continue Cozaar at reduced dose 5. Dispo- patient with worsening air leak this morning, CXR stable, repeat CXR ordered  LOS: 9 days    BARRETT, ERIN 12/08/2014  Some increase in size of apical space now off suction , will put ct back to suction Follow up chest xray in am D/c central line I have seen and examined [name] Day and agree with the above assessment  and plan.  Grace Isaac MD Beeper [phone] Office [phone] 12/08/2014 10:48 AM

## 2014-12-09 ENCOUNTER — Inpatient Hospital Stay (HOSPITAL_COMMUNITY): Payer: Medicare Other

## 2014-12-09 LAB — GLUCOSE, CAPILLARY
Glucose-Capillary: 125 mg/dL — ABNORMAL HIGH (ref 65–99)
Glucose-Capillary: 161 mg/dL — ABNORMAL HIGH (ref 65–99)
Glucose-Capillary: 122 mg/dL — ABNORMAL HIGH (ref 65–99)
Glucose-Capillary: 118 mg/dL — ABNORMAL HIGH (ref 65–99)

## 2014-12-09 NOTE — Progress Notes (Addendum)
      [address]       [place] [address]             [phone]      10 Days Post-Op Procedure(s) (LRB): VIDEO BRONCHOSCOPY (N/A) VIDEO ASSISTED THORACOSCOPY (VATS)/ LOBECTOMY with on-Q pain pump placement MINI/LIMITED THORACOTOMY (Right) LOBECTOMY; right upper lobe (Right) LYMPH NODE DISSECTION (Right)   Subjective:  Mr. [name] Day states his pain is better.  He does have some muscle spasms on his left side.  He also states he can feel air sucking around his chest tube  Objective: Vital signs in last 24 hours: Temp:  [97.7 F (36.5 C)-98.7 F (37.1 C)] 97.7 F (36.5 C) (05/28 0748) Pulse Rate:  [77-106] 77 (05/28 0820) Cardiac Rhythm:  [-] Normal sinus rhythm (05/28 0800) Resp:  [14-24] 15 (05/28 0820) BP: (94-119)/(50-68) 94/55 mmHg (05/28 0730) SpO2:  [94 %-99 %] 96 % (05/28 0820)  Intake/Output from previous day: 05/27 0701 - 05/28 0700 In: 2400 [P.O.:2400] Out: 2075 [Urine:2075] Intake/Output this shift: Total I/O In: 240 [P.O.:240] Out: 0   General appearance: alert, cooperative and no distress Heart: regular rate and rhythm Lungs: clear to auscultation bilaterally Abdomen: soft, non-tender; bowel sounds normal; no masses,  no organomegaly Wound: clean and dry  Lab Results:  Recent Labs  12/07/14 0100 12/08/14 0915  WBC 12.5* 11.7*  HGB 12.1* 13.2  HCT 35.6* 39.1  PLT 394 454*   BMET:  Recent Labs  12/07/14 0100 12/08/14 0915  NA 134* 131*  K 4.1 4.0  CL 93* 91*  CO2 30 28  GLUCOSE 123* 158*  BUN 19 17  CREATININE 0.92 0.86  CALCIUM 9.5 9.3    PT/INR: No results for input(s): LABPROT, INR in the last 72 hours. ABG    Component Value Date/Time   PHART TEST WILL BE CREDITED 11/30/2014 0500   HCO3 TEST WILL BE CREDITED 11/30/2014 0500   TCO2 TEST WILL BE CREDITED 11/30/2014 0500   ACIDBASEDEF TEST WILL BE CREDITED 11/30/2014 0500   O2SAT TEST WILL BE CREDITED 11/30/2014 0500   CBG (last 3)   Recent Labs   12/08/14 1644 12/08/14 2128 12/09/14 0729  GLUCAP 108* 100* 125*    Assessment/Plan: S/P Procedure(s) (LRB): VIDEO BRONCHOSCOPY (N/A) VIDEO ASSISTED THORACOSCOPY (VATS)/ LOBECTOMY with on-Q pain pump placement MINI/LIMITED THORACOTOMY (Right) LOBECTOMY; right upper lobe (Right) LYMPH NODE DISSECTION (Right)  1. Chest tube- continued air leak, will leave chest tube on suction, no CXR ordered for today so I have placed the order 2. Pulm- wean oxygen as tolerated, continue IS 3. Pain control- much better with Fentanyl patch will continue with home pain medications 4. CV- BP remains stable, continue Cozaar 5. Dispo- patient stable, pain under better control, CXR for ordered for this morning, however continues to have air leak will leave on suction  LOS: 10 days    BARRETT, ERIN 12/09/2014  Patient examined, chest x-ray images reviewed Patient has significant air leak with cough Lung drop while off suction for x-ray yesterday Continue suction today, check portable x-ray in a.m.  Tharon Aquas trigt M.D.

## 2014-12-10 ENCOUNTER — Inpatient Hospital Stay (HOSPITAL_COMMUNITY): Payer: Medicare Other

## 2014-12-10 LAB — GLUCOSE, CAPILLARY
Glucose-Capillary: 104 mg/dL — ABNORMAL HIGH (ref 65–99)
Glucose-Capillary: 129 mg/dL — ABNORMAL HIGH (ref 65–99)
Glucose-Capillary: 158 mg/dL — ABNORMAL HIGH (ref 65–99)
Glucose-Capillary: 130 mg/dL — ABNORMAL HIGH (ref 65–99)

## 2014-12-10 MED ORDER — LEVALBUTEROL HCL 0.63 MG/3ML IN NEBU: 0.6300 mg | INHALATION_SOLUTION | Freq: Four times a day (QID) | RESPIRATORY_TRACT | Status: DC | PRN

## 2014-12-10 NOTE — Progress Notes (Addendum)
      [address]       [place] [address]             [phone]      11 Days Post-Op Procedure(s) (LRB): VIDEO BRONCHOSCOPY (N/A) VIDEO ASSISTED THORACOSCOPY (VATS)/ LOBECTOMY with on-Q pain pump placement MINI/LIMITED THORACOTOMY (Right) LOBECTOMY; right upper lobe (Right) LYMPH NODE DISSECTION (Right)   Subjective:  [name] Day has no new complaints.  He states he feels a little better, but continues to have intermittent muscle spasms.  Objective: Vital signs in last 24 hours: Temp:  [97.9 F (36.6 C)-98.5 F (36.9 C)] 98.5 F (36.9 C) (05/29 0800) Pulse Rate:  [83-105] 83 (05/29 0326) Cardiac Rhythm:  [-] Normal sinus rhythm (05/29 0800) Resp:  [15-21] 20 (05/29 0800) BP: (100-115)/(56-68) 114/58 mmHg (05/29 0800) SpO2:  [91 %-95 %] 92 % (05/29 0812)  Intake/Output from previous day: 05/28 0701 - 05/29 0700 In: 600 [P.O.:600] Out: 2165 [Urine:2075; Chest Tube:90]  General appearance: alert, cooperative and no distress Heart: regular rate and rhythm Lungs: clear to auscultation bilaterally Abdomen: soft, non-tender; bowel sounds normal; no masses,  no organomegaly Wound: clean and dry  Lab Results:  Recent Labs  12/08/14 0915  WBC 11.7*  HGB 13.2  HCT 39.1  PLT 454*   BMET:  Recent Labs  12/08/14 0915  NA 131*  K 4.0  CL 91*  CO2 28  GLUCOSE 158*  BUN 17  CREATININE 0.86  CALCIUM 9.3    PT/INR: No results for input(s): LABPROT, INR in the last 72 hours. ABG    Component Value Date/Time   PHART TEST WILL BE CREDITED 11/30/2014 0500   HCO3 TEST WILL BE CREDITED 11/30/2014 0500   TCO2 TEST WILL BE CREDITED 11/30/2014 0500   ACIDBASEDEF TEST WILL BE CREDITED 11/30/2014 0500   O2SAT TEST WILL BE CREDITED 11/30/2014 0500   CBG (last 3)   Recent Labs  12/09/14 1727 12/09/14 2138 12/10/14 0800  GLUCAP 122* 118* 104*    Assessment/Plan: S/P Procedure(s) (LRB): VIDEO BRONCHOSCOPY (N/A) VIDEO ASSISTED THORACOSCOPY  (VATS)/ LOBECTOMY with on-Q pain pump placement MINI/LIMITED THORACOTOMY (Right) LOBECTOMY; right upper lobe (Right) LYMPH NODE DISSECTION (Right)  1.Chest tube- continued air leak, CXR remains stable- leave chest tube on suction 2. Pulm- off oxygen, encouraged good use of IS/Flutter valve 3. Pain control- continue Fentanyl patch, oral home medicaitons 4. CV- remains stable 5. Dispo- patient with continued air leak, leave chest tube to suction   LOS: 11 days    BARRETT, ERIN 12/10/2014  patient examined and medical record reviewed,agree with above note.He needs to continue suction- reduce level of suction tomorrow if there is no pneumothorax on am CXR Tharon Aquas Trigt III 12/10/2014

## 2014-12-11 ENCOUNTER — Inpatient Hospital Stay (HOSPITAL_COMMUNITY): Payer: Medicare Other

## 2014-12-11 LAB — GLUCOSE, CAPILLARY
Glucose-Capillary: 128 mg/dL — ABNORMAL HIGH (ref 65–99)
Glucose-Capillary: 120 mg/dL — ABNORMAL HIGH (ref 65–99)
Glucose-Capillary: 131 mg/dL — ABNORMAL HIGH (ref 65–99)
Glucose-Capillary: 127 mg/dL — ABNORMAL HIGH (ref 65–99)

## 2014-12-11 NOTE — Progress Notes (Signed)
UR COMPLETED  

## 2014-12-11 NOTE — Progress Notes (Addendum)
[facility] DAILY ICU PROGRESS NOTE                   [place]            [place] [address]          [phone]   12 Days Post-Op Procedure(s) (LRB): VIDEO BRONCHOSCOPY (N/A) VIDEO ASSISTED THORACOSCOPY (VATS)/ LOBECTOMY with on-Q pain pump placement MINI/LIMITED THORACOTOMY (Right) LOBECTOMY; right upper lobe (Right) LYMPH NODE DISSECTION (Right)  Total Length of Stay:  LOS: 12 days   Subjective: Feels pretty well, some productive cough   Objective: Vital signs in last 24 hours: Temp:  [97.5 F (36.4 C)-99.3 F (37.4 C)] 99.3 F (37.4 C) (05/30 0700) Pulse Rate:  [82-88] 83 (05/30 0745) Cardiac Rhythm:  [-] Normal sinus rhythm (05/30 0800) Resp:  [13-21] 13 (05/30 0745) BP: (100-125)/(58-95) 114/69 mmHg (05/30 0745) SpO2:  [91 %-96 %] 95 % (05/30 0918)  Filed Weights   11/30/14 0500 12/01/14 0600 12/03/14 0800  Weight: 186 lb 1.1 oz (84.4 kg) 193 lb 9 oz (87.8 kg) 187 lb 6.3 oz (85 kg)    Weight change:    Hemodynamic parameters for last 24 hours:    Intake/Output from previous day: 05/29 0701 - 05/30 0700 In: 240 [P.O.:240] Out: 2525 [Urine:2525]  Intake/Output this shift: Total I/O In: 120 [P.O.:120] Out: 90 [Chest Tube:90]  Current Meds: Scheduled Meds: . antiseptic oral rinse  7 mL Mouth Rinse BID  . aspirin EC  81 mg Oral Daily  . bisacodyl  10 mg Oral Daily  . cholecalciferol  4,000 Units Oral Daily  . enoxaparin (LOVENOX) injection  40 mg Subcutaneous Q24H  . fentaNYL  25 mcg Transdermal Q72H  . furosemide  40 mg Oral Daily  . glimepiride  2 mg Oral Q breakfast  . losartan  50 mg Oral Daily   And  . hydrochlorothiazide  12.5 mg Oral Daily  . insulin aspart  0-15 Units Subcutaneous TID AC & HS  . mometasone-formoterol  2 puff Inhalation BID  . morphine  60 mg Oral Q12H  . nicotine  21 mg Transdermal Daily  . potassium chloride  20 mEq Oral Daily  . pravastatin  20 mg Oral QHS  . senna-docusate  1 tablet Oral QHS    Continuous Infusions: . nitroGLYCERIN     PRN Meds:.albuterol, lactulose, levalbuterol, oxyCODONE-acetaminophen **AND** oxyCODONE, potassium chloride  General appearance: alert, cooperative and no distress Heart: regular rate and rhythm Lungs: some scattered ronchi, improves with cough Abdomen: soft, nontender Extremities: no eema Wound: incis healing well  Lab Results: CBC:No results for input(s): WBC, HGB, HCT, PLT in the last 72 hours. BMET: No results for input(s): NA, K, CL, CO2, GLUCOSE, BUN, CREATININE, CALCIUM in the last 72 hours.  PT/INR: No results for input(s): LABPROT, INR in the last 72 hours. Radiology: Dg Chest Port 1 View  12/11/2014   CLINICAL DATA:  Follow-up right pneumothorax  EXAM: PORTABLE CHEST - 1 VIEW  COMPARISON:  12/10/2014  FINDINGS: Cardiomediastinal silhouette is stable. Right chest tube is unchanged in position. Stable small right upper pneumothorax. Left lung is clear. Stable mild atelectasis right upper lobe.  IMPRESSION: Stable right chest tube position. Again noted small right upper pneumothorax. Left lung is clear.   Electronically Signed   By: Lahoma Crocker M.D.   On: 12/11/2014 08:26     Assessment/Plan: S/P Procedure(s) (LRB): VIDEO BRONCHOSCOPY (N/A) VIDEO ASSISTED THORACOSCOPY (VATS)/ LOBECTOMY with on-Q pain pump placement MINI/LIMITED  THORACOTOMY (Right) LOBECTOMY; right upper lobe (Right) LYMPH NODE DISSECTION (Right)  1 chest tube on H2O seal with + air leak, CXR pretty stable- cont same 2 push pulm toilet/rehab 3 sugars controlled 4 pain pretty well controlled   GOLD,WAYNE E 12/11/2014 12:40 PM patient examined and medical record reviewed,agree with above note. Tharon Aquas Trigt III 12/11/2014

## 2014-12-12 ENCOUNTER — Inpatient Hospital Stay (HOSPITAL_COMMUNITY): Payer: Medicare Other

## 2014-12-12 LAB — GLUCOSE, CAPILLARY
Glucose-Capillary: 125 mg/dL — ABNORMAL HIGH (ref 65–99)
Glucose-Capillary: 86 mg/dL (ref 65–99)
Glucose-Capillary: 97 mg/dL (ref 65–99)
Glucose-Capillary: 123 mg/dL — ABNORMAL HIGH (ref 65–99)

## 2014-12-12 MED ORDER — OXYCODONE HCL 5 MG PO TABS
15.0000 mg | ORAL_TABLET | Freq: Four times a day (QID) | ORAL | Status: DC | PRN
  Administered 2014-12-12 – 2014-12-15 (×11): 15 mg via ORAL
  Filled 2014-12-12 (×11): qty 3

## 2014-12-12 MED ORDER — OXYCODONE-ACETAMINOPHEN 5-325 MG PO TABS
1.0000 | ORAL_TABLET | ORAL | Status: DC | PRN
  Administered 2014-12-12 – 2014-12-15 (×12): 1 via ORAL
  Filled 2014-12-12 (×12): qty 1

## 2014-12-12 MED ORDER — FENTANYL 25 MCG/HR TD PT72
50.0000 ug | MEDICATED_PATCH | TRANSDERMAL | Status: DC
  Administered 2014-12-12 – 2014-12-15 (×2): 50 ug via TRANSDERMAL
  Filled 2014-12-12 (×3): qty 2

## 2014-12-12 NOTE — Progress Notes (Addendum)
      [address]       [place] [address]             [phone]       13 Days Post-Op Procedure(s) (LRB): VIDEO BRONCHOSCOPY (N/A) VIDEO ASSISTED THORACOSCOPY (VATS)/ LOBECTOMY with on-Q pain pump placement MINI/LIMITED THORACOTOMY (Right) LOBECTOMY; right upper lobe (Right) LYMPH NODE DISSECTION (Right)  Subjective: Patient states pain is at times aggravating and he is requesting pain pills every 4 hours.  Objective: Vital signs in last 24 hours: Temp:  [98.2 F (36.8 C)-99 F (37.2 C)] 98.2 F (36.8 C) (05/31 0402) Pulse Rate:  [85-108] 88 (05/31 0748) Cardiac Rhythm:  [-] Normal sinus rhythm (05/31 0400) Resp:  [16-28] 19 (05/31 0748) BP: (94-123)/(55-76) 123/70 mmHg (05/31 0400) SpO2:  [87 %-98 %] 98 % (05/31 0748)     Intake/Output from previous day: 05/30 0701 - 05/31 0700 In: 1320 [P.O.:1320] Out: 1790 [Urine:1650; Chest Tube:140]   Physical Exam:  Cardiovascular: RRR Pulmonary: Clear to auscultation on left and slightly diminished at right apex; no rales, wheezes, or rhonchi. Abdomen: Soft, non tender, bowel sounds present. Wounds: Clean and dry.  No erythema or signs of infection. Chest Tube: to suction, intermittent +1 air leak   Lab Results: CBC:No results for input(s): WBC, HGB, HCT, PLT in the last 72 hours. BMET: No results for input(s): NA, K, CL, CO2, GLUCOSE, BUN, CREATININE, CALCIUM in the last 72 hours.  PT/INR: No results for input(s): LABPROT, INR in the last 72 hours. ABG:  INR: Will add last result for INR, ABG once components are confirmed Will add last 4 CBG results once components are confirmed  Assessment/Plan:  1. CV - SR in the 80's. On Cozaar 50 mg daily and HCTZ 12.5 mg daily. 2.  Pulmonary - Chest tube with 140 cc of output last 24 hours. Chest tube is to suction and there is an intermittent +1 air leak. CXR this am shows stable right pneumothorax. Chest tube to remain for now. Encourage incentive  spirometer. 3. DM-CBGs 120/131/127. On Amaryl 2 mg daily. 4. Regarding pain control, he has chronic pain and was on Percocet and Opana prior to surgery. He is on Duragesic patch and MS Contin here. In addition, he is receiving Oxy IR and Percocet every 6 hours PRN pain. Will change Percocet to every 4 hours PRN for better pain control.  [name],DONIELLE MPA-C 12/12/2014,8:11 AM  Patient seen and examined, agree with above He still has a small, but definite air leak- will try on water seal today Will increase his duragesic patch to 50 mcg  Remo Lipps C. Roxan Hockey, MD [facility] [phone]

## 2014-12-12 NOTE — Progress Notes (Signed)
Medicare Important Message given? YES (If response is "NO", the following Medicare IM given date fields will be blank) Date Medicare IM given:12/12/14 Medicare IM given by: Whitman Hero

## 2014-12-13 ENCOUNTER — Inpatient Hospital Stay (HOSPITAL_COMMUNITY): Payer: Medicare Other

## 2014-12-13 LAB — BASIC METABOLIC PANEL
ANION GAP: 11 (ref 5–15)
BUN: 21 mg/dL — ABNORMAL HIGH (ref 6–20)
CALCIUM: 9 mg/dL (ref 8.9–10.3)
CO2: 29 mmol/L (ref 22–32)
Chloride: 91 mmol/L — ABNORMAL LOW (ref 101–111)
Creatinine, Ser: 0.96 mg/dL (ref 0.61–1.24)
GFR calc Af Amer: >60 mL/min (ref >60)
GFR calc non Af Amer: >60 mL/min (ref >60)
Glucose, Bld: 120 mg/dL — ABNORMAL HIGH (ref 65–99)
POTASSIUM: 4 mmol/L (ref 3.5–5.1)
Sodium: 131 mmol/L — ABNORMAL LOW (ref 135–145)

## 2014-12-13 LAB — CBC
HEMATOCRIT: 38.2 % — AB (ref 39.0–52.0)
Hemoglobin: 12.8 g/dL — ABNORMAL LOW (ref 13.0–17.0)
MCH: 31.5 pg (ref 26.0–34.0)
MCHC: 33.5 g/dL (ref 30.0–36.0)
MCV: 94.1 fL (ref 78.0–100.0)
PLATELETS: 525 10*3/uL — AB (ref 150–400)
RBC: 4.06 MIL/uL — ABNORMAL LOW (ref 4.22–5.81)
RDW: 12.8 % (ref 11.5–15.5)
WBC: 14.1 10*3/uL — AB (ref 4.0–10.5)

## 2014-12-13 LAB — GLUCOSE, CAPILLARY
Glucose-Capillary: 137 mg/dL — ABNORMAL HIGH (ref 65–99)
Glucose-Capillary: 151 mg/dL — ABNORMAL HIGH (ref 65–99)
Glucose-Capillary: 78 mg/dL (ref 65–99)
Glucose-Capillary: 168 mg/dL — ABNORMAL HIGH (ref 65–99)

## 2014-12-13 NOTE — Progress Notes (Addendum)
      [address]       [place] [address]             [phone]      14 Days Post-Op Procedure(s) (LRB): VIDEO BRONCHOSCOPY (N/A) VIDEO ASSISTED THORACOSCOPY (VATS)/ LOBECTOMY with on-Q pain pump placement MINI/LIMITED THORACOTOMY (Right) LOBECTOMY; right upper lobe (Right) LYMPH NODE DISSECTION (Right)   Subjective:  [name] Day complains of back pain this morning.  He states he is having a flare up and has been unable to lay in bed due to the pain.  He is ambulating.  Objective: Vital signs in last 24 hours: Temp:  [98.5 F (36.9 C)-99 F (37.2 C)] 99 F (37.2 C) (06/01 0505) Pulse Rate:  [85-96] 85 (06/01 0509) Cardiac Rhythm:  [-] Normal sinus rhythm (06/01 0509) Resp:  [19-23] 23 (05/31 1940) BP: (95-119)/(58-69) 107/69 mmHg (06/01 0509) SpO2:  [92 %-96 %] 92 % (06/01 0509)  Intake/Output from previous day: 05/31 0701 - 06/01 0700 In: 840 [P.O.:840] Out: 1570 [Urine:1500; Chest Tube:70]  General appearance: alert, cooperative and no distress Heart: regular rate and rhythm Lungs: clear to auscultation bilaterally Abdomen: soft, non-tender; bowel sounds normal; no masses,  no organomegaly Wound: clean and dry  Lab Results:  Recent Labs  12/13/14 0258  WBC 14.1*  HGB 12.8*  HCT 38.2*  PLT 525*   BMET:  Recent Labs  12/13/14 0258  NA 131*  K 4.0  CL 91*  CO2 29  GLUCOSE 120*  BUN 21*  CREATININE 0.96  CALCIUM 9.0    PT/INR: No results for input(s): LABPROT, INR in the last 72 hours. ABG    Component Value Date/Time   PHART TEST WILL BE CREDITED 11/30/2014 0500   HCO3 TEST WILL BE CREDITED 11/30/2014 0500   TCO2 TEST WILL BE CREDITED 11/30/2014 0500   ACIDBASEDEF TEST WILL BE CREDITED 11/30/2014 0500   O2SAT TEST WILL BE CREDITED 11/30/2014 0500   CBG (last 3)   Recent Labs  12/12/14 1057 12/12/14 1716 12/12/14 2114  GLUCAP 86 97 123*    Assessment/Plan: S/P Procedure(s) (LRB): VIDEO BRONCHOSCOPY (N/A) VIDEO  ASSISTED THORACOSCOPY (VATS)/ LOBECTOMY with on-Q pain pump placement MINI/LIMITED THORACOTOMY (Right) LOBECTOMY; right upper lobe (Right) LYMPH NODE DISSECTION (Right)  1. Chest tube- continues to have air leak, CXR remains relatively stable 2. Pulm- off oxygen, continue IS 3. CV- Remains hemodynamically stable 4. Pain control- states increase in medications from yesterday helped- continue MS Contin, Percocet, Fentanyl patch 5. Dispo- patient stable, continues to have air leak, would patient be a candidate for [facility]?   LOS: 14 days    BARRETT, ERIN 12/13/2014  Patient seen and examined, agree with above He still has a small air leak but it is decreased from yesterday- keep CT on water seal Pain control remains an issue  Remo Lipps C. Roxan Hockey, MD [facility] [phone]

## 2014-12-14 ENCOUNTER — Inpatient Hospital Stay (HOSPITAL_COMMUNITY): Payer: Medicare Other

## 2014-12-14 LAB — GLUCOSE, CAPILLARY
Glucose-Capillary: 113 mg/dL — ABNORMAL HIGH (ref 65–99)
Glucose-Capillary: 129 mg/dL — ABNORMAL HIGH (ref 65–99)
Glucose-Capillary: 144 mg/dL — ABNORMAL HIGH (ref 65–99)
Glucose-Capillary: 94 mg/dL (ref 65–99)
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
## 2014-12-14 NOTE — Progress Notes (Signed)
Changed [facility] to [facility]. Patient tolerated procedure well. VSS. Will continue to monitor.

## 2014-12-14 NOTE — Progress Notes (Addendum)
Pt discussed in LOS. Whitman Hero RN,BSN,CM [phone]

## 2014-12-14 NOTE — Progress Notes (Addendum)
[address]       [employer] [address]             [phone]      15 Days Post-Op Procedure(s) (LRB): VIDEO BRONCHOSCOPY (N/A) VIDEO ASSISTED THORACOSCOPY (VATS)/ LOBECTOMY with on-Q pain pump placement MINI/LIMITED THORACOTOMY (Right) LOBECTOMY; right upper lobe (Right) LYMPH NODE DISSECTION (Right) Subjective: conts to feel better overall  Objective: Vital signs in last 24 hours: Temp:  [98 F (36.7 C)-98.5 F (36.9 C)] 98.2 F (36.8 C) (06/02 0350) Pulse Rate:  [85-109] 85 (06/02 0350) Cardiac Rhythm:  [-] Normal sinus rhythm (06/02 0350) Resp:  [12-20] 15 (06/02 0350) BP: (99-116)/(52-82) 104/82 mmHg (06/02 0350) SpO2:  [95 %-99 %] 96 % (06/02 0739)  Hemodynamic parameters for last 24 hours:    Intake/Output from previous day: 06/01 0701 - 06/02 0700 In: 2160 [P.O.:2160] Out: 1750 [Urine:1700; Chest Tube:50] Intake/Output this shift:    General appearance: alert, cooperative and no distress Heart: regular rate and rhythm Lungs: clear to auscultation bilaterally Wound: incis healing well  Lab Results:  Recent Labs  12/13/14 0258  WBC 14.1*  HGB 12.8*  HCT 38.2*  PLT 525*   BMET:  Recent Labs  12/13/14 0258  NA 131*  K 4.0  CL 91*  CO2 29  GLUCOSE 120*  BUN 21*  CREATININE 0.96  CALCIUM 9.0    PT/INR: No results for input(s): LABPROT, INR in the last 72 hours. ABG    Component Value Date/Time   PHART TEST WILL BE CREDITED 11/30/2014 0500   HCO3 TEST WILL BE CREDITED 11/30/2014 0500   TCO2 TEST WILL BE CREDITED 11/30/2014 0500   ACIDBASEDEF TEST WILL BE CREDITED 11/30/2014 0500   O2SAT TEST WILL BE CREDITED 11/30/2014 0500   CBG (last 3)   Recent Labs  12/13/14 1112 12/13/14 1638 12/13/14 2241  GLUCAP 151* 78 168*    Meds Scheduled Meds: . antiseptic oral rinse  7 mL Mouth Rinse BID  . aspirin EC  81 mg Oral Daily  . bisacodyl  10 mg Oral Daily  . cholecalciferol  4,000 Units Oral Daily  . enoxaparin  (LOVENOX) injection  40 mg Subcutaneous Q24H  . fentaNYL  50 mcg Transdermal Q72H  . furosemide  40 mg Oral Daily  . glimepiride  2 mg Oral Q breakfast  . losartan  50 mg Oral Daily   And  . hydrochlorothiazide  12.5 mg Oral Daily  . insulin aspart  0-15 Units Subcutaneous TID AC & HS  . mometasone-formoterol  2 puff Inhalation BID  . morphine  60 mg Oral Q12H  . nicotine  21 mg Transdermal Daily  . potassium chloride  20 mEq Oral Daily  . pravastatin  20 mg Oral QHS  . senna-docusate  1 tablet Oral QHS   Continuous Infusions: . nitroGLYCERIN     PRN Meds:.albuterol, lactulose, levalbuterol, oxyCODONE-acetaminophen **AND** oxyCODONE, potassium chloride  Xrays Dg Chest 1v Repeat Same Day  12/12/2014   CLINICAL DATA:  Status post right upper lobectomy.  EXAM: CHEST - 1 VIEW SAME DAY  COMPARISON:  Chest radiograph 12/11/2004 the mass 619 hours.  FINDINGS: Portable AP radiograph with the patient sitting upright.  Right chest tube is in stable position, position laterally within the right hemithorax.  A right pneumothorax persists. The pneumothorax appears slightly larger compared to the chest radiograph performed earlier today, but this may be in part be positional, as the patient is sitting upright for this exam  as opposed to semi-upright for the chest radiograph performed at 6:19 hours earlier today. The pleural line is seen between the fifth and sixth posterior ribs. Surgical suture is noted in the right lung. Left lung well expanded.  No focal airspace opacities.  Small amount of pleural fluid suspected on the right.  Heart mediastinal contours are stable. No acute osseous abnormality.  IMPRESSION: Persistent right pneumothorax is slightly larger to similar in size compared to recent chest radiograph performed earlier today, with differences possibly due to differences in patient positioning. The pleural line is seen between the posterior right fifth and sixth ribs.  Stable position of right  chest tube.   Electronically Signed   By: Curlene Dolphin M.D.   On: 12/12/2014 14:50   Dg Chest Port 1 View  12/14/2014   CLINICAL DATA:  Pneumothorax  EXAM: PORTABLE CHEST - 1 VIEW  COMPARISON:  Yesterday  FINDINGS: Stable right chest tube and pneumothorax. Right paratracheal density unchanged. Left lung is clear. Normal heart size.  IMPRESSION: Stable right pneumothorax and chest tube.   Electronically Signed   By: Marybelle Killings M.D.   On: 12/14/2014 07:53   Dg Chest Port 1 View  12/13/2014   CLINICAL DATA:  Chest tube, post lobectomy, history COPD, hypertension, non-small-cell cancer RIGHT lung, diabetes mellitus  EXAM: PORTABLE CHEST - 1 VIEW  COMPARISON:  Portable exam 0615 hours compared to 12/12/2014  FINDINGS: RIGHT thoracostomy tube and RIGHT pneumothorax unchanged.  Volume loss in RIGHT chest again seen.  Upper normal heart size.  LEFT lung remains clear.  Probable component of RIGHT pleural effusion noted as well.  IMPRESSION: Persistent RIGHT hydropneumothorax despite thoracostomy tube, unchanged.   Electronically Signed   By: Lavonia Dana M.D.   On: 12/13/2014 08:11    Assessment/Plan: S/P Procedure(s) (LRB): VIDEO BRONCHOSCOPY (N/A) VIDEO ASSISTED THORACOSCOPY (VATS)/ LOBECTOMY with on-Q pain pump placement MINI/LIMITED THORACOTOMY (Right) LOBECTOMY; right upper lobe (Right) LYMPH NODE DISSECTION (Right)  1 no change in air leak/CXR on H20 seal 2 could poss go home with mini-express soon   LOS: 15 days    GOLD,WAYNE E 12/14/2014  Patient seen and examined, agree with above Still has an air leak- will place CT to a mini-xpress - if CXR OK in AM will dc with CT in place  [place] C. Roxan Hockey, MD [facility] [phone]

## 2014-12-15 ENCOUNTER — Inpatient Hospital Stay (HOSPITAL_COMMUNITY): Payer: Medicare Other

## 2014-12-15 LAB — GLUCOSE, CAPILLARY: Glucose-Capillary: 139 mg/dL — ABNORMAL HIGH (ref 65–99)

## 2014-12-15 MED ORDER — OXYCODONE-ACETAMINOPHEN 10-325 MG PO TABS: 2.0000 | ORAL_TABLET | Freq: Four times a day (QID) | ORAL | Status: DC | PRN

## 2014-12-15 MED ORDER — NICOTINE 21 MG/24HR TD PT24: 21.0000 mg | MEDICATED_PATCH | Freq: Every day | TRANSDERMAL | Status: DC

## 2014-12-15 MED ORDER — FENTANYL 50 MCG/HR TD PT72: 50.0000 ug | MEDICATED_PATCH | TRANSDERMAL | Status: DC

## 2014-12-15 NOTE — Progress Notes (Addendum)
Consult for CM to setup [facility] for mini express. CM spoke with pt  regarding HH ,choice given and pt choosed  Automotive engineer. Referral made with Butch Penny ([facility]) ,[phone]. CM  made pt aware, and services to being within a 24hr period upon d/c. No other needs identified per CM @  present time. Whitman Hero RN,BSN,CM [phone]

## 2014-12-15 NOTE — Progress Notes (Addendum)
[place]       [employer] [address]             [phone]      16 Days Post-Op Procedure(s) (LRB): VIDEO BRONCHOSCOPY (N/A) VIDEO ASSISTED THORACOSCOPY (VATS)/ LOBECTOMY with on-Q pain pump placement MINI/LIMITED THORACOTOMY (Right) LOBECTOMY; right upper lobe (Right) LYMPH NODE DISSECTION (Right) Subjective: Feels well  Objective: Vital signs in last 24 hours: Temp:  [98.1 F (36.7 C)-98.6 F (37 C)] 98.2 F (36.8 C) (06/03 0344) Pulse Rate:  [78-105] 80 (06/03 0344) Cardiac Rhythm:  [-] Normal sinus rhythm (06/02 2029) Resp:  [14-20] 16 (06/03 0344) BP: (100-113)/(58-71) 113/71 mmHg (06/03 0344) SpO2:  [93 %-97 %] 96 % (06/03 0344)  Hemodynamic parameters for last 24 hours:    Intake/Output from previous day: 06/02 0701 - 06/03 0700 In: 1440 [P.O.:1440] Out: 1525 [Urine:1525] Intake/Output this shift:    General appearance: alert, cooperative and no distress Heart: regular rate and rhythm Lungs: sl coarse Abdomen: benign Extremities: warm without edema Wound: incis healing well  Lab Results:  Recent Labs  12/13/14 0258  WBC 14.1*  HGB 12.8*  HCT 38.2*  PLT 525*   BMET:  Recent Labs  12/13/14 0258  NA 131*  K 4.0  CL 91*  CO2 29  GLUCOSE 120*  BUN 21*  CREATININE 0.96  CALCIUM 9.0    PT/INR: No results for input(s): LABPROT, INR in the last 72 hours. ABG    Component Value Date/Time   PHART TEST WILL BE CREDITED 11/30/2014 0500   HCO3 TEST WILL BE CREDITED 11/30/2014 0500   TCO2 TEST WILL BE CREDITED 11/30/2014 0500   ACIDBASEDEF TEST WILL BE CREDITED 11/30/2014 0500   O2SAT TEST WILL BE CREDITED 11/30/2014 0500   CBG (last 3)   Recent Labs  12/14/14 1127 12/14/14 1440 12/14/14 2147  GLUCAP 129* 144* 94    Meds Scheduled Meds: . antiseptic oral rinse  7 mL Mouth Rinse BID  . aspirin EC  81 mg Oral Daily  . bisacodyl  10 mg Oral Daily  . cholecalciferol  4,000 Units Oral Daily  . enoxaparin  (LOVENOX) injection  40 mg Subcutaneous Q24H  . fentaNYL  50 mcg Transdermal Q72H  . furosemide  40 mg Oral Daily  . glimepiride  2 mg Oral Q breakfast  . losartan  50 mg Oral Daily   And  . hydrochlorothiazide  12.5 mg Oral Daily  . insulin aspart  0-15 Units Subcutaneous TID AC & HS  . mometasone-formoterol  2 puff Inhalation BID  . morphine  60 mg Oral Q12H  . nicotine  21 mg Transdermal Daily  . potassium chloride  20 mEq Oral Daily  . pravastatin  20 mg Oral QHS  . senna-docusate  1 tablet Oral QHS   Continuous Infusions: . nitroGLYCERIN     PRN Meds:.albuterol, lactulose, levalbuterol, oxyCODONE-acetaminophen **AND** oxyCODONE, potassium chloride  Xrays Dg Chest Port 1 View  12/15/2014   CLINICAL DATA:  Postoperative lobectomy with pneumothorax  EXAM: PORTABLE CHEST - 1 VIEW  COMPARISON:  December 14, 2014  FINDINGS: There is postoperative change on the right with right base volume loss. Chest tube remains on the left. Left apical and medial pneumothorax remains, stable. On the left, there is slight atelectasis in the base. The left lung is otherwise clear. The heart size and pulmonary vascularity are normal. No adenopathy.  IMPRESSION: Persistent pneumothorax on the right with right chest tube position unchanged. Volume  loss right base, stable. Mild left base atelectasis. No change in cardiac silhouette.   Electronically Signed   By: [name] M.D.   On: 12/15/2014 07:21   Dg Chest Port 1 View  12/14/2014   CLINICAL DATA:  Pneumothorax  EXAM: PORTABLE CHEST - 1 VIEW  COMPARISON:  Yesterday  FINDINGS: Stable right chest tube and pneumothorax. Right paratracheal density unchanged. Left lung is clear. Normal heart size.  IMPRESSION: Stable right pneumothorax and chest tube.   Electronically Signed   By: Marybelle Killings M.D.   On: 12/14/2014 07:53    Assessment/Plan: S/P Procedure(s) (LRB): VIDEO BRONCHOSCOPY (N/A) VIDEO ASSISTED THORACOSCOPY (VATS)/ LOBECTOMY with on-Q pain pump  placement MINI/LIMITED THORACOTOMY (Right) LOBECTOMY; right upper lobe (Right) LYMPH NODE DISSECTION (Right) Stable for d/c with miniexpress   LOS: 16 days    GOLD,WAYNE E 12/15/2014  Patient seen and examined Chest x ray OK with tube to mini express, in fact apical space is a little smaller today Still has an air leak Will go home with tube Follow up with EBG next week  Remo Lipps C. Roxan Hockey, MD [facility] [phone]

## 2014-12-15 NOTE — Discharge Instructions (Signed)
Thoracotomy, Care After ° These instructions give you information on caring for yourself after your procedure. Your doctor may also give you more specific instructions. Call your doctor if you have any problems or questions after your procedure. °HOME CARE °· Only take medicine as told by your doctor. Take pain medicine before your pain gets very bad. °· Take deep breaths to protect yourself from a lung infection (pneumonia). °· Cough often to clear thick spit (mucus) and to open your lungs. Hold a pillow against your chest if it hurts to cough. °· Use your breathing machine (incentive spirometer) as told. °· Change bandages (dressings) as told by your doctor. °· Take off the bandage over your chest tube area as told by your doctor. °· Continue your normal diet as told by your doctor. °· Eat high-fiber foods. This includes whole grain cereals, brown rice, beans, and fresh fruits and vegetables. °· Drink enough fluids to keep your pee (urine) clear or pale yellow. Avoid caffeine. °· Talk to your doctor about taking a medicine to soften your poop (stool softener, laxative). °· Keep doing activities as told by your doctor. Balance your activity with rest. °· Avoid lifting until your doctor says it is okay. °· Do not drive until told by your doctor. Do not drive while taking pain medicines (narcotics). °· Do not bathe, swim, or use a hot tub until told by your doctor. You may shower. Gently wash the area of your cut (incision) with soap and water as told. °· Do not use any tobacco products including cigarettes, chewing tobacco, and electronic cigarettes. Avoid being around others who smoke. °· Schedule a doctor visit to get your stitches or staples removed as told. °· Schedule and go to all doctor visits as told. °· Go to therapy that can help improve your lungs (pulmonary rehabilitation) as told by your doctor. °· Do not travel by airplane for 2 weeks after the chest tube is taken out. °GET HELP IF: °· You are bleeding  from your wounds. °· You have redness, puffiness (swelling), or more pain in the wounds. °· You feel your heart beating fast or skipping beats (irregular heartbeat). °· You have yellowish-white fluid (pus) coming from your wounds. °· You have a bad smell coming from the wound or bandage. °· You have a fever or chills. °· You feel sick to your stomach or you throw up (vomit). °· You have muscle aches. °GET HELP RIGHT AWAY IF: °· You have a rash. °· You have trouble breathing. °· Your medicines are causing you problems. °· You keep feeling sick to your stomach (nauseous). °· You feel light-headed. °· You have shortness of breath or chest pain. °· You have pain that will not go away. °MAKE SURE YOU: °· Understand these instructions. °· Will watch your condition. °· Will get help right away if you are not doing well or get worse. °Document Released: 12/30/2011 Document Revised: 11/14/2013 Document Reviewed: 02/16/2013 °ExitCare® Patient Information ©2015 [employer]. This information is not intended to replace advice given to you by your health care provider. Make sure you discuss any questions you have with your health care provider. ° °

## 2014-12-15 NOTE — Progress Notes (Signed)
DC instructions given to patient and family. Questions answered. [place] RN set up for AM visit for chest tube care. VSS. eICU and CCMD notified of DC. PIV DC, hemostasis achieved. R chest tube attached to mini express, dressing CDI. Morning medications given, fentanyl patch placed to left shoulder, nicotine patch to right shoulder. Smoking cessation educations given. Pt to DC by wheelchair via NT to private vehicle driven by family.

## 2014-12-15 NOTE — Progress Notes (Signed)
Medicare Important Message given? YES (If response is "NO", the following Medicare IM given date fields will be blank) Date Medicare IM given:12/15/14 Medicare IM given by: Whitman Hero

## 2014-12-16 DIAGNOSIS — C3411 Malignant neoplasm of upper lobe, right bronchus or lung: Secondary | ICD-10-CM | POA: Diagnosis not present

## 2014-12-19 ENCOUNTER — Other Ambulatory Visit: Payer: Self-pay | Admitting: Cardiothoracic Surgery

## 2014-12-19 DIAGNOSIS — Z902 Acquired absence of lung [part of]: Secondary | ICD-10-CM

## 2014-12-21 ENCOUNTER — Ambulatory Visit (INDEPENDENT_AMBULATORY_CARE_PROVIDER_SITE_OTHER): Payer: Self-pay | Admitting: Cardiothoracic Surgery

## 2014-12-21 ENCOUNTER — Encounter: Payer: Self-pay | Admitting: Cardiothoracic Surgery

## 2014-12-21 ENCOUNTER — Ambulatory Visit
Admission: RE | Admit: 2014-12-21 | Discharge: 2014-12-21 | Disposition: A | Discharge status: Home / Self Care | Payer: Medicare Other | Source: Ambulatory Visit | Attending: Cardiothoracic Surgery | Admitting: Cardiothoracic Surgery

## 2014-12-21 VITALS — BP 147/79 | HR 108 | Resp 18 | Ht 65.0 in | Wt 198.0 lb

## 2014-12-21 DIAGNOSIS — Z902 Acquired absence of lung [part of]: Secondary | ICD-10-CM

## 2014-12-21 DIAGNOSIS — Z9889 Other specified postprocedural states: Secondary | ICD-10-CM

## 2014-12-21 DIAGNOSIS — J9589 Other postprocedural complications and disorders of respiratory system, not elsewhere classified: Secondary | ICD-10-CM

## 2014-12-21 DIAGNOSIS — C3411 Malignant neoplasm of upper lobe, right bronchus or lung: Secondary | ICD-10-CM

## 2014-12-21 DIAGNOSIS — J9382 Other air leak: Secondary | ICD-10-CM

## 2014-12-21 DIAGNOSIS — J95812 Postprocedural air leak: Secondary | ICD-10-CM
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
## 2014-12-22 ENCOUNTER — Other Ambulatory Visit: Payer: Self-pay | Admitting: *Deleted

## 2014-12-22 ENCOUNTER — Telehealth: Payer: Self-pay | Admitting: *Deleted

## 2014-12-22 DIAGNOSIS — C3491 Malignant neoplasm of unspecified part of right bronchus or lung: Secondary | ICD-10-CM

## 2014-12-22 NOTE — Telephone Encounter (Signed)
Oncology Nurse Navigator Documentation  Oncology Nurse Navigator Flowsheets 12/22/2014  Referral date to RadOnc/MedOnc 12/22/2014  Navigator Encounter Type Introductory phone call/Per Dr. Julien Nordmann I called patient to set up for thoracic clinic on 12/28/14 arrive at 3pm. He verbalized understanding of appt time and place.   Treatment Phase Other/Pre-Treatment  Time Spent with Patient 15

## 2014-12-28 ENCOUNTER — Encounter: Payer: Self-pay | Admitting: Internal Medicine

## 2014-12-28 ENCOUNTER — Telehealth: Payer: Self-pay | Admitting: Internal Medicine

## 2014-12-28 ENCOUNTER — Ambulatory Visit (HOSPITAL_BASED_OUTPATIENT_CLINIC_OR_DEPARTMENT_OTHER): Payer: Medicare Other | Admitting: Internal Medicine

## 2014-12-28 ENCOUNTER — Other Ambulatory Visit (HOSPITAL_BASED_OUTPATIENT_CLINIC_OR_DEPARTMENT_OTHER): Payer: Medicare Other

## 2014-12-28 ENCOUNTER — Ambulatory Visit
Admission: RE | Admit: 2014-12-28 | Discharge: 2014-12-28 | Disposition: A | Discharge status: Home / Self Care | Payer: Medicare Other | Source: Ambulatory Visit | Attending: Radiation Oncology | Admitting: Radiation Oncology

## 2014-12-28 VITALS — BP 138/77 | HR 98 | Temp 98.2°F | Resp 24 | Ht 65.0 in | Wt 190.3 lb

## 2014-12-28 DIAGNOSIS — C3411 Malignant neoplasm of upper lobe, right bronchus or lung: Secondary | ICD-10-CM

## 2014-12-28 DIAGNOSIS — E785 Hyperlipidemia, unspecified: Secondary | ICD-10-CM

## 2014-12-28 DIAGNOSIS — I10 Essential (primary) hypertension: Secondary | ICD-10-CM

## 2014-12-28 DIAGNOSIS — C3491 Malignant neoplasm of unspecified part of right bronchus or lung: Secondary | ICD-10-CM

## 2014-12-28 LAB — CBC WITH DIFFERENTIAL/PLATELET
BASO%: 0.3 % (ref 0.0–2.0)
BASOS ABS: 0 10*3/uL (ref 0.0–0.1)
EOS%: 6.2 % (ref 0.0–7.0)
Eosinophils Absolute: 0.7 10*3/uL — ABNORMAL HIGH (ref 0.0–0.5)
HCT: 41.9 % (ref 38.4–49.9)
HEMOGLOBIN: 13.5 g/dL (ref 13.0–17.1)
LYMPH#: 1.8 10*3/uL (ref 0.9–3.3)
LYMPH%: 15.6 % (ref 14.0–49.0)
MCH: 31.3 pg (ref 27.2–33.4)
MCHC: 32.2 g/dL (ref 32.0–36.0)
MCV: 97.2 fL (ref 79.3–98.0)
MONO#: 0.8 10*3/uL (ref 0.1–0.9)
MONO%: 7.2 % (ref 0.0–14.0)
NEUT#: 8.2 10*3/uL — ABNORMAL HIGH (ref 1.5–6.5)
NEUT%: 70.7 % (ref 39.0–75.0)
Platelets: 320 10*3/uL (ref 140–400)
RBC: 4.31 10*6/uL (ref 4.20–5.82)
RDW: 13.6 % (ref 11.0–14.6)
WBC: 11.6 10*3/uL — ABNORMAL HIGH (ref 4.0–10.3)

## 2014-12-28 LAB — COMPREHENSIVE METABOLIC PANEL (CC13)
ALT: 20 U/L (ref 0–55)
AST: 21 U/L (ref 5–34)
Albumin: 3.3 g/dL — ABNORMAL LOW (ref 3.5–5.0)
Alkaline Phosphatase: 86 U/L (ref 40–150)
Anion Gap: 9 mEq/L (ref 3–11)
BILIRUBIN TOTAL: 0.41 mg/dL (ref 0.20–1.20)
BUN: 12.4 mg/dL (ref 7.0–26.0)
CO2: 29 mEq/L (ref 22–29)
Calcium: 10.4 mg/dL (ref 8.4–10.4)
Chloride: 102 mEq/L (ref 98–109)
Creatinine: 1 mg/dL (ref 0.7–1.3)
EGFR: 82 mL/min/{1.73_m2} — ABNORMAL LOW (ref >90)
GLUCOSE: 153 mg/dL — AB (ref 70–140)
Potassium: 4.5 mEq/L (ref 3.5–5.1)
Sodium: 141 mEq/L (ref 136–145)
TOTAL PROTEIN: 7.8 g/dL (ref 6.4–8.3)

## 2014-12-28 MED ORDER — PROCHLORPERAZINE MALEATE 10 MG PO TABS
10.0000 mg | ORAL_TABLET | Freq: Four times a day (QID) | ORAL | Status: DC | PRN
Start: 2014-12-28 — End: 2015-01-30

## 2014-12-28 NOTE — Telephone Encounter (Signed)
Pt confirmed labs/chemo education per notes, POF was not in, sent msg to chemo to advise of NP treatment but that we couldn't do 06/21 due to the 5 day policy. Pt needs to be scheduled w/ML per 06/16 POF, checking with SD to help and advised pt he will get updated schedule when he comes in for labs/chemo edu.... KJ

## 2014-12-28 NOTE — Progress Notes (Signed)
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 thorax as above.  Subsegmental atelectasis LEFT base.   Electronically Signed   By: Lavonia Dana M.D.   On: 11/29/2014 17:53    ASSESSMENT: This is a very pleasant 69 years old white male recently diagnosed with a stage IB (T2a, N0, M0) non-small cell lung cancer, squamous cell carcinoma status post right upper lobectomy with lymph node dissection in May 2016. 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   PLAN: I had a lengthy  discussion with the patient and his family today about his current disease stage, prognosis and treatment options. I explained to the patient that there is a 5-10% 5 years survival benefit for adjuvant chemotherapy for patient with a stage IB and tumor size more than 4.0 cm. I gave the patient the option of proceeding with adjuvant chemotherapy with platinum based therapy versus observation and close monitoring. The patient is interested in proceeding with the adjuvant chemotherapy as soon as possible. I recommended for him regimen consisting of 4 cycles of carboplatin for AUC of 5 and paclitaxel 175 MG/M2 every 3 weeks with Neulasta support. He is expected to start the first cycle of this treatment next week. I discussed with the  patient adverse effect of the chemotherapy including but not limited to alopecia, myelosuppression, nausea and vomiting, peripheral neuropathy, liver or renal dysfunction. After completion of the 4 cycles of adjuvant chemotherapy, the patient will see Dr. Servando Snare for reevaluation and consideration of surgical resection of the left lower lobe lesion I will arrange for the patient to have a chemotherapy education class before starting the first dose of his chemotherapy. I will call his pharmacy with prescription for Compazine 10 mg by mouth every 6 hours as needed for nausea. He would come back for follow-up visit in 2 weeks for reevaluation and management of any adverse effect of his treatment. The patient voices understanding of current disease status and treatment options and is in agreement with the current care plan.  All questions were answered. The patient knows to call the clinic with any problems, questions or concerns. We can certainly see the patient much sooner if necessary.  Thank you so much for allowing me to participate in the care of [name] Day. I will continue to follow up the patient with you and assist in his care.  I spent 50 minutes counseling the patient face to face. The total time spent in the appointment was 80 minutes.  Disclaimer: This note was dictated with voice recognition software. Similar sounding words can inadvertently be transcribed and may not be corrected upon review.   Karlena Luebke K. December 28, 2014, 4:36 PM

## 2014-12-29 ENCOUNTER — Telehealth: Payer: Self-pay | Admitting: *Deleted

## 2014-12-29 NOTE — Telephone Encounter (Signed)
Per staff message and POF I have scheduled appts. Advised scheduler that appt moved due to 5 day authorization needed  JMW

## 2014-12-29 NOTE — Telephone Encounter (Signed)
Lft msg for pt confirming all visits in along with Chemo mailed out schedule to pt... KJ

## 2015-01-02 ENCOUNTER — Other Ambulatory Visit: Payer: Medicare Other

## 2015-01-02 ENCOUNTER — Other Ambulatory Visit (HOSPITAL_BASED_OUTPATIENT_CLINIC_OR_DEPARTMENT_OTHER): Payer: Medicare Other

## 2015-01-02 ENCOUNTER — Encounter: Payer: Self-pay | Admitting: *Deleted

## 2015-01-02 DIAGNOSIS — C3411 Malignant neoplasm of upper lobe, right bronchus or lung: Secondary | ICD-10-CM | POA: Diagnosis not present

## 2015-01-02 DIAGNOSIS — C3491 Malignant neoplasm of unspecified part of right bronchus or lung: Secondary | ICD-10-CM

## 2015-01-02 LAB — CBC WITH DIFFERENTIAL/PLATELET
BASO%: 0.7 % (ref 0.0–2.0)
Basophils Absolute: 0.1 10*3/uL (ref 0.0–0.1)
EOS%: 9.1 % — ABNORMAL HIGH (ref 0.0–7.0)
Eosinophils Absolute: 0.8 10*3/uL — ABNORMAL HIGH (ref 0.0–0.5)
HCT: 43.2 % (ref 38.4–49.9)
HGB: 14.1 g/dL (ref 13.0–17.1)
LYMPH#: 2.5 10*3/uL (ref 0.9–3.3)
LYMPH%: 29.3 % (ref 14.0–49.0)
MCH: 31.1 pg (ref 27.2–33.4)
MCHC: 32.6 g/dL (ref 32.0–36.0)
MCV: 95.4 fL (ref 79.3–98.0)
MONO#: 0.9 10*3/uL (ref 0.1–0.9)
MONO%: 11 % (ref 0.0–14.0)
NEUT%: 49.9 % (ref 39.0–75.0)
NEUTROS ABS: 4.3 10*3/uL (ref 1.5–6.5)
Platelets: 361 10*3/uL (ref 140–400)
RBC: 4.53 10*6/uL (ref 4.20–5.82)
RDW: 13.3 % (ref 11.0–14.6)
WBC: 8.5 10*3/uL (ref 4.0–10.3)

## 2015-01-02 LAB — COMPREHENSIVE METABOLIC PANEL (CC13)
ALT: 24 U/L (ref 0–55)
AST: 23 U/L (ref 5–34)
Albumin: 3.5 g/dL (ref 3.5–5.0)
Alkaline Phosphatase: 95 U/L (ref 40–150)
Anion Gap: 10 mEq/L (ref 3–11)
BILIRUBIN TOTAL: 0.53 mg/dL (ref 0.20–1.20)
BUN: 17.1 mg/dL (ref 7.0–26.0)
CO2: 28 mEq/L (ref 22–29)
Calcium: 10.4 mg/dL (ref 8.4–10.4)
Chloride: 101 mEq/L (ref 98–109)
Creatinine: 0.9 mg/dL (ref 0.7–1.3)
EGFR: 89 mL/min/{1.73_m2} — AB (ref >90)
GLUCOSE: 115 mg/dL (ref 70–140)
Potassium: 4.5 mEq/L (ref 3.5–5.1)
SODIUM: 139 meq/L (ref 136–145)
Total Protein: 8 g/dL (ref 6.4–8.3)

## 2015-01-04 ENCOUNTER — Ambulatory Visit: Payer: Medicare Other | Admitting: Cardiothoracic Surgery

## 2015-01-04 ENCOUNTER — Encounter: Payer: Self-pay | Admitting: *Deleted

## 2015-01-04 ENCOUNTER — Other Ambulatory Visit: Payer: Self-pay | Admitting: Nurse Practitioner

## 2015-01-04 ENCOUNTER — Ambulatory Visit (HOSPITAL_BASED_OUTPATIENT_CLINIC_OR_DEPARTMENT_OTHER): Payer: Medicare Other | Admitting: Nurse Practitioner

## 2015-01-04 ENCOUNTER — Ambulatory Visit: Payer: Medicare Other

## 2015-01-04 ENCOUNTER — Other Ambulatory Visit: Payer: Self-pay | Admitting: Medical Oncology

## 2015-01-04 ENCOUNTER — Other Ambulatory Visit: Payer: Self-pay | Admitting: Internal Medicine

## 2015-01-04 ENCOUNTER — Ambulatory Visit (HOSPITAL_BASED_OUTPATIENT_CLINIC_OR_DEPARTMENT_OTHER): Payer: Medicare Other

## 2015-01-04 ENCOUNTER — Encounter: Payer: Self-pay | Admitting: Nurse Practitioner

## 2015-01-04 ENCOUNTER — Other Ambulatory Visit: Payer: Self-pay | Admitting: Cardiothoracic Surgery

## 2015-01-04 VITALS — BP 120/60 | HR 88 | Temp 98.0°F | Resp 18

## 2015-01-04 DIAGNOSIS — C3411 Malignant neoplasm of upper lobe, right bronchus or lung: Secondary | ICD-10-CM

## 2015-01-04 DIAGNOSIS — Z902 Acquired absence of lung [part of]: Secondary | ICD-10-CM

## 2015-01-04 DIAGNOSIS — C3491 Malignant neoplasm of unspecified part of right bronchus or lung: Secondary | ICD-10-CM

## 2015-01-04 DIAGNOSIS — E119 Type 2 diabetes mellitus without complications: Secondary | ICD-10-CM

## 2015-01-04 DIAGNOSIS — R231 Pallor: Secondary | ICD-10-CM

## 2015-01-04 DIAGNOSIS — C3490 Malignant neoplasm of unspecified part of unspecified bronchus or lung: Secondary | ICD-10-CM

## 2015-01-04 DIAGNOSIS — Z5111 Encounter for antineoplastic chemotherapy: Secondary | ICD-10-CM | POA: Diagnosis not present

## 2015-01-04 DIAGNOSIS — G893 Neoplasm related pain (acute) (chronic): Secondary | ICD-10-CM | POA: Diagnosis not present

## 2015-01-04 LAB — WHOLE BLOOD GLUCOSE
Glucose: 276 mg/dL — ABNORMAL HIGH (ref 70–100)
HRS PC: 0 h

## 2015-01-04 MED ORDER — SODIUM CHLORIDE 0.9 % IV SOLN
Freq: Once | INTRAVENOUS | Status: AC
  Administered 2015-01-04: 09:00:00 via INTRAVENOUS

## 2015-01-04 MED ORDER — DIPHENHYDRAMINE HCL 50 MG/ML IJ SOLN
INTRAMUSCULAR | Status: AC
  Filled 2015-01-04: qty 1

## 2015-01-04 MED ORDER — DEXTROSE 5 % IV SOLN
175.0000 mg/m2 | Freq: Once | INTRAVENOUS | Status: AC
  Administered 2015-01-04: 348 mg via INTRAVENOUS
  Filled 2015-01-04: qty 58

## 2015-01-04 MED ORDER — DIPHENHYDRAMINE HCL 50 MG/ML IJ SOLN
50.0000 mg | Freq: Once | INTRAMUSCULAR | Status: AC
  Administered 2015-01-04: 50 mg via INTRAVENOUS

## 2015-01-04 MED ORDER — FAMOTIDINE IN NACL 20-0.9 MG/50ML-% IV SOLN
INTRAVENOUS | Status: AC
  Filled 2015-01-04: qty 50

## 2015-01-04 MED ORDER — ONDANSETRON HCL 40 MG/20ML IJ SOLN
Freq: Once | INTRAMUSCULAR | Status: AC
  Administered 2015-01-04: 09:00:00 via INTRAVENOUS
  Filled 2015-01-04: qty 8

## 2015-01-04 MED ORDER — FAMOTIDINE IN NACL 20-0.9 MG/50ML-% IV SOLN
20.0000 mg | Freq: Once | INTRAVENOUS | Status: AC
  Administered 2015-01-04: 20 mg via INTRAVENOUS

## 2015-01-04 MED ORDER — SODIUM CHLORIDE 0.9 % IV SOLN
556.5000 mg | Freq: Once | INTRAVENOUS | Status: AC
  Administered 2015-01-04: 560 mg via INTRAVENOUS
  Filled 2015-01-04: qty 56

## 2015-01-04 NOTE — Assessment & Plan Note (Signed)
Patient presented to the [facility] today to receive cycle one of his carboplatin/paclitaxel chemotherapy regimen.  Patient has plans to return on 01/09/2015 for labs and a follow-up visit.

## 2015-01-04 NOTE — Progress Notes (Signed)
Oncology Nurse Navigator Documentation  Oncology Nurse Navigator Flowsheets 01/04/2015  Navigator Encounter Type Initial MedOnc  Patient Visit Type Medonc  Treatment Phase First Chemo Tx/Spoke with patient today during his first chemotherapy.  He stated he has be anxious and ready to start.  I listened as he explained.  I asked if he had his nausea medication at home.  He stated yes.  I asked that he call cancer center if he had any questions or concerns  Barriers/Navigation Needs Education/Explained about nausea medication   Education Method Verbal  Support Groups/Services Friends and Family  Time Spent with Patient 15

## 2015-01-04 NOTE — Patient Instructions (Signed)
[facility] Discharge Instructions for Patients Receiving Chemotherapy  Today you received the following chemotherapy agents: Taxol and Carboplatin  To help prevent nausea and vomiting after your treatment, we encourage you to take your nausea medication: Compazine, 1 tablet every 6 hours as needed for nausea.   If you develop nausea and vomiting that is not controlled by your nausea medication, call the clinic.   BELOW ARE SYMPTOMS THAT SHOULD BE REPORTED IMMEDIATELY:  *FEVER GREATER THAN 100.5 F  *CHILLS WITH OR WITHOUT FEVER  NAUSEA AND VOMITING THAT IS NOT CONTROLLED WITH YOUR NAUSEA MEDICATION  *UNUSUAL SHORTNESS OF BREATH  *UNUSUAL BRUISING OR BLEEDING  TENDERNESS IN MOUTH AND THROAT WITH OR WITHOUT PRESENCE OF ULCERS  *URINARY PROBLEMS  *BOWEL PROBLEMS  UNUSUAL RASH Items with * indicate a potential emergency and should be followed up as soon as possible.  Feel free to call the clinic you have any questions or concerns. The clinic phone number is [phone].  Please show the [facility] at check-in to the Emergency Department and triage nurse.  Paclitaxel injection What is this medicine? PACLITAXEL (PAK li TAX el) is a chemotherapy drug. It targets fast dividing cells, like cancer cells, and causes these cells to die. This medicine is used to treat ovarian cancer, breast cancer, and other cancers. This medicine may be used for other purposes; ask your health care provider or pharmacist if you have questions. COMMON BRAND NAME(S): Onxol, Taxol What should I tell my health care provider before I take this medicine? They need to know if you have any of these conditions: -blood disorders -irregular heartbeat -infection (especially a virus infection such as chickenpox, cold sores, or herpes) -liver disease -previous or ongoing radiation therapy -an unusual or allergic reaction to paclitaxel, alcohol, polyoxyethylated castor oil, other  chemotherapy agents, other medicines, foods, dyes, or preservatives -pregnant or trying to get pregnant -breast-feeding How should I use this medicine? This drug is given as an infusion into a vein. It is administered in a hospital or clinic by a specially trained health care professional. Talk to your pediatrician regarding the use of this medicine in children. Special care may be needed. Overdosage: If you think you have taken too much of this medicine contact a poison control center or emergency room at once. NOTE: This medicine is only for you. Do not share this medicine with others. What if I miss a dose? It is important not to miss your dose. Call your doctor or health care professional if you are unable to keep an appointment. What may interact with this medicine? Do not take this medicine with any of the following medications: -disulfiram -metronidazole This medicine may also interact with the following medications: -cyclosporine -diazepam -ketoconazole -medicines to increase blood counts like filgrastim, pegfilgrastim, sargramostim -other chemotherapy drugs like cisplatin, doxorubicin, epirubicin, etoposide, teniposide, vincristine -quinidine -testosterone -vaccines -verapamil Talk to your doctor or health care professional before taking any of these medicines: -acetaminophen -aspirin -ibuprofen -ketoprofen -naproxen This list may not describe all possible interactions. Give your health care provider a list of all the medicines, herbs, non-prescription drugs, or dietary supplements you use. Also tell them if you smoke, drink alcohol, or use illegal drugs. Some items may interact with your medicine. What should I watch for while using this medicine? Your condition will be monitored carefully while you are receiving this medicine. You will need important blood work done while you are taking this medicine. This drug may make you feel generally  unwell. This is not uncommon, as  chemotherapy can affect healthy cells as well as cancer cells. Report any side effects. Continue your course of treatment even though you feel ill unless your doctor tells you to stop. In some cases, you may be given additional medicines to help with side effects. Follow all directions for their use. Call your doctor or health care professional for advice if you get a fever, chills or sore throat, or other symptoms of a cold or flu. Do not treat yourself. This drug decreases your body's ability to fight infections. Try to avoid being around people who are sick. This medicine may increase your risk to bruise or bleed. Call your doctor or health care professional if you notice any unusual bleeding. Be careful brushing and flossing your teeth or using a toothpick because you may get an infection or bleed more easily. If you have any dental work done, tell your dentist you are receiving this medicine. Avoid taking products that contain aspirin, acetaminophen, ibuprofen, naproxen, or ketoprofen unless instructed by your doctor. These medicines may hide a fever. Do not become pregnant while taking this medicine. Women should inform their doctor if they wish to become pregnant or think they might be pregnant. There is a potential for serious side effects to an unborn child. Talk to your health care professional or pharmacist for more information. Do not breast-feed an infant while taking this medicine. Men are advised not to father a child while receiving this medicine. What side effects may I notice from receiving this medicine? Side effects that you should report to your doctor or health care professional as soon as possible: -allergic reactions like skin rash, itching or hives, swelling of the face, lips, or tongue -low blood counts - This drug may decrease the number of white blood cells, red blood cells and platelets. You may be at increased risk for infections and bleeding. -signs of infection - fever or  chills, cough, sore throat, pain or difficulty passing urine -signs of decreased platelets or bleeding - bruising, pinpoint red spots on the skin, black, tarry stools, nosebleeds -signs of decreased red blood cells - unusually weak or tired, fainting spells, lightheadedness -breathing problems -chest pain -high or low blood pressure -mouth sores -nausea and vomiting -pain, swelling, redness or irritation at the injection site -pain, tingling, numbness in the hands or feet -slow or irregular heartbeat -swelling of the ankle, feet, hands Side effects that usually do not require medical attention (report to your doctor or health care professional if they continue or are bothersome): -bone pain -complete hair loss including hair on your head, underarms, pubic hair, eyebrows, and eyelashes -changes in the color of fingernails -diarrhea -loosening of the fingernails -loss of appetite -muscle or joint pain -red flush to skin -sweating This list may not describe all possible side effects. Call your doctor for medical advice about side effects. You may report side effects to FDA at 1-800-FDA-1088. Where should I keep my medicine? This drug is given in a hospital or clinic and will not be stored at home. NOTE: This sheet is a summary. It may not cover all possible information. If you have questions about this medicine, talk to your doctor, pharmacist, or health care provider.  2015, Elsevier/Gold Standard. (2012-08-23 16:41:21) Carboplatin injection What is this medicine? CARBOPLATIN (KAR boe pla tin) is a chemotherapy drug. It targets fast dividing cells, like cancer cells, and causes these cells to die. This medicine is used to treat ovarian cancer and  many other cancers. This medicine may be used for other purposes; ask your health care provider or pharmacist if you have questions. COMMON BRAND NAME(S): Paraplatin What should I tell my health care provider before I take this medicine? They  need to know if you have any of these conditions: -blood disorders -hearing problems -kidney disease -recent or ongoing radiation therapy -an unusual or allergic reaction to carboplatin, cisplatin, other chemotherapy, other medicines, foods, dyes, or preservatives -pregnant or trying to get pregnant -breast-feeding How should I use this medicine? This drug is usually given as an infusion into a vein. It is administered in a hospital or clinic by a specially trained health care professional. Talk to your pediatrician regarding the use of this medicine in children. Special care may be needed. Overdosage: If you think you have taken too much of this medicine contact a poison control center or emergency room at once. NOTE: This medicine is only for you. Do not share this medicine with others. What if I miss a dose? It is important not to miss a dose. Call your doctor or health care professional if you are unable to keep an appointment. What may interact with this medicine? -medicines for seizures -medicines to increase blood counts like filgrastim, pegfilgrastim, sargramostim -some antibiotics like amikacin, gentamicin, neomycin, streptomycin, tobramycin -vaccines Talk to your doctor or health care professional before taking any of these medicines: -acetaminophen -aspirin -ibuprofen -ketoprofen -naproxen This list may not describe all possible interactions. Give your health care provider a list of all the medicines, herbs, non-prescription drugs, or dietary supplements you use. Also tell them if you smoke, drink alcohol, or use illegal drugs. Some items may interact with your medicine. What should I watch for while using this medicine? Your condition will be monitored carefully while you are receiving this medicine. You will need important blood work done while you are taking this medicine. This drug may make you feel generally unwell. This is not uncommon, as chemotherapy can affect healthy  cells as well as cancer cells. Report any side effects. Continue your course of treatment even though you feel ill unless your doctor tells you to stop. In some cases, you may be given additional medicines to help with side effects. Follow all directions for their use. Call your doctor or health care professional for advice if you get a fever, chills or sore throat, or other symptoms of a cold or flu. Do not treat yourself. This drug decreases your body's ability to fight infections. Try to avoid being around people who are sick. This medicine may increase your risk to bruise or bleed. Call your doctor or health care professional if you notice any unusual bleeding. Be careful brushing and flossing your teeth or using a toothpick because you may get an infection or bleed more easily. If you have any dental work done, tell your dentist you are receiving this medicine. Avoid taking products that contain aspirin, acetaminophen, ibuprofen, naproxen, or ketoprofen unless instructed by your doctor. These medicines may hide a fever. Do not become pregnant while taking this medicine. Women should inform their doctor if they wish to become pregnant or think they might be pregnant. There is a potential for serious side effects to an unborn child. Talk to your health care professional or pharmacist for more information. Do not breast-feed an infant while taking this medicine. What side effects may I notice from receiving this medicine? Side effects that you should report to your doctor or health care professional as  soon as possible: -allergic reactions like skin rash, itching or hives, swelling of the face, lips, or tongue -signs of infection - fever or chills, cough, sore throat, pain or difficulty passing urine -signs of decreased platelets or bleeding - bruising, pinpoint red spots on the skin, black, tarry stools, nosebleeds -signs of decreased red blood cells - unusually weak or tired, fainting spells,  lightheadedness -breathing problems -changes in hearing -changes in vision -chest pain -high blood pressure -low blood counts - This drug may decrease the number of white blood cells, red blood cells and platelets. You may be at increased risk for infections and bleeding. -nausea and vomiting -pain, swelling, redness or irritation at the injection site -pain, tingling, numbness in the hands or feet -problems with balance, talking, walking -trouble passing urine or change in the amount of urine Side effects that usually do not require medical attention (report to your doctor or health care professional if they continue or are bothersome): -hair loss -loss of appetite -metallic taste in the mouth or changes in taste This list may not describe all possible side effects. Call your doctor for medical advice about side effects. You may report side effects to FDA at 1-800-FDA-1088. Where should I keep my medicine? This drug is given in a hospital or clinic and will not be stored at home. NOTE: This sheet is a summary. It may not cover all possible information. If you have questions about this medicine, talk to your doctor, pharmacist, or health care provider.  2015, [employer]/Gold Standard. (2007-10-05 14:38:05)

## 2015-01-04 NOTE — Assessment & Plan Note (Signed)
Patient states he is recovering fairly well status post recent lobectomy; but continues with pain.  He takes Opana extended release 30 mg twice daily; and Percocet 10/325 tablets for breakthrough pain.   Patient stated that he had some additional pain while at the [facility] today; and took some of his on pain medication from home.
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
## 2015-01-04 NOTE — Progress Notes (Signed)
11:30: Patient reports feeling very hot, patient pale and diaphoretic.  Taxol infusion paused; Normal Saline infusion started.  Vital signs stable.  Patient states he forgot to check his blood sugar this morning and had taken his blood sugar medicine by mouth. Patient given orange juice to drink along with some crackers and peanut butter.  Patient also reports back pain and states it is time for his pain medication.  Patient brought pain meds with him and took pain meds at this time. Selena Lesser, NP notified, order received to check patient blood sugar and NP will come assess patient.  12:00: Patient states he is feeling much better, color returned to normal, skin dry. CBG: 276.  Selena Lesser, NP notified and okay to restart Taxol at same rate. Vitals stable. Will continue to monitor patient closely.  1450: Selena Lesser, NP at chairside for patient reassessment. Pt continues to do well without any further complaints.   1455: Pt discharged with niece in no apparent distress, ambulatory without complaints.

## 2015-01-04 NOTE — Assessment & Plan Note (Signed)
Patient has previous diagnosis of diabetes; and typically takes Amaryl oral medication on a regular basis. He states that he did take his .  Diabetic medication this morning; but forgot to check his blood sugar.     In the midst of  The paclitaxel portion of his chemotherapy.  He developed some nausea , diaphoresis, and increased weakness. Vital signs remained stable. Patient was given a snack an orange juice. After patient had finished a snack-patient felt much better.  Blood sugar checked was 276. Patient was able to continue with his chemotherapy as planned.

## 2015-01-05 ENCOUNTER — Encounter: Payer: Self-pay | Admitting: Cardiothoracic Surgery

## 2015-01-05 ENCOUNTER — Ambulatory Visit (INDEPENDENT_AMBULATORY_CARE_PROVIDER_SITE_OTHER): Payer: Self-pay | Admitting: Cardiothoracic Surgery

## 2015-01-05 ENCOUNTER — Ambulatory Visit
Admission: RE | Admit: 2015-01-05 | Discharge: 2015-01-05 | Disposition: A | Discharge status: Home / Self Care | Payer: Medicare Other | Source: Ambulatory Visit | Attending: Cardiothoracic Surgery | Admitting: Cardiothoracic Surgery

## 2015-01-05 ENCOUNTER — Telehealth: Payer: Self-pay

## 2015-01-05 VITALS — BP 129/80 | HR 108 | Resp 20 | Ht 65.0 in | Wt 190.0 lb

## 2015-01-05 DIAGNOSIS — C3411 Malignant neoplasm of upper lobe, right bronchus or lung: Secondary | ICD-10-CM

## 2015-01-05 DIAGNOSIS — Z9889 Other specified postprocedural states: Secondary | ICD-10-CM

## 2015-01-05 DIAGNOSIS — Z902 Acquired absence of lung [part of]: Secondary | ICD-10-CM

## 2015-01-05 NOTE — Telephone Encounter (Signed)
-----   Message from Carolin Guernsey, RN sent at 01/04/2015  2:26 PM EDT ----- Regarding: MM: Chemo Follow up Call Patient of Dr. Julien Nordmann. First time Taxol and Carboplatin, tolerated well.
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
## 2015-01-05 NOTE — Telephone Encounter (Signed)
Called to follow up with pt after seen by Cyndee Bacon,NP yesterday in tx d/t BS issue. Left message for pt to call back.

## 2015-01-05 NOTE — Telephone Encounter (Signed)
Pt states he is a little queasy but took compazine and it helped. He is eating and drinking H2O. Has no complaints. He is aware of injection appt tomorrow. "I don't have to words to tell you how much I appreciate all you are doing for me. You all have treated me real well"

## 2015-01-06 ENCOUNTER — Ambulatory Visit (HOSPITAL_BASED_OUTPATIENT_CLINIC_OR_DEPARTMENT_OTHER): Payer: Medicare Other

## 2015-01-06 VITALS — BP 134/89 | HR 100 | Temp 98.8°F | Resp 18

## 2015-01-06 DIAGNOSIS — C3411 Malignant neoplasm of upper lobe, right bronchus or lung: Secondary | ICD-10-CM

## 2015-01-06 DIAGNOSIS — C3491 Malignant neoplasm of unspecified part of right bronchus or lung: Secondary | ICD-10-CM

## 2015-01-06 DIAGNOSIS — Z5189 Encounter for other specified aftercare: Secondary | ICD-10-CM

## 2015-01-06 MED ORDER — PEGFILGRASTIM INJECTION 6 MG/0.6ML ~~LOC~~
6.0000 mg | PREFILLED_SYRINGE | Freq: Once | SUBCUTANEOUS | Status: AC
Start: 2015-01-06 — End: 2015-01-06
  Administered 2015-01-06: 6 mg via SUBCUTANEOUS

## 2015-01-06 NOTE — Patient Instructions (Signed)
Pegfilgrastim injection What is this medicine? PEGFILGRASTIM (peg fil GRA stim) is a long-acting granulocyte colony-stimulating factor that stimulates the growth of neutrophils, a type of white blood cell important in the body's fight against infection. It is used to reduce the incidence of fever and infection in patients with certain types of cancer who are receiving chemotherapy that affects the bone marrow. This medicine may be used for other purposes; ask your health care provider or pharmacist if you have questions. COMMON BRAND NAME(S): Neulasta What should I tell my health care provider before I take this medicine? They need to know if you have any of these conditions: -latex allergy -ongoing radiation therapy -sickle cell disease -skin reactions to acrylic adhesives (On-Body Injector only) -an unusual or allergic reaction to pegfilgrastim, filgrastim, other medicines, foods, dyes, or preservatives -pregnant or trying to get pregnant -breast-feeding How should I use this medicine? This medicine is for injection under the skin. If you get this medicine at home, you will be taught how to prepare and give the pre-filled syringe or how to use the On-body Injector. Refer to the patient Instructions for Use for detailed instructions. Use exactly as directed. Take your medicine at regular intervals. Do not take your medicine more often than directed. It is important that you put your used needles and syringes in a special sharps container. Do not put them in a trash can. If you do not have a sharps container, call your pharmacist or healthcare provider to get one. Talk to your pediatrician regarding the use of this medicine in children. Special care may be needed. Overdosage: If you think you have taken too much of this medicine contact a poison control center or emergency room at once. NOTE: This medicine is only for you. Do not share this medicine with others. What if I miss a dose? It is  important not to miss your dose. Call your doctor or health care professional if you miss your dose. If you miss a dose due to an On-body Injector failure or leakage, a new dose should be administered as soon as possible using a single prefilled syringe for manual use. What may interact with this medicine? Interactions have not been studied. Give your health care provider a list of all the medicines, herbs, non-prescription drugs, or dietary supplements you use. Also tell them if you smoke, drink alcohol, or use illegal drugs. Some items may interact with your medicine. This list may not describe all possible interactions. Give your health care provider a list of all the medicines, herbs, non-prescription drugs, or dietary supplements you use. Also tell them if you smoke, drink alcohol, or use illegal drugs. Some items may interact with your medicine. What should I watch for while using this medicine? You may need blood work done while you are taking this medicine. If you are going to need a MRI, CT scan, or other procedure, tell your doctor that you are using this medicine (On-Body Injector only). What side effects may I notice from receiving this medicine? Side effects that you should report to your doctor or health care professional as soon as possible: -allergic reactions like skin rash, itching or hives, swelling of the face, lips, or tongue -dizziness -fever -pain, redness, or irritation at site where injected -pinpoint red spots on the skin -shortness of breath or breathing problems -stomach or side pain, or pain at the shoulder -swelling -tiredness -trouble passing urine Side effects that usually do not require medical attention (report to your doctor   or health care professional if they continue or are bothersome): -bone pain -muscle pain This list may not describe all possible side effects. Call your doctor for medical advice about side effects. You may report side effects to FDA at  1-800-FDA-1088. Where should I keep my medicine? Keep out of the reach of children. Store pre-filled syringes in a refrigerator between 2 and 8 degrees C (36 and 46 degrees F). Do not freeze. Keep in carton to protect from light. Throw away this medicine if it is left out of the refrigerator for more than 48 hours. Throw away any unused medicine after the expiration date. NOTE: This sheet is a summary. It may not cover all possible information. If you have questions about this medicine, talk to your doctor, pharmacist, or health care provider.  2015, Elsevier/Gold Standard. (2013-09-29 16:14:05)  

## 2015-01-09 ENCOUNTER — Ambulatory Visit (HOSPITAL_BASED_OUTPATIENT_CLINIC_OR_DEPARTMENT_OTHER): Payer: Medicare Other | Admitting: Nurse Practitioner

## 2015-01-09 ENCOUNTER — Other Ambulatory Visit (HOSPITAL_BASED_OUTPATIENT_CLINIC_OR_DEPARTMENT_OTHER): Payer: Medicare Other

## 2015-01-09 VITALS — BP 135/69 | HR 115 | Temp 98.4°F | Resp 18 | Ht 65.0 in | Wt 182.1 lb

## 2015-01-09 DIAGNOSIS — C3411 Malignant neoplasm of upper lobe, right bronchus or lung: Secondary | ICD-10-CM

## 2015-01-09 DIAGNOSIS — M545 Low back pain: Secondary | ICD-10-CM | POA: Diagnosis not present

## 2015-01-09 DIAGNOSIS — M898X9 Other specified disorders of bone, unspecified site: Secondary | ICD-10-CM | POA: Diagnosis not present

## 2015-01-09 DIAGNOSIS — C3491 Malignant neoplasm of unspecified part of right bronchus or lung: Secondary | ICD-10-CM

## 2015-01-09 LAB — CBC WITH DIFFERENTIAL/PLATELET
BASO%: 0.3 % (ref 0.0–2.0)
Basophils Absolute: 0.1 10*3/uL (ref 0.0–0.1)
EOS ABS: 0.8 10*3/uL — AB (ref 0.0–0.5)
EOS%: 3 % (ref 0.0–7.0)
HCT: 42.4 % (ref 38.4–49.9)
HEMOGLOBIN: 14.2 g/dL (ref 13.0–17.1)
LYMPH%: 6.1 % — AB (ref 14.0–49.0)
MCH: 31.9 pg (ref 27.2–33.4)
MCHC: 33.5 g/dL (ref 32.0–36.0)
MCV: 95.3 fL (ref 79.3–98.0)
MONO#: 0.5 10*3/uL (ref 0.1–0.9)
MONO%: 1.8 % (ref 0.0–14.0)
NEUT%: 88.8 % — ABNORMAL HIGH (ref 39.0–75.0)
NEUTROS ABS: 22.3 10*3/uL — AB (ref 1.5–6.5)
PLATELETS: 299 10*3/uL (ref 140–400)
RBC: 4.45 10*6/uL (ref 4.20–5.82)
RDW: 13.3 % (ref 11.0–14.6)
WBC: 25.1 10*3/uL — AB (ref 4.0–10.3)
lymph#: 1.5 10*3/uL (ref 0.9–3.3)

## 2015-01-09 LAB — COMPREHENSIVE METABOLIC PANEL (CC13)
ALBUMIN: 3.8 g/dL (ref 3.5–5.0)
ALT: 15 U/L (ref 0–55)
AST: 17 U/L (ref 5–34)
Alkaline Phosphatase: 157 U/L — ABNORMAL HIGH (ref 40–150)
Anion Gap: 9 mEq/L (ref 3–11)
BUN: 11.5 mg/dL (ref 7.0–26.0)
CALCIUM: 10.1 mg/dL (ref 8.4–10.4)
CHLORIDE: 97 meq/L — AB (ref 98–109)
CO2: 29 mEq/L (ref 22–29)
Creatinine: 0.9 mg/dL (ref 0.7–1.3)
EGFR: 88 mL/min/{1.73_m2} — AB (ref >90)
GLUCOSE: 147 mg/dL — AB (ref 70–140)
POTASSIUM: 4.8 meq/L (ref 3.5–5.1)
SODIUM: 135 meq/L — AB (ref 136–145)
Total Bilirubin: 0.78 mg/dL (ref 0.20–1.20)
Total Protein: 7.7 g/dL (ref 6.4–8.3)

## 2015-01-09 NOTE — Progress Notes (Signed)
  [facility] OFFICE PROGRESS NOTE   Diagnosis:  Lung cancer  INTERVAL HISTORY:   Mr. Urbani returns as scheduled. He completed cycle 1 Taxol/carboplatin 01/04/2015. He denies nausea/vomiting. No mouth sores. No diarrhea. No change in baseline dyspnea on exertion. No fever. Most notably he developed diffuse bone pain about 3 days ago. He thinks the pain began before he got the Neulasta injection but he has not entirely sure. He takes Opana and Percocet routinely for chronic back pain and notes no relief of the bone pain.  Objective:  Vital signs in last 24 hours:  Blood pressure 135/69, pulse 115, temperature 98.4 F (36.9 C), temperature source Oral, resp. rate 18, height '5\' 5"'$  (1.651 m), weight 182 lb 1.6 oz (82.6 kg), SpO2 97 %. repeat heart rate 100    HEENT: No thrush or ulcers. Mucous membranes are moist. Resp: Distant breath sounds. Cardio: Distant heart sounds. Regular rate and rhythm. GI: Abdomen soft and nontender. No hepatomegaly. Vascular: No leg edema. Skin: No rash.    Lab Results:  Lab Results  Component Value Date   WBC 25.1* 01/09/2015   HGB 14.2 01/09/2015   HCT 42.4 01/09/2015   MCV 95.3 01/09/2015   PLT 299 01/09/2015   NEUTROABS 22.3* 01/09/2015    Imaging:  No results found.  Medications: I have reviewed the patient's current medications.  Assessment/Plan: 1. Stage IB (T2a, N0, M0) non-small cell lung cancer, squamous cell carcinoma status post right upper lobectomy with lymph node dissection in May 2016. 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 The plan is for adjuvant chemotherapy followed by consideration of surgical resection of the left lower lobe lesion. Cycle 1 carboplatin/Taxol 01/04/2015. 2. Diffuse bone pain secondary to Taxol and/or Neulasta.   Disposition: Mr. Kegley has completed 1 cycle of adjuvant carboplatin/Taxol. He is experiencing diffuse bone pain secondary to the Taxol and/or  Neulasta. He takes narcotics chronically for back pain. He has noted no relief of the bone pain despite this. He will try ibuprofen. He will also try taking Claritin with cycle 2 Taxol/carboplatin.  He will return for a follow-up visit prior to cycle 2 on 01/23/2015. He will contact the office in the interim with any problems    Ned Card ANP/GNP-BC   01/09/2015  12:37 PM

## 2015-01-11 ENCOUNTER — Encounter: Payer: Self-pay | Admitting: *Deleted

## 2015-01-11 NOTE — Progress Notes (Signed)
[place] Psychosocial Distress Screening Clinical Social Work  Clinical Social Work was referred by distress screening protocol.  The patient scored a 6 on the Psychosocial Distress Thermometer which indicates moderate distress. Clinical Social Worker contacted patient to assess for distress and other psychosocial needs. Patient presented short of breath, CSW had difficulty understanding patient.  [name] Day shared he was short of breath due to searching for phone.  He shared he feels he's coping adequately with cancer diagnosis and has no concerns at this time.  CSW encouraged patient to call if he has any questions/concerns.  ONCBCN DISTRESS SCREENING 01/09/2015  Screening Type Initial Screening  Distress experienced in past week (1-10) 6  Physical Problem type Pain  Physician notified of physical symptoms Yes    Clinical Social Worker follow up needed: No.  If yes, follow up plan:  Polo Riley, MSW, LCSW, OSW-C Clinical Social Worker [facility] [phone]

## 2015-01-15 ENCOUNTER — Inpatient Hospital Stay (HOSPITAL_COMMUNITY)
Admission: EM | Admit: 2015-01-15 | Discharge: 2015-01-20 | DRG: 186 | Disposition: A | Discharge status: Home Health | Payer: Medicare Other | Attending: Cardiothoracic Surgery | Admitting: Cardiothoracic Surgery

## 2015-01-15 ENCOUNTER — Emergency Department (HOSPITAL_COMMUNITY): Payer: Medicare Other

## 2015-01-15 ENCOUNTER — Encounter (HOSPITAL_COMMUNITY): Payer: Self-pay | Admitting: Nurse Practitioner

## 2015-01-15 DIAGNOSIS — J90 Pleural effusion, not elsewhere classified: Secondary | ICD-10-CM | POA: Diagnosis present

## 2015-01-15 DIAGNOSIS — C3411 Malignant neoplasm of upper lobe, right bronchus or lung: Secondary | ICD-10-CM | POA: Diagnosis present

## 2015-01-15 DIAGNOSIS — J189 Pneumonia, unspecified organism: Secondary | ICD-10-CM | POA: Diagnosis present

## 2015-01-15 DIAGNOSIS — Z87891 Personal history of nicotine dependence: Secondary | ICD-10-CM | POA: Diagnosis not present

## 2015-01-15 DIAGNOSIS — Z79891 Long term (current) use of opiate analgesic: Secondary | ICD-10-CM | POA: Diagnosis not present

## 2015-01-15 DIAGNOSIS — R0602 Shortness of breath: Secondary | ICD-10-CM

## 2015-01-15 DIAGNOSIS — Z801 Family history of malignant neoplasm of trachea, bronchus and lung: Secondary | ICD-10-CM

## 2015-01-15 DIAGNOSIS — J432 Centrilobular emphysema: Secondary | ICD-10-CM | POA: Diagnosis not present

## 2015-01-15 DIAGNOSIS — Z79899 Other long term (current) drug therapy: Secondary | ICD-10-CM | POA: Diagnosis not present

## 2015-01-15 DIAGNOSIS — E119 Type 2 diabetes mellitus without complications: Secondary | ICD-10-CM | POA: Diagnosis present

## 2015-01-15 DIAGNOSIS — Z933 Colostomy status: Secondary | ICD-10-CM | POA: Diagnosis not present

## 2015-01-15 DIAGNOSIS — Z7982 Long term (current) use of aspirin: Secondary | ICD-10-CM | POA: Diagnosis not present

## 2015-01-15 DIAGNOSIS — J939 Pneumothorax, unspecified: Secondary | ICD-10-CM

## 2015-01-15 DIAGNOSIS — J948 Other specified pleural conditions: Principal | ICD-10-CM | POA: Insufficient documentation

## 2015-01-15 DIAGNOSIS — A419 Sepsis, unspecified organism: Secondary | ICD-10-CM

## 2015-01-15 DIAGNOSIS — J9383 Other pneumothorax: Secondary | ICD-10-CM | POA: Diagnosis not present

## 2015-01-15 DIAGNOSIS — E780 Pure hypercholesterolemia: Secondary | ICD-10-CM | POA: Diagnosis present

## 2015-01-15 DIAGNOSIS — J869 Pyothorax without fistula: Secondary | ICD-10-CM | POA: Diagnosis present

## 2015-01-15 DIAGNOSIS — G8929 Other chronic pain: Secondary | ICD-10-CM | POA: Diagnosis present

## 2015-01-15 DIAGNOSIS — Z902 Acquired absence of lung [part of]: Secondary | ICD-10-CM | POA: Diagnosis present

## 2015-01-15 DIAGNOSIS — Z9221 Personal history of antineoplastic chemotherapy: Secondary | ICD-10-CM | POA: Diagnosis not present

## 2015-01-15 DIAGNOSIS — I10 Essential (primary) hypertension: Secondary | ICD-10-CM | POA: Diagnosis present

## 2015-01-15 DIAGNOSIS — J449 Chronic obstructive pulmonary disease, unspecified: Secondary | ICD-10-CM | POA: Diagnosis present

## 2015-01-15 DIAGNOSIS — Z9689 Presence of other specified functional implants: Secondary | ICD-10-CM

## 2015-01-15 LAB — COMPREHENSIVE METABOLIC PANEL
ALT: 29 U/L (ref 17–63)
AST: 35 U/L (ref 15–41)
Albumin: 3.3 g/dL — ABNORMAL LOW (ref 3.5–5.0)
Alkaline Phosphatase: 110 U/L (ref 38–126)
Anion gap: 10 (ref 5–15)
BUN: 10 mg/dL (ref 6–20)
CALCIUM: 9.4 mg/dL (ref 8.9–10.3)
CO2: 27 mmol/L (ref 22–32)
Chloride: 98 mmol/L — ABNORMAL LOW (ref 101–111)
Creatinine, Ser: 0.73 mg/dL (ref 0.61–1.24)
GFR calc Af Amer: >60 mL/min (ref >60)
Glucose, Bld: 122 mg/dL — ABNORMAL HIGH (ref 65–99)
Potassium: 3.6 mmol/L (ref 3.5–5.1)
Sodium: 135 mmol/L (ref 135–145)
Total Bilirubin: 0.4 mg/dL (ref 0.3–1.2)
Total Protein: 7.7 g/dL (ref 6.5–8.1)

## 2015-01-15 LAB — CBC WITH DIFFERENTIAL/PLATELET
Basophils Absolute: 0 10*3/uL (ref 0.0–0.1)
Basophils Relative: 0 % (ref 0–1)
EOS ABS: 0.2 10*3/uL (ref 0.0–0.7)
Eosinophils Relative: 1 % (ref 0–5)
HCT: 38.3 % — ABNORMAL LOW (ref 39.0–52.0)
HEMOGLOBIN: 12.6 g/dL — AB (ref 13.0–17.0)
Lymphocytes Relative: 9 % — ABNORMAL LOW (ref 12–46)
Lymphs Abs: 1.4 10*3/uL (ref 0.7–4.0)
MCH: 31 pg (ref 26.0–34.0)
MCHC: 32.9 g/dL (ref 30.0–36.0)
MCV: 94.1 fL (ref 78.0–100.0)
MONO ABS: 1.4 10*3/uL — AB (ref 0.1–1.0)
Monocytes Relative: 9 % (ref 3–12)
NEUTROS PCT: 81 % — AB (ref 43–77)
Neutro Abs: 12.8 10*3/uL — ABNORMAL HIGH (ref 1.7–7.7)
Platelets: 259 10*3/uL (ref 150–400)
RBC: 4.07 MIL/uL — ABNORMAL LOW (ref 4.22–5.81)
RDW: 13.3 % (ref 11.5–15.5)
WBC: 15.8 10*3/uL — AB (ref 4.0–10.5)

## 2015-01-15 LAB — URINALYSIS, ROUTINE W REFLEX MICROSCOPIC
Bilirubin Urine: NEGATIVE
Glucose, UA: NEGATIVE mg/dL
HGB URINE DIPSTICK: NEGATIVE
Ketones, ur: NEGATIVE mg/dL
LEUKOCYTES UA: NEGATIVE
Nitrite: NEGATIVE
PH: 7.5 (ref 5.0–8.0)
PROTEIN: NEGATIVE mg/dL
Specific Gravity, Urine: 1.02 (ref 1.005–1.030)
UROBILINOGEN UA: 4 mg/dL — AB (ref 0.0–1.0)

## 2015-01-15 LAB — I-STAT CG4 LACTIC ACID, ED: Lactic Acid, Venous: 0.9 mmol/L (ref 0.5–2.0)

## 2015-01-15 MED ORDER — ACETAMINOPHEN 500 MG PO TABS
1000.0000 mg | ORAL_TABLET | Freq: Once | ORAL | Status: AC
  Administered 2015-01-15: 1000 mg via ORAL
  Filled 2015-01-15: qty 2

## 2015-01-15 MED ORDER — HYDROMORPHONE HCL 1 MG/ML IJ SOLN
1.0000 mg | Freq: Once | INTRAMUSCULAR | Status: AC
  Administered 2015-01-15: 1 mg via INTRAVENOUS
  Filled 2015-01-15: qty 1

## 2015-01-15 MED ORDER — OXYCODONE HCL 5 MG PO TABS
5.0000 mg | ORAL_TABLET | ORAL | Status: DC | PRN
  Administered 2015-01-16: 5 mg via ORAL
  Filled 2015-01-15: qty 1

## 2015-01-15 MED ORDER — ENOXAPARIN SODIUM 40 MG/0.4ML ~~LOC~~ SOLN
40.0000 mg | SUBCUTANEOUS | Status: DC
  Filled 2015-01-15: qty 0.4

## 2015-01-15 MED ORDER — SODIUM CHLORIDE 0.9 % IJ SOLN
3.0000 mL | Freq: Two times a day (BID) | INTRAMUSCULAR | Status: DC
  Administered 2015-01-16 – 2015-01-19 (×7): 3 mL via INTRAVENOUS

## 2015-01-15 MED ORDER — DOCUSATE SODIUM 100 MG PO CAPS
100.0000 mg | ORAL_CAPSULE | Freq: Two times a day (BID) | ORAL | Status: DC
  Administered 2015-01-16 – 2015-01-19 (×8): 100 mg via ORAL
  Filled 2015-01-15 (×9): qty 1

## 2015-01-15 MED ORDER — SORBITOL 70 % SOLN
30.0000 mL | Freq: Every day | Status: DC | PRN
  Filled 2015-01-15: qty 30

## 2015-01-15 MED ORDER — ACETAMINOPHEN 650 MG RE SUPP: 650.0000 mg | Freq: Four times a day (QID) | RECTAL | Status: DC | PRN

## 2015-01-15 MED ORDER — ALUM & MAG HYDROXIDE-SIMETH 200-200-20 MG/5ML PO SUSP: 30.0000 mL | Freq: Four times a day (QID) | ORAL | Status: DC | PRN

## 2015-01-15 MED ORDER — ONDANSETRON HCL 4 MG PO TABS: 4.0000 mg | ORAL_TABLET | Freq: Four times a day (QID) | ORAL | Status: DC | PRN

## 2015-01-15 MED ORDER — ACETAMINOPHEN 325 MG PO TABS
650.0000 mg | ORAL_TABLET | Freq: Four times a day (QID) | ORAL | Status: DC | PRN
  Administered 2015-01-16: 650 mg via ORAL
  Filled 2015-01-15 (×2): qty 2

## 2015-01-15 MED ORDER — DEXTROSE-NACL 5-0.45 % IV SOLN: INTRAVENOUS | Status: DC

## 2015-01-15 MED ORDER — SODIUM CHLORIDE 0.9 % IV BOLUS (SEPSIS)
2500.0000 mL | Freq: Once | INTRAVENOUS | Status: AC
Start: 2015-01-15 — End: 2015-01-16
  Administered 2015-01-15: 2500 mL via INTRAVENOUS

## 2015-01-15 MED ORDER — PIPERACILLIN-TAZOBACTAM 3.375 G IVPB
3.3750 g | Freq: Three times a day (TID) | INTRAVENOUS | Status: DC
  Administered 2015-01-16 – 2015-01-20 (×13): 3.375 g via INTRAVENOUS
  Filled 2015-01-15 (×15): qty 50

## 2015-01-15 MED ORDER — ONDANSETRON HCL 4 MG/2ML IJ SOLN: 4.0000 mg | Freq: Four times a day (QID) | INTRAMUSCULAR | Status: DC | PRN

## 2015-01-15 MED ORDER — PIPERACILLIN-TAZOBACTAM 3.375 G IVPB 30 MIN
3.3750 g | Freq: Once | INTRAVENOUS | Status: AC
  Administered 2015-01-15: 3.375 g via INTRAVENOUS
  Filled 2015-01-15: qty 50

## 2015-01-15 MED ORDER — VANCOMYCIN HCL IN DEXTROSE 1-5 GM/200ML-% IV SOLN
1000.0000 mg | Freq: Two times a day (BID) | INTRAVENOUS | Status: DC
  Administered 2015-01-15 – 2015-01-17 (×5): 1000 mg via INTRAVENOUS
  Filled 2015-01-15 (×5): qty 200

## 2015-01-15 MED ORDER — ADULT MULTIVITAMIN W/MINERALS CH
1.0000 | ORAL_TABLET | Freq: Every day | ORAL | Status: DC
  Administered 2015-01-16 – 2015-01-20 (×5): 1 via ORAL
  Filled 2015-01-15 (×5): qty 1

## 2015-01-15 MED ORDER — ASPIRIN EC 81 MG PO TBEC
81.0000 mg | DELAYED_RELEASE_TABLET | Freq: Every day | ORAL | Status: DC
  Administered 2015-01-16 – 2015-01-20 (×5): 81 mg via ORAL
  Filled 2015-01-15 (×6): qty 1

## 2015-01-15 NOTE — ED Notes (Signed)
Pt is presented by family members, c/o sudden onset of shortness of breath with fever, pt is lung cancer pt with ongoing chemo treatments, last chemo 2 weeks ago. Denies chest pain, n/v/d.

## 2015-01-15 NOTE — ED Notes (Signed)
Received report from Van

## 2015-01-15 NOTE — ED Notes (Signed)
Patient transported to X-ray 

## 2015-01-15 NOTE — ED Notes (Signed)
Provider at bedside

## 2015-01-15 NOTE — ED Notes (Signed)
Patient and family members states patient became short of breath last night. He applied his spouses oxygen on via [place] last night. Today, he progressively got more short of breath. Pt's family member reports fever 99.7 oral at home.

## 2015-01-15 NOTE — ED Notes (Signed)
Placed patient on oxgen via [facility] at [place].

## 2015-01-15 NOTE — ED Notes (Signed)
Dr. Nils Pyle at bedside.
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
## 2015-01-16 ENCOUNTER — Inpatient Hospital Stay (HOSPITAL_COMMUNITY): Payer: Medicare Other

## 2015-01-16 ENCOUNTER — Other Ambulatory Visit: Payer: Medicare Other

## 2015-01-16 ENCOUNTER — Encounter (HOSPITAL_COMMUNITY): Payer: Self-pay

## 2015-01-16 DIAGNOSIS — J869 Pyothorax without fistula: Secondary | ICD-10-CM | POA: Insufficient documentation

## 2015-01-16 DIAGNOSIS — J9383 Other pneumothorax: Secondary | ICD-10-CM

## 2015-01-16 LAB — APTT: APTT: 31 s (ref 24–37)

## 2015-01-16 LAB — PROCALCITONIN

## 2015-01-16 LAB — GLUCOSE, CAPILLARY
Glucose-Capillary: 112 mg/dL — ABNORMAL HIGH (ref 65–99)
Glucose-Capillary: 93 mg/dL (ref 65–99)

## 2015-01-16 LAB — PROTIME-INR
INR: 1.16 (ref 0.00–1.49)
Prothrombin Time: 14.9 seconds (ref 11.6–15.2)

## 2015-01-16 LAB — MRSA PCR SCREENING: MRSA by PCR: NEGATIVE

## 2015-01-16 MED ORDER — GLIMEPIRIDE 2 MG PO TABS
2.0000 mg | ORAL_TABLET | Freq: Every day | ORAL | Status: DC
  Administered 2015-01-16 – 2015-01-20 (×5): 2 mg via ORAL
  Filled 2015-01-16 (×6): qty 1

## 2015-01-16 MED ORDER — OXYCODONE HCL 5 MG PO TABS
10.0000 mg | ORAL_TABLET | ORAL | Status: DC | PRN
  Administered 2015-01-16 – 2015-01-17 (×9): 10 mg via ORAL
  Filled 2015-01-16 (×9): qty 2

## 2015-01-16 MED ORDER — IOHEXOL 300 MG/ML  SOLN
75.0000 mL | Freq: Once | INTRAMUSCULAR | Status: AC | PRN
  Administered 2015-01-16: 75 mL via INTRAVENOUS

## 2015-01-16 MED ORDER — MIDAZOLAM HCL 2 MG/2ML IJ SOLN
INTRAMUSCULAR | Status: AC
  Filled 2015-01-16: qty 2

## 2015-01-16 MED ORDER — PANTOPRAZOLE SODIUM 40 MG IV SOLR
40.0000 mg | INTRAVENOUS | Status: DC
  Administered 2015-01-16 – 2015-01-20 (×5): 40 mg via INTRAVENOUS
  Filled 2015-01-16 (×6): qty 40

## 2015-01-16 MED ORDER — MOMETASONE FURO-FORMOTEROL FUM 100-5 MCG/ACT IN AERO
2.0000 | INHALATION_SPRAY | Freq: Two times a day (BID) | RESPIRATORY_TRACT | Status: DC
  Administered 2015-01-16 – 2015-01-20 (×8): 2 via RESPIRATORY_TRACT
  Filled 2015-01-16: qty 8.8

## 2015-01-16 MED ORDER — FENTANYL CITRATE (PF) 100 MCG/2ML IJ SOLN
INTRAMUSCULAR | Status: AC
  Administered 2015-01-17: 12.5 ug via INTRAVENOUS
  Filled 2015-01-16: qty 2

## 2015-01-16 MED ORDER — LOSARTAN POTASSIUM 50 MG PO TABS: 50.0000 mg | ORAL_TABLET | Freq: Every day | ORAL | Status: DC

## 2015-01-16 MED ORDER — CETYLPYRIDINIUM CHLORIDE 0.05 % MT LIQD
7.0000 mL | Freq: Two times a day (BID) | OROMUCOSAL | Status: DC
  Administered 2015-01-16 – 2015-01-17 (×3): 7 mL via OROMUCOSAL

## 2015-01-16 MED ORDER — PRAVASTATIN SODIUM 20 MG PO TABS
20.0000 mg | ORAL_TABLET | Freq: Every day | ORAL | Status: DC
  Administered 2015-01-16 – 2015-01-19 (×4): 20 mg via ORAL
  Filled 2015-01-16 (×5): qty 1

## 2015-01-16 MED ORDER — SODIUM CHLORIDE 0.9 % IV SOLN
INTRAVENOUS | Status: DC
  Administered 2015-01-16 – 2015-01-17 (×3): via INTRAVENOUS

## 2015-01-16 MED ORDER — MORPHINE SULFATE 4 MG/ML IJ SOLN
4.0000 mg | Freq: Four times a day (QID) | INTRAMUSCULAR | Status: DC | PRN
  Administered 2015-01-16 – 2015-01-17 (×5): 4 mg via INTRAVENOUS
  Filled 2015-01-16 (×5): qty 1

## 2015-01-16 MED ORDER — ENOXAPARIN SODIUM 40 MG/0.4ML ~~LOC~~ SOLN
40.0000 mg | SUBCUTANEOUS | Status: DC
  Administered 2015-01-17 – 2015-01-19 (×3): 40 mg via SUBCUTANEOUS
  Filled 2015-01-16 (×5): qty 0.4

## 2015-01-16 MED ORDER — FENTANYL CITRATE (PF) 100 MCG/2ML IJ SOLN
INTRAMUSCULAR | Status: AC | PRN
  Administered 2015-01-16 (×2): 50 ug via INTRAVENOUS

## 2015-01-16 MED ORDER — MIDAZOLAM HCL 2 MG/2ML IJ SOLN
INTRAMUSCULAR | Status: AC | PRN
  Administered 2015-01-16 (×2): 1 mg via INTRAVENOUS

## 2015-01-16 MED ORDER — LIDOCAINE-EPINEPHRINE 1 %-1:100000 IJ SOLN
INTRAMUSCULAR | Status: AC
  Filled 2015-01-16: qty 1

## 2015-01-16 MED ORDER — FENTANYL 25 MCG/HR TD PT72
25.0000 ug | MEDICATED_PATCH | TRANSDERMAL | Status: DC
  Administered 2015-01-16: 25 ug via TRANSDERMAL
  Filled 2015-01-16: qty 1

## 2015-01-16 MED ORDER — ALBUTEROL SULFATE (2.5 MG/3ML) 0.083% IN NEBU: 2.5000 mg | INHALATION_SOLUTION | Freq: Four times a day (QID) | RESPIRATORY_TRACT | Status: DC | PRN

## 2015-01-16 NOTE — Progress Notes (Signed)
Initial Nutrition Assessment  DOCUMENTATION CODES:  Obesity unspecified  INTERVENTION:   (No nutrition intervention -- pt declined)  NUTRITION DIAGNOSIS:  Increased nutrient needs related to catabolic illness as evidenced by estimated needs  GOAL:  Patient will meet greater than or equal to 90% of their needs  MONITOR:  PO intake, Labs, Weight trends, I & O's  REASON FOR ASSESSMENT:  Malnutrition Screening Tool  ASSESSMENT: 69 yo Male s/p right upper lobe VATS 11/29/14 for squamous cell cancer per Dr. Servando Snare, complicated by air leak that required home chest tube. He had one cycle of Taxol/carboplatin 01/04/15 and presents to [facility] ED 7/5 with chief complaint of SOB, fever 101.5, sweats and chills. Classified as sepsis with lactic acid 0.90, sbp 116, sats 93% on room air. He has moderate obstructive lung disease by recent PFT's with normal diffusion. He continues to smoke despite dx of lung cancer. He is chronic narcotics for back pain for which he has been disabled for 8 years. Known hypertension on cozaar(we will dc for now with BP dropping). Currently on vancomycin and zosyn for suspected post operative site infection.   Patient reports his appetite is "ok".  No % PO intake records available.  Pt endorses weight loss since his last hospitalization but unable to quantify exact amount.  Per wt readings, pt has had a 6% weight loss since May 2016 -- not significant for time frame.  RD offered oral nutrition supplements, however, pt declined.  Nutrition focused physical exam completed.  No muscle or subcutaneous fat depletion noticed.  Height:  Ht Readings from Last 1 Encounters:  01/15/15 '5\' 5"'$  (1.651 m)    Weight:  Wt Readings from Last 1 Encounters:  01/15/15 182 lb (82.555 kg)    Ideal Body Weight:  61.8 kg  Wt Readings from Last 10 Encounters:  01/15/15 182 lb (82.555 kg)  01/09/15 182 lb 1.6 oz (82.6 kg)  01/05/15 190 lb (86.183 kg)  12/28/14 190 lb 4.8 oz  (86.32 kg)  12/21/14 198 lb (89.812 kg)  12/03/14 187 lb 6.3 oz (85 kg)  11/24/14 200 lb 8 oz (90.946 kg)  11/23/14 194 lb (87.998 kg)  11/17/14 194 lb (87.998 kg)  11/15/14 198 lb (89.812 kg)    BMI:  Body mass index is 30.29 kg/(m^2).  Estimated Nutritional Needs:  Kcal:  2000-2200  Protein:  100-110 gm  Fluid:  2.0-2.2 L  Skin:  Reviewed, no issues  Diet Order:  Diet Heart Room service appropriate?: Yes; Fluid consistency:: Thin  EDUCATION NEEDS:  No education needs identified at this time   Intake/Output Summary (Last 24 hours) at 01/16/15 1521 Last data filed at 01/16/15 1426  Gross per 24 hour  Intake 3018.75 ml  Output   1425 ml  Net 1593.75 ml    Last BM:  7/4  Arthur Holms, RD, LDN Pager #: [phone] After-Hours Pager #: [phone]

## 2015-01-16 NOTE — ED Notes (Signed)
Attempted to call report. Primary nurse unable to take report and will call back when available.
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
## 2015-01-16 NOTE — Progress Notes (Addendum)
[address]       [name] Day,[name] Day [id number]             [phone]            Subjective: Patient with a lot of pain.  Objective: Vital signs in last 24 hours: Temp:  [97.5 F (36.4 C)-101.7 F (38.7 C)] 98.3 F (36.8 C) (07/05 0712) Pulse Rate:  [70-110] 70 (07/05 0411) Cardiac Rhythm:  [-]  Resp:  [15-28] 25 (07/05 0411) BP: (103-137)/(59-82) 116/60 mmHg (07/05 0411) SpO2:  [93 %-100 %] 95 % (07/05 0411) Weight:  [182 lb (82.555 kg)] 182 lb (82.555 kg) (07/04 2208)      Intake/Output from previous day: 07/04 0701 - 07/05 0700 In: 2243.8 [P.O.:240; I.V.:1953.8; IV Piggyback:50] Out: 900 [Urine:900]   Physical Exam:  Cardiovascular: RRR Pulmonary: Coarse breath sounds on the right Abdomen: Soft, non tender, bowel sounds present. Wounds: Well healed.  No erythema or signs of infection.   Lab Results: CBC: Recent Labs  01/15/15 2049  WBC 15.8*  HGB 12.6*  HCT 38.3*  PLT 259   BMET:  Recent Labs  01/15/15 2049  NA 135  K 3.6  CL 98*  CO2 27  GLUCOSE 122*  BUN 10  CREATININE 0.73  CALCIUM 9.4    PT/INR: No results for input(s): LABPROT, INR in the last 72 hours. ABG:  INR: Will add last result for INR, ABG once components are confirmed Will add last 4 CBG results once components are confirmed  Assessment/Plan:  1. CV - SR in the 70's. 2.  Pulmonary - CT scan showed moderate right loculated, hydropneumothorax, new subcarinal and right hilar adenopathy (could be reactive), and stable 9 mm nodule in LUL. 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 IR to place drain today 3. Dr. Servando Day has ordered a Fentanyl patch to lessen pain  [name] Day,[name] Day 01/16/2015,8:46 AM   Ct Image Guided Fluid Drain By Catheter  01/16/2015   INDICATION: History of empyema involving the right upper lobe, post VATS lobectomy and failed right-sided chest tube placement, now with recurrent enlarging symptomatic right-sided hydro  pneumothorax. Request made for placement of a CT-guided chest tube.  EXAM: CT IMAGE GUIDED FLUID DRAIN BY CATHETER  COMPARISON:  Chest CT- 02/05/2015 ; chest radiograph- 01/15/2015; 12/13/2014; 11/28/2024  MEDICATIONS: The patient is currently admitted to the hospital and receiving intravenous antibiotics. The antibiotics were administered within an appropriate time frame prior to the initiation of the procedure.  ANESTHESIA/SEDATION: Fentanyl 100 mcg IV; Versed 2 mg IV  Total Moderate Sedation time  20 minutes  CONTRAST:  None  COMPLICATIONS: None immediate  PROCEDURE: Informed written consent was obtained from the patient after a discussion of the risks, benefits and alternatives to treatment. The patient was placed right lateral decubitus on the CT gantry and a pre procedural CT was performed re-demonstrating the known loculated right-sided hydro pneumothorax. The procedure was planned. A timeout was performed prior to the initiation of the procedure.  The right posterior lower back was prepped and draped in the usual sterile fashion. The overlying soft tissues were anesthetized with 1% lidocaine with epinephrine. Appropriate trajectory was planned with the use of a 22 gauge spinal needle. An 18 gauge trocar needle was advanced into the abscess/fluid collection and a short Amplatz super stiff wire was coiled within the collection. Appropriate positioning was confirmed with a limited CT scan. The tract was serially dilated allowing placement of a 12 [place]  all-purpose drainage catheter. Appropriate positioning was confirmed with a limited postprocedural CT scan.  Approximately 30 cc of rust colored fluid was aspirated. The tube was connected to a Pleur-Evac and sutured in place. A dressing was placed. The patient tolerated the procedure well without immediate post procedural complication.  IMPRESSION: Successful CT guided placement of a 69 French all purpose drain catheter into the posterior right pleural space  with aspiration of 30 cc of rust colored fluid. Samples were sent to the laboratory as requested by the ordering clinical team.   Electronically Signed   By: [name] Day M.D.   On: 01/16/2015 17:01   I have seen and examined [name] Day and agree with the above assessment  and plan.  [name] Isaac MD Beeper [phone] Office [phone] 01/16/2015 6:50 PM

## 2015-01-16 NOTE — Progress Notes (Signed)
UR COMPLETED  

## 2015-01-16 NOTE — Procedures (Signed)
Technically successful CT guided drainage catheter placement into right pleural space yielding 30 cc of rust colored pleural fluid.  Samples sent to laboratory.  No immediate post procedural complications.

## 2015-01-16 NOTE — ED Notes (Signed)
Gave report to [facility].  
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
## 2015-01-16 NOTE — Progress Notes (Signed)
Called Di Kindle RN and received report.
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
## 2015-01-17 ENCOUNTER — Inpatient Hospital Stay (HOSPITAL_COMMUNITY): Payer: Medicare Other

## 2015-01-17 ENCOUNTER — Encounter: Payer: Self-pay | Admitting: Skilled Nursing Facility1

## 2015-01-17 LAB — VANCOMYCIN, TROUGH
Vancomycin Tr: 35 ug/mL (ref 10.0–20.0)
Vancomycin Tr: 10 ug/mL (ref 10.0–20.0)

## 2015-01-17 LAB — GLUCOSE, CAPILLARY: Glucose-Capillary: 183 mg/dL — ABNORMAL HIGH (ref 65–99)

## 2015-01-17 MED ORDER — FENTANYL CITRATE (PF) 100 MCG/2ML IJ SOLN
INTRAMUSCULAR | Status: AC
  Filled 2015-01-17: qty 2

## 2015-01-17 MED ORDER — KETOROLAC TROMETHAMINE 30 MG/ML IJ SOLN
30.0000 mg | Freq: Once | INTRAMUSCULAR | Status: AC
  Administered 2015-01-17: 30 mg via INTRAVENOUS

## 2015-01-17 MED ORDER — MORPHINE SULFATE ER 60 MG PO TBCR
60.0000 mg | EXTENDED_RELEASE_TABLET | Freq: Two times a day (BID) | ORAL | Status: DC
  Administered 2015-01-17 – 2015-01-20 (×7): 60 mg via ORAL
  Filled 2015-01-17 (×7): qty 1

## 2015-01-17 MED ORDER — VANCOMYCIN HCL 10 G IV SOLR
1500.0000 mg | Freq: Two times a day (BID) | INTRAVENOUS | Status: DC
  Administered 2015-01-18 – 2015-01-19 (×3): 1500 mg via INTRAVENOUS
  Filled 2015-01-17 (×5): qty 1500

## 2015-01-17 MED ORDER — OXYCODONE-ACETAMINOPHEN 5-325 MG PO TABS
2.0000 | ORAL_TABLET | ORAL | Status: DC | PRN
  Administered 2015-01-17 – 2015-01-18 (×4): 2 via ORAL
  Administered 2015-01-18: 1 via ORAL
  Administered 2015-01-18 – 2015-01-20 (×10): 2 via ORAL
  Filled 2015-01-17 (×16): qty 2

## 2015-01-17 MED ORDER — FENTANYL 25 MCG/HR TD PT72: 50.0000 ug | MEDICATED_PATCH | TRANSDERMAL | Status: DC

## 2015-01-17 MED ORDER — MORPHINE SULFATE ER 15 MG PO TBCR
30.0000 mg | EXTENDED_RELEASE_TABLET | Freq: Two times a day (BID) | ORAL | Status: DC
  Administered 2015-01-17 – 2015-01-20 (×7): 30 mg via ORAL
  Filled 2015-01-17 (×7): qty 2

## 2015-01-17 MED ORDER — KETOROLAC TROMETHAMINE 30 MG/ML IJ SOLN
INTRAMUSCULAR | Status: AC
  Filled 2015-01-17: qty 1

## 2015-01-17 MED ORDER — FENTANYL CITRATE (PF) 100 MCG/2ML IJ SOLN
12.5000 ug | INTRAMUSCULAR | Status: AC | PRN
  Administered 2015-01-17 (×5): 12.5 ug via INTRAVENOUS
  Filled 2015-01-17 (×4): qty 2

## 2015-01-17 NOTE — Progress Notes (Signed)
ANTIBIOTIC CONSULT NOTE - FOLLOW UP  Pharmacy Consult for Vancomycin Indication: HCAP  No Known Allergies  Patient Measurements: Height: '5\' 5"'$  (165.1 cm) Weight: 182 lb (82.555 kg) IBW/kg (Calculated) : 61.5  Vital Signs: Temp: 99.5 F (37.5 C) (07/06 0751) Temp Source: Oral (07/06 0751) BP: 132/76 mmHg (07/06 0751) Pulse Rate: 89 (07/06 0751) Intake/Output from previous day: 07/05 0701 - 07/06 0700 In: 1760 [P.O.:960; I.V.:450; IV Piggyback:350] Out: 2542 [Urine:2950; Chest Tube:96] Intake/Output from this shift: Total I/O In: 1905 [P.O.:480; I.V.:1425] Out: 150 [Urine:150]  Labs:  Recent Labs  01/15/15 2049  WBC 15.8*  HGB 12.6*  PLT 259  CREATININE 0.73   Estimated Creatinine Clearance: 87.4 mL/min (by C-G formula based on Cr of 0.73).  Recent Labs  01/17/15 0934  VANCOTROUGH 35*     Microbiology: Recent Results (from the past 720 hour(s))  Culture, blood (routine x 2)     Status: None (Preliminary result)   Collection Time: 01/15/15  8:45 PM  Result Value Ref Range Status   Specimen Description BLOOD RIGHT HAND  Final   Special Requests BOTTLES DRAWN AEROBIC AND ANAEROBIC 5CC  Final   Culture   Final    NO GROWTH < 24 HOURS Performed at [facility]    Report Status PENDING  Incomplete  Culture, blood (routine x 2)     Status: None (Preliminary result)   Collection Time: 01/15/15  8:53 PM  Result Value Ref Range Status   Specimen Description BLOOD LAC  Final   Special Requests BOTTLES DRAWN AEROBIC AND ANAEROBIC 5ML  Final   Culture   Final    NO GROWTH < 24 HOURS Performed at [facility]    Report Status PENDING  Incomplete  MRSA PCR Screening     Status: None   Collection Time: 01/16/15  2:22 AM  Result Value Ref Range Status   MRSA by PCR NEGATIVE NEGATIVE Final    Comment:        The GeneXpert MRSA Assay (FDA approved for NASAL specimens only), is one component of a comprehensive MRSA colonization surveillance program.  It is not intended to diagnose MRSA infection nor to guide or monitor treatment for MRSA infections.   Culture, routine-abscess     Status: None (Preliminary result)   Collection Time: 01/16/15  4:11 PM  Result Value Ref Range Status   Specimen Description ABSCESS CHEST DRAINAGE  Final   Special Requests Normal  Final   Gram Stain   Final    RARE WBC PRESENT, PREDOMINANTLY PMN NO SQUAMOUS EPITHELIAL CELLS SEEN NO ORGANISMS SEEN Performed at [facility]    Culture PENDING  Incomplete   Report Status PENDING  Incomplete    Anti-infectives    Start     Dose/Rate Route Frequency Ordered Stop   01/16/15 0600  piperacillin-tazobactam (ZOSYN) IVPB 3.375 g     3.375 g 12.5 mL/hr over 240 Minutes Intravenous Every 8 hours 01/15/15 2218     01/15/15 2215  piperacillin-tazobactam (ZOSYN) IVPB 3.375 g     3.375 g 100 mL/hr over 30 Minutes Intravenous  Once 01/15/15 2208 01/15/15 2249   01/15/15 2215  vancomycin (VANCOCIN) IVPB 1000 mg/200 mL premix     1,000 mg 200 mL/hr over 60 Minutes Intravenous 2 times daily 01/15/15 2211        Assessment: [name] Day is an 69 y.o. male admitted on 01/15/2015 presenting from [place] with SOB, fever, sweats and chills. CXR revealed apical posterior  pneumnothorax.  Patient previously had a prolonged postop air leak requiring d/c home with chest tube x 3 weeks.  Pharmacy has been consulted to dose empiric antibiotics Vanc and zosyn for HCAP/empyema.    Patient s/p chest tube/drain placement 7/5 w/ CXR showing near complete resolution of empyema, small pneumothorax.  Patient to continue chest tube drain for now.    7/6 VT 35 (drawn 30 minutes after infusion vancomycin, therefore expected to be higher)  BCx2 7/4 >>  7/4 vanc >> 7/4 pip-tazo >>  Goal of Therapy:  Vancomycin trough level 15-20 mcg/ml  Plan:  - Continue Vancomycin 1000 mg IV BID for now - Recheck VT this evening - Monitor for clinical efficacy, renal function, and  cultures  Hassie Bruce, Pharm. D. Clinical Pharmacy Resident Pager: [phone] Ph: [phone] 01/17/2015 10:57 AM

## 2015-01-17 NOTE — Consult Note (Signed)
PULMONARY / CRITICAL CARE MEDICINE   Name: [name] Day MRN: [id number] DOB: 1946-03-10    ADMISSION DATE:  01/15/2015 CONSULTATION DATE:  01/16/2015  REFERRING MD : CVTS  CHIEF COMPLAINT:  SOB  BRIEF DESCRIPTION: 69 yo male s/p Rt upper lobectomy 12/04/14 for NSCLC (Squamous cell).  He returned to hospital with fever, dyspnea, and chest pain from Rt empyema.  He is followed by Dr. Halford Chessman for COPD.  STUDIES:  7/05 CT chest >> Rt hydropneumothorax  SIGNIFICANT EVENTS: 7/04 Admit 7/05 IR placed chest tube  SUBJECTIVE:  Had trouble sleeping due to chest pain.  VITAL SIGNS: Temp:  [98 F (36.7 C)-99.5 F (37.5 C)] 98.8 F (37.1 C) (07/06 0507) Pulse Rate:  [86-102] 93 (07/05 2000) Resp:  [15-31] 29 (07/05 2000) BP: (101-150)/(58-90) 132/76 mmHg (07/06 0751) SpO2:  [84 %-98 %] 95 % (07/06 0733) INTAKE / OUTPUT:  Intake/Output Summary (Last 24 hours) at 01/17/15 0806 Last data filed at 01/17/15 0700  Gross per 24 hour  Intake   1760 ml  Output   2521 ml  Net   -761 ml    PHYSICAL EXAMINATION: General: anxious Neuro: normal strength HEENT:  Poor dentition Cardiovascular: regular Lungs: no wheeze Abdomen: non tender Musculoskeletal: no edema  , LABS:  CBC  Recent Labs Lab 01/15/15 2049  WBC 15.8*  HGB 12.6*  HCT 38.3*  PLT 259   Coag's  Recent Labs Lab 01/16/15 1303  APTT 31  INR 1.16   BMET  Recent Labs Lab 01/15/15 2049  NA 135  K 3.6  CL 98*  CO2 27  BUN 10  CREATININE 0.73  GLUCOSE 122*   Electrolytes  Recent Labs Lab 01/15/15 2049  CALCIUM 9.4   Sepsis Markers  Recent Labs Lab 01/15/15 2100 01/16/15 1303  LATICACIDVEN 0.90  --   PROCALCITON  --  <0.10   Liver Enzymes  Recent Labs Lab 01/15/15 2049  AST 35  ALT 29  ALKPHOS 110  BILITOT 0.4  ALBUMIN 3.3*   Glucose  Recent Labs Lab 01/16/15 0200 01/16/15 0718  GLUCAP 112* 93    Imaging Dg Chest Port 1 View  01/17/2015   CLINICAL DATA:  Chest tube for  hydropneumothorax on right  EXAM: PORTABLE CHEST - 1 VIEW  COMPARISON:  Chest radiograph January 15, 2015; chest CT January 16, 2015 as well as CT drainage procedure January 16, 2015  FINDINGS: There is a drain in the right hemithorax. The previously noted air-fluid level on the right has virtually completely resolved. There is still a degree of air on the right felt to represent some residual pneumothorax. There is atelectatic change in the right base with volume loss on the right. The left lung is clear. The heart size is normal. Pulmonary vascularity on the left is normal. There is postoperative change on the right causing some distortion of the pulmonary vascularity on the right. No adenopathy is appreciable radiographically.  IMPRESSION: Right chest drain in place with resolution of most of the fluid. There is still a degree of pneumothorax without tension component on the right. There is volume loss on the right with right base atelectatic change. Left lung clear. No change in cardiac silhouette.   Electronically Signed   By: [name] M.D.   On: 01/17/2015 07:37   Ct Image Guided Fluid Drain By Catheter  01/16/2015   INDICATION: History of empyema involving the right upper lobe, post VATS lobectomy and failed right-sided chest tube placement, now with recurrent  enlarging symptomatic right-sided hydro pneumothorax. Request made for placement of a CT-guided chest tube.  EXAM: CT IMAGE GUIDED FLUID DRAIN BY CATHETER  COMPARISON:  Chest CT- 02/05/2015 ; chest radiograph- 01/15/2015; 12/13/2014; 11/28/2024  MEDICATIONS: The patient is currently admitted to the hospital and receiving intravenous antibiotics. The antibiotics were administered within an appropriate time frame prior to the initiation of the procedure.  ANESTHESIA/SEDATION: Fentanyl 100 mcg IV; Versed 2 mg IV  Total Moderate Sedation time  20 minutes  CONTRAST:  None  COMPLICATIONS: None immediate  PROCEDURE: Informed written consent was obtained  from the patient after a discussion of the risks, benefits and alternatives to treatment. The patient was placed right lateral decubitus on the CT gantry and a pre procedural CT was performed re-demonstrating the known loculated right-sided hydro pneumothorax. The procedure was planned. A timeout was performed prior to the initiation of the procedure.  The right posterior lower back was prepped and draped in the usual sterile fashion. The overlying soft tissues were anesthetized with 1% lidocaine with epinephrine. Appropriate trajectory was planned with the use of a 22 gauge spinal needle. An 18 gauge trocar needle was advanced into the abscess/fluid collection and a short Amplatz super stiff wire was coiled within the collection. Appropriate positioning was confirmed with a limited CT scan. The tract was serially dilated allowing placement of a 12 [place] all-purpose drainage catheter. Appropriate positioning was confirmed with a limited postprocedural CT scan.  Approximately 30 cc of rust colored fluid was aspirated. The tube was connected to a Pleur-Evac and sutured in place. A dressing was placed. The patient tolerated the procedure well without immediate post procedural complication.  IMPRESSION: Successful CT guided placement of a 37 French all purpose drain catheter into the posterior right pleural space with aspiration of 30 cc of rust colored fluid. Samples were sent to the laboratory as requested by the ordering clinical team.   Electronically Signed   By: Sandi Mariscal M.D.   On: 01/16/2015 17:01     ASSESSMENT / PLAN: Blood cx 7/04 >> Pleural fluid cx 7/05 >>  Rt empyema. Plan: - chest tube per TCTS and IR - f/u CXR - day 3 vancomycin, zosyn  COPD. 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 Plan: - monitor blood counts  Pain control. Plan: - per primary team  Chesley Mires, MD [place] 01/17/2015, 8:11 AM Pager:   [phone] After 3pm call: [phone]   Chesley Mires, MD [place] 01/17/2015, 8:06 AM Pager:  [phone] After 3pm call: [phone]

## 2015-01-17 NOTE — Progress Notes (Signed)
Patient ID: [name] Day, male   DOB: 17-Oct-1945, 69 y.o.   MRN: [id number]     Referring Physician(s):  Dr. Prescott Gum  Subjective:  [name] Day is very grumpy this morning, swearing, c/o getting no sleep and that the chest tube is "bothering him".  He states "this lung just keeps giving me a fit".   Allergies: Review of patient's allergies indicates no known allergies.  Medications: Prior to Admission medications   Medication Sig Start Date End Date Taking? Authorizing Provider  albuterol (PROVENTIL HFA;VENTOLIN HFA) 108 (90 BASE) MCG/ACT inhaler Inhale 2 puffs into the lungs every 4 (four) hours as needed for wheezing. For wheezing   Yes Historical Provider, MD  aspirin EC 81 MG tablet Take 1 tablet (81 mg total) by mouth daily. 11/17/14  Yes [name], MD  Cholecalciferol 4000 UNITS CAPS Take 1 capsule by mouth daily.   Yes Historical Provider, MD  Fluticasone-Salmeterol (ADVAIR) 100-50 MCG/DOSE AEPB Inhale 1 puff into the lungs 2 (two) times daily.   Yes Historical Provider, MD  glimepiride (AMARYL) 2 MG tablet Take 2 mg by mouth daily with breakfast.   Yes Historical Provider, MD  losartan-hydrochlorothiazide (HYZAAR) 100-12.5 MG per tablet Take 1 tablet by mouth daily.   Yes Historical Provider, MD  oxyCODONE-acetaminophen (PERCOCET) 10-325 MG per tablet Take 2 tablets by mouth every 6 (six) hours as needed for pain. 12/15/14  Yes [name], PA-C  oxymorphone (OPANA ER) 30 MG 12 hr tablet Take 30 mg by mouth every 12 (twelve) hours.   Yes Historical Provider, MD  pravastatin (PRAVACHOL) 20 MG tablet Take 20 mg by mouth daily.    Yes Historical Provider, MD  prochlorperazine (COMPAZINE) 10 MG tablet Take 1 tablet (10 mg total) by mouth every 6 (six) hours as needed for nausea or vomiting. 12/28/14  Yes [name], MD     Vital Signs: BP 132/76 mmHg  Pulse 89  Temp(Src) 99.5 F (37.5 C) (Oral)  Resp 16  Ht '5\' 5"'$  (1.651 m)  Wt 182 lb (82.555 kg)  BMI 30.29 kg/m2   SpO2 96%  Physical Exam   Awake and Alert Sitting on the side of the bed. Chest tube intact, to suction. No air leak ~100 mls output, purulent in appearance. Lungs are clear. Tube entry site looks good, no erythema.  Imaging: Dg Chest 2 View  01/15/2015   CLINICAL DATA:  Sudden onset of shortness of breath with fever and productive cough. Patient has lung cancer and is undergoing chemotherapy treatments.  EXAM: CHEST  2 VIEW  COMPARISON:  January 05 2015, June 9th 2016, Dec 08, 2014  FINDINGS: The heart size and mediastinal contours are stable. There is right hydro pneumothorax increased compared to prior exam. There is a right pleural effusion. There is opacity of the right hilar and infrahilar region unchanged. The left lung is clear. The visualized skeletal structures are stable.  IMPRESSION: Right hydro pneumothorax increased compared to prior exam. There is a small right pleural effusion. Consolidation of the right hilar and infrahilar region unchanged compared to prior exam.   Electronically Signed   By: Abelardo Diesel M.D.   On: 01/15/2015 21:15   Ct Chest W Contrast  01/16/2015   CLINICAL DATA:  Empyema. 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 Leukocytosis with fever.  EXAM: CT CHEST WITH CONTRAST  TECHNIQUE: Multidetector CT imaging of the chest was performed during intravenous contrast administration.  CONTRAST:  13m  OMNIPAQUE IOHEXOL 300 MG/ML  SOLN  COMPARISON:  09/18/2014  FINDINGS: THORACIC INLET/BODY WALL:  No acute abnormality.  MEDIASTINUM:  No cardiomegaly or pericardial effusion. No acute vascular findings. Interval enlargement of mediastinal and right hilar lymph nodes could be reactive.  LUNG WINDOWS:  There is hydro pneumothorax in the posterior right pleural cavity with fluid level superiorly and scattered bubbles of non flowing gas at the base. Associated pleural thickening and enhancement, especially inferiorly. Volume of pleural  collection is estimated at 30-40%.  There is a band opacity along right upper lobectomy chain sutures which will require surveillance imaging.  Patchy ground-glass opacity in the left lung is new from previous study and likely atelectasis.  Stable 9 mm nodule with cavitation in the left upper lobe along the mediastinum, present on image 14.  UPPER ABDOMEN:  No acute findings.  OSSEOUS:  No acute fracture.  No suspicious lytic or blastic lesions.  IMPRESSION: 1. Moderate right posterior hydropneumothorax. Loculation and pleural thickening is consistent with empyema given the clinical circumstances. 2. New subcarinal and right hilar adenopathy could be reactive, but requires surveillance imaging. 3. Stable 9 mm cavitary nodule in the left upper lobe, suspected neoplasm based on PET-CT April 2016.   Electronically Signed   By: Monte Fantasia M.D.   On: 01/16/2015 05:23   Dg Chest Port 1 View  01/17/2015   CLINICAL DATA:  Chest tube for hydropneumothorax on right  EXAM: PORTABLE CHEST - 1 VIEW  COMPARISON:  Chest radiograph January 15, 2015; chest CT January 16, 2015 as well as CT drainage procedure January 16, 2015  FINDINGS: There is a drain in the right hemithorax. The previously noted air-fluid level on the right has virtually completely resolved. There is still a degree of air on the right felt to represent some residual pneumothorax. There is atelectatic change in the right base with volume loss on the right. The left lung is clear. The heart size is normal. Pulmonary vascularity on the left is normal. There is postoperative change on the right causing some distortion of the pulmonary vascularity on the right. No adenopathy is appreciable radiographically.  IMPRESSION: Right chest drain in place with resolution of most of the fluid. There is still a degree of pneumothorax without tension component on the right. There is volume loss on the right with right base atelectatic change. Left lung clear. No change in cardiac  silhouette.   Electronically Signed   By: [name] M.D.   On: 01/17/2015 07:37   Ct Image Guided Fluid Drain By Catheter  01/16/2015   INDICATION: History of empyema involving the right upper lobe, post VATS lobectomy and failed right-sided chest tube placement, now with recurrent enlarging symptomatic right-sided hydro pneumothorax. Request made for placement of a CT-guided chest tube.  EXAM: CT IMAGE GUIDED FLUID DRAIN BY CATHETER  COMPARISON:  Chest CT- 02/05/2015 ; chest radiograph- 01/15/2015; 12/13/2014; 11/28/2024  MEDICATIONS: The patient is currently admitted to the hospital and receiving intravenous antibiotics. The antibiotics were administered within an appropriate time frame prior to the initiation of the procedure.  ANESTHESIA/SEDATION: Fentanyl 100 mcg IV; Versed 2 mg IV  Total Moderate Sedation time  20 minutes  CONTRAST:  None  COMPLICATIONS: None immediate  PROCEDURE: Informed written consent was obtained from the patient after a discussion of the risks, benefits and alternatives to treatment. The patient was placed right lateral decubitus on the CT gantry and a pre procedural CT was performed re-demonstrating the known loculated right-sided  hydro pneumothorax. The procedure was planned. A timeout was performed prior to the initiation of the procedure.  The right posterior lower back was prepped and draped in the usual sterile fashion. The overlying soft tissues were anesthetized with 1% lidocaine with epinephrine. Appropriate trajectory was planned with the use of a 22 gauge spinal needle. An 18 gauge trocar needle was advanced into the abscess/fluid collection and a short Amplatz super stiff wire was coiled within the collection. Appropriate positioning was confirmed with a limited CT scan. The tract was serially dilated allowing placement of a 12 [place] all-purpose drainage catheter. Appropriate positioning was confirmed with a limited postprocedural CT scan.  Approximately 30 cc  of rust colored fluid was aspirated. The tube was connected to a Pleur-Evac and sutured in place. A dressing was placed. The patient tolerated the procedure well without immediate post procedural complication.  IMPRESSION: Successful CT guided placement of a 46 French all purpose drain catheter into the posterior right pleural space with aspiration of 30 cc of rust colored fluid. Samples were sent to the laboratory as requested by the ordering clinical team.   Electronically Signed   By: Sandi Mariscal M.D.   On: 01/16/2015 17:01    Labs:  CBC:  Recent Labs  12/28/14 1423 01/02/15 0901 01/09/15 1120 01/15/15 2049  WBC 11.6* 8.5 25.1* 15.8*  HGB 13.5 14.1 14.2 12.6*  HCT 41.9 43.2 42.4 38.3*  PLT 320 361 299 259    COAGS:  Recent Labs  11/07/14 0757 11/24/14 1352 01/16/15 1303  INR 0.98 1.01 1.16  APTT '29 31 31    '$ BMP:  Recent Labs  12/07/14 0100 12/08/14 0915 12/13/14 0258 12/28/14 1424 01/02/15 0901 01/09/15 1120 01/15/15 2049  NA 134* 131* 131* 141 139 135* 135  K 4.1 4.0 4.0 4.5 4.5 4.8 3.6  CL 93* 91* 91*  --   --   --  98*  CO2 '30 28 29 29 28 29 27  '$ GLUCOSE 123* 158* 120* 153* 115 147* 122*  BUN 19 17 21* 12.4 17.1 11.5 10  CALCIUM 9.5 9.3 9.0 10.4 10.4 10.1 9.4  CREATININE 0.92 0.86 0.96 1.0 0.9 0.9 0.73  GFRNONAA >60 >60 >60  --   --   --  >60  GFRAA >60 >60 >60  --   --   --  >60    LIVER FUNCTION TESTS:  Recent Labs  12/28/14 1424 01/02/15 0901 01/09/15 1120 01/15/15 2049  BILITOT 0.41 0.53 0.78 0.4  AST '21 23 17 '$ 35  ALT '20 24 15 29  '$ ALKPHOS 86 95 157* 110  PROT 7.8 8.0 7.7 7.7  ALBUMIN 3.3* 3.5 3.8 3.3*    Assessment and Plan:  S/P Chest tube/drain placement on 7/5 for empyema. CXR with near complete resolution of empyema, still small PTX Continue CT to suction for now.   Signed: Murrell Redden PA-C 01/17/2015, 9:09 AM   I spent a total of 15 Minutes in face to face in clinical consultation/evaluation, greater than 50% of which was  counseling/coordinating care for chest tube/drain.

## 2015-01-17 NOTE — Care Management (Signed)
Important Message  Patient Details  Name: [name] Day MRN: [id number] Date of Birth: [birth year]   Medicare Important Message Given:  Yes-second notification given    Delorse Lek 01/17/2015, 9:59 AM

## 2015-01-17 NOTE — Progress Notes (Addendum)
[address]       [place] [address]             [phone]      Subjective:  [name] Day complains of severe pain.  He states the chest tube is causing a lot of discomfort and he has been unable to get any rest.  Objective: Vital signs in last 24 hours: Temp:  [98 F (36.7 C)-99.5 F (37.5 C)] 98.8 F (37.1 C) (07/06 0507) Pulse Rate:  [86-102] 93 (07/05 2000) Cardiac Rhythm:  [-] Normal sinus rhythm (07/05 2350) Resp:  [15-31] 29 (07/05 2000) BP: (101-150)/(58-90) 132/76 mmHg (07/06 0751) SpO2:  [84 %-98 %] 95 % (07/06 0733)  Intake/Output from previous day: 07/05 0701 - 07/06 0700 In: 1760 [P.O.:960; I.V.:450; IV Piggyback:350] Out: 5397 [Urine:2950; Chest Tube:96]  General appearance: alert, cooperative and mild distress Heart: regular rate and rhythm Lungs: diminished breath sounds right base Abdomen: soft, non-tender; bowel sounds normal; no masses,  no organomegaly Wound: clean and dry  Lab Results:  Recent Labs  01/15/15 2049  WBC 15.8*  HGB 12.6*  HCT 38.3*  PLT 259   BMET:  Recent Labs  01/15/15 2049  NA 135  K 3.6  CL 98*  CO2 27  GLUCOSE 122*  BUN 10  CREATININE 0.73  CALCIUM 9.4    PT/INR:  Recent Labs  01/16/15 1303  LABPROT 14.9  INR 1.16   ABG    Component Value Date/Time   PHART TEST WILL BE CREDITED 11/30/2014 0500   HCO3 TEST WILL BE CREDITED 11/30/2014 0500   TCO2 TEST WILL BE CREDITED 11/30/2014 0500   ACIDBASEDEF TEST WILL BE CREDITED 11/30/2014 0500   O2SAT TEST WILL BE CREDITED 11/30/2014 0500   CBG (last 3)   Recent Labs  01/16/15 0200 01/16/15 0718  GLUCAP 112* 93    Assessment/Plan:  1. Chest tube- minimal output last 24 hours, no air leak present, CXR shows resolution of effusion, but there is a residual pneumothorax present... Possibly transition tube to water seal 2. Pulm-wean oxygen as tolerated, encouraged use of IS 3. Pain control- patient has chronic pain, medications have  been changed to his home medications, will increase Fentanyl patch to 50 mg, give 1 dose of Toradol, and Fentanyl IV prn for 5 doses 4. Dispo- patient's pain is biggest issue currently, adjustments to medication regimen made, repeat CXR in AM   LOS: 2 days    BARRETT, ERIN 01/17/2015  Follow up labs in am Recent Results (from the past 240 hour(s))  Culture, blood (routine x 2)     Status: None (Preliminary result)   Collection Time: 01/15/15  8:45 PM  Result Value Ref Range Status   Specimen Description BLOOD RIGHT HAND  Final   Special Requests BOTTLES DRAWN AEROBIC AND ANAEROBIC 5CC  Final   Culture   Final    NO GROWTH < 24 HOURS Performed at [facility]    Report Status PENDING  Incomplete  Culture, blood (routine x 2)     Status: None (Preliminary result)   Collection Time: 01/15/15  8:53 PM  Result Value Ref Range Status   Specimen Description BLOOD LAC  Final   Special Requests BOTTLES DRAWN AEROBIC AND ANAEROBIC 5ML  Final   Culture   Final    NO GROWTH < 24 HOURS Performed at [facility]    Report Status PENDING  Incomplete  MRSA PCR Screening  Status: None   Collection Time: 01/16/15  2:22 AM  Result Value Ref Range Status   MRSA by PCR NEGATIVE NEGATIVE Final    Comment:        The GeneXpert MRSA Assay (FDA approved for NASAL specimens only), is one component of a comprehensive MRSA colonization surveillance program. It is not intended to diagnose MRSA infection nor to guide or monitor treatment for MRSA infections.   Culture, routine-abscess     Status: None (Preliminary result)   Collection Time: 01/16/15  4:11 PM  Result Value Ref Range Status   Specimen Description ABSCESS CHEST DRAINAGE  Final   Special Requests Normal  Final   Gram Stain   Final    RARE WBC PRESENT, PREDOMINANTLY PMN NO SQUAMOUS EPITHELIAL CELLS SEEN NO ORGANISMS SEEN Performed at [facility]    Culture PENDING  Incomplete   Report Status PENDING   Incomplete   So far cultures negative and minimal clear drainage from chest tube, chest xray improved  Chronic back pain issues still present as was prop  Needs to ambulate I have seen and examined [name] Day and agree with the above assessment  and plan.  Grace Isaac MD Beeper [phone] Office [phone] 01/17/2015 11:22 AM

## 2015-01-17 NOTE — Progress Notes (Signed)
ANTIBIOTIC CONSULT NOTE - FOLLOW UP  Pharmacy Consult for Vancomycin Indication: HCAP  No Known Allergies  Patient Measurements: Height: '5\' 5"'$  (165.1 cm) Weight: 182 lb (82.555 kg) IBW/kg (Calculated) : 61.5  Vital Signs: Temp: 99 F (37.2 C) (07/06 2054) Temp Source: Oral (07/06 2054) BP: 142/73 mmHg (07/06 2054) Pulse Rate: 86 (07/06 2054) Intake/Output from previous day: 07/05 0701 - 07/06 0700 In: 1760 [P.O.:960; I.V.:450; IV Piggyback:350] Out: 0923 [Urine:2950; Chest Tube:96] Intake/Output from this shift:    Labs:  Recent Labs  01/15/15 2049  WBC 15.8*  HGB 12.6*  PLT 259  CREATININE 0.73   Estimated Creatinine Clearance: 87.4 mL/min (by C-G formula based on Cr of 0.73).  Recent Labs  01/17/15 0934 01/17/15 2111  VANCOTROUGH 35* 10     Microbiology: Recent Results (from the past 720 hour(s))  Culture, blood (routine x 2)     Status: None (Preliminary result)   Collection Time: 01/15/15  8:45 PM  Result Value Ref Range Status   Specimen Description BLOOD RIGHT HAND  Final   Special Requests BOTTLES DRAWN AEROBIC AND ANAEROBIC 5CC  Final   Culture   Final    NO GROWTH 2 DAYS Performed at [facility]    Report Status PENDING  Incomplete  Culture, blood (routine x 2)     Status: None (Preliminary result)   Collection Time: 01/15/15  8:53 PM  Result Value Ref Range Status   Specimen Description BLOOD LAC  Final   Special Requests BOTTLES DRAWN AEROBIC AND ANAEROBIC 5ML  Final   Culture   Final    NO GROWTH 2 DAYS Performed at [facility]    Report Status PENDING  Incomplete  MRSA PCR Screening     Status: None   Collection Time: 01/16/15  2:22 AM  Result Value Ref Range Status   MRSA by PCR NEGATIVE NEGATIVE Final    Comment:        The GeneXpert MRSA Assay (FDA approved for NASAL specimens only), is one component of a comprehensive MRSA colonization surveillance program. It is not intended to diagnose MRSA infection nor  to guide or monitor treatment for MRSA infections.   Culture, routine-abscess     Status: None (Preliminary result)   Collection Time: 01/16/15  4:11 PM  Result Value Ref Range Status   Specimen Description ABSCESS CHEST DRAINAGE  Final   Special Requests Normal  Final   Gram Stain   Final    RARE WBC PRESENT, PREDOMINANTLY PMN NO SQUAMOUS EPITHELIAL CELLS SEEN NO ORGANISMS SEEN Performed at [facility]    Culture PENDING  Incomplete   Report Status PENDING  Incomplete    Anti-infectives    Start     Dose/Rate Route Frequency Ordered Stop   01/18/15 0600  vancomycin (VANCOCIN) 1,500 mg in sodium chloride 0.9 % 500 mL IVPB     1,500 mg 250 mL/hr over 120 Minutes Intravenous Every 12 hours 01/17/15 2225     01/16/15 0600  piperacillin-tazobactam (ZOSYN) IVPB 3.375 g     3.375 g 12.5 mL/hr over 240 Minutes Intravenous Every 8 hours 01/15/15 2218     01/15/15 2215  piperacillin-tazobactam (ZOSYN) IVPB 3.375 g     3.375 g 100 mL/hr over 30 Minutes Intravenous  Once 01/15/15 2208 01/15/15 2249   01/15/15 2215  vancomycin (VANCOCIN) IVPB 1000 mg/200 mL premix  Status:  Discontinued     1,000 mg 200 mL/hr over 60 Minutes Intravenous 2 times daily 01/15/15  2211 01/17/15 2225      Assessment: [name] Day is an 69 y.o. male admitted on 01/15/2015 from [facility] with SOB, fever, sweats and chills. CXR revealed apical posterior pneumnothorax.  Patient previously had a prolonged postop air leak requiring d/c home with chest tube x 3 weeks.  Pharmacy has been consulted to dose empiric Vancomycin and Zosyn for HCAP/empyema.    Patient s/p chest tube/drain placement 7/5 w/ CXR showing near complete resolution of empyema, small pneumothorax.  Patient to continue chest tube drain for now.    7/6 VT 35 (drawn 30 minutes after infusion vancomycin, therefore expected to be higher)  7/6 PM trough = 10 on Vancomycin 1gm IV q12h regimen  BCx2 7/4 >>  7/4 vanc >> 7/4 pip-tazo >>  Goal of  Therapy:  Vancomycin trough level 15-20 mcg/ml  Plan:  Increase Vancomycin to 1500 mg IV q12h >> estimated trough 15 on this regimen.  Will also schedule next dose a few hours early to help keep trough within therapeutic range.  Continue Zosyn 3.375 gm IV q8h (4 hour infusion).  Follow-up cx data, renal function, and antibiotic plans.  [employer], Pharm.D., BCPS Clinical Pharmacist Pager [phone] 01/17/2015 10:26 PM

## 2015-01-17 NOTE — Progress Notes (Signed)
Subjective:     Patient ID: [name] Day, male   DOB: April 29, 1946, 69 y.o.   MRN: [id number]  HPI   Review of Systems     Objective:   Physical Exam To assist the pt in identifying dietary strategies to gain some lost wt back.    Assessment:     Pt identified as being malnourished due to losing some wt. Pt contacted via the telephone at [phone]. Pt was unavailable.    Plan:     Dietitian left a voicemail for the pt to contact Keller, RD,LDN at his earliest convenience.

## 2015-01-18 ENCOUNTER — Inpatient Hospital Stay (HOSPITAL_COMMUNITY): Payer: Medicare Other

## 2015-01-18 DIAGNOSIS — J432 Centrilobular emphysema: Secondary | ICD-10-CM | POA: Insufficient documentation

## 2015-01-18 DIAGNOSIS — J189 Pneumonia, unspecified organism: Secondary | ICD-10-CM

## 2015-01-18 LAB — CBC
HEMATOCRIT: 33.1 % — AB (ref 39.0–52.0)
Hemoglobin: 10.8 g/dL — ABNORMAL LOW (ref 13.0–17.0)
MCH: 30.6 pg (ref 26.0–34.0)
MCHC: 32.6 g/dL (ref 30.0–36.0)
MCV: 93.8 fL (ref 78.0–100.0)
Platelets: 214 10*3/uL (ref 150–400)
RBC: 3.53 MIL/uL — ABNORMAL LOW (ref 4.22–5.81)
RDW: 13.6 % (ref 11.5–15.5)
WBC: 12.8 10*3/uL — ABNORMAL HIGH (ref 4.0–10.5)

## 2015-01-18 LAB — GLUCOSE, CAPILLARY: Glucose-Capillary: 84 mg/dL (ref 65–99)

## 2015-01-18 LAB — BASIC METABOLIC PANEL
Anion gap: 7 (ref 5–15)
BUN: 11 mg/dL (ref 6–20)
CO2: 29 mmol/L (ref 22–32)
Calcium: 9 mg/dL (ref 8.9–10.3)
Chloride: 101 mmol/L (ref 101–111)
Creatinine, Ser: 0.96 mg/dL (ref 0.61–1.24)
GFR calc Af Amer: >60 mL/min (ref >60)
GFR calc non Af Amer: >60 mL/min (ref >60)
Glucose, Bld: 95 mg/dL (ref 65–99)
Potassium: 5 mmol/L (ref 3.5–5.1)
SODIUM: 137 mmol/L (ref 135–145)

## 2015-01-18 LAB — PROCALCITONIN

## 2015-01-18 MED ORDER — FENTANYL 25 MCG/HR TD PT72
75.0000 ug | MEDICATED_PATCH | TRANSDERMAL | Status: DC
  Administered 2015-01-18: 75 ug via TRANSDERMAL
  Filled 2015-01-18: qty 3

## 2015-01-18 NOTE — Progress Notes (Addendum)
[facility] DAILY ICU PROGRESS NOTE                   [address]            [place] [address]          [phone]        Total Length of Stay:  LOS: 3 days   Subjective: No complaints, very stoic   Objective: Vital signs in last 24 hours: Temp:  [97.9 F (36.6 C)-99.2 F (37.3 C)] 98.1 F (36.7 C) (07/07 0727) Pulse Rate:  [81-91] 82 (07/07 0432) Cardiac Rhythm:  [-] Normal sinus rhythm (07/06 2306) Resp:  [13-24] 13 (07/07 0432) BP: (100-142)/(55-96) 130/68 mmHg (07/07 0727) SpO2:  [92 %-99 %] 92 % (07/07 0732)  Filed Weights   01/15/15 2208  Weight: 182 lb (82.555 kg)    Weight change:    Hemodynamic parameters for last 24 hours:    Intake/Output from previous day: 07/06 0701 - 07/07 0700 In: 3566.3 [P.O.:1280; I.V.:1786.3; IV Piggyback:500] Out: 3094 [Urine:3475]  Intake/Output this shift:    Current Meds: Scheduled Meds: . aspirin EC  81 mg Oral Daily  . docusate sodium  100 mg Oral BID  . enoxaparin (LOVENOX) injection  40 mg Subcutaneous Q24H  . [START ON 01/19/2015] fentaNYL  50 mcg Transdermal Q72H  . glimepiride  2 mg Oral Q breakfast  . mometasone-formoterol  2 puff Inhalation BID  . morphine  30 mg Oral Q12H  . morphine  60 mg Oral Q12H  . multivitamin with minerals  1 tablet Oral Daily  . pantoprazole (PROTONIX) IV  40 mg Intravenous Q24H  . piperacillin-tazobactam (ZOSYN)  IV  3.375 g Intravenous Q8H  . pravastatin  20 mg Oral q1800  . sodium chloride  3 mL Intravenous Q12H  . vancomycin  1,500 mg Intravenous Q12H   Continuous Infusions: . sodium chloride 10 mL/hr at 01/17/15 1152   PRN Meds:.acetaminophen **OR** acetaminophen, albuterol, alum & mag hydroxide-simeth, ondansetron **OR** ondansetron (ZOFRAN) IV, oxyCODONE-acetaminophen, sorbitol  General appearance: alert, cooperative and no distress Heart: regular rate and rhythm Lungs: clear to auscultation bilaterally Abdomen: benign Extremities: warm Wound: incis  healing well  Lab Results: CBC: Recent Labs  01/15/15 2049 01/18/15 0538  WBC 15.8* 12.8*  HGB 12.6* 10.8*  HCT 38.3* 33.1*  PLT 259 214   BMET:  Recent Labs  01/15/15 2049 01/18/15 0538  NA 135 137  K 3.6 5.0  CL 98* 101  CO2 27 29  GLUCOSE 122* 95  BUN 10 11  CREATININE 0.73 0.96  CALCIUM 9.4 9.0    PT/INR:  Recent Labs  01/16/15 1303  LABPROT 14.9  INR 1.16   Radiology: Dg Chest Port 1 View  01/18/2015   CLINICAL DATA:  History of right pneumothorax, followup  EXAM: PORTABLE CHEST - 1 VIEW  COMPARISON:  Portable chest x-ray of 01/17/2015 and CT chest of 01/16/2015  FINDINGS: The drainage tube overlies the right mid lung. There are still diminished pulmonary markings over the right lung apex consistent with previously demonstrated right loculated hydro pneumothorax or empyema with air-fluid level. Mild volume loss at the left lung base remains. Mild cardiomegaly is stable.  IMPRESSION: Right chest tube remains with no probable change in the right loculated hydro pneumothorax or empyema.   Electronically Signed   By: Ivar Drape M.D.   On: 01/18/2015 08:07     Assessment/Plan: 1 small amt of CT drainage yesterday, no air leak on  H2O seal, poss d/c soon. CXR pretty stable in appearance 2 no fevers, leukocytosis improving 3 cont pulm rx     GOLD,WAYNE E 01/18/2015 12:27 PM  Leave chest tube for now Chest xray improved  I have seen and examined [name] and agree with the above assessment  and plan.  Grace Isaac MD Beeper [phone] Office [phone] 01/18/2015 7:02 PM

## 2015-01-18 NOTE — Progress Notes (Signed)
Patient ID: [name] Day, male   DOB: 1946-02-11, 69 y.o.   MRN: [id number]    Referring Physician(s): Dr. Prescott Gum  Subjective:  [name] Day is in much better spirits today.  He has 2 visitors in his room and they are all laughing and joking.  He states he feels better, but still "hates that tube".  He has no new complaints.  Allergies: Review of patient's allergies indicates no known allergies.  Medications: Prior to Admission medications   Medication Sig Start Date End Date Taking? Authorizing Provider  albuterol (PROVENTIL HFA;VENTOLIN HFA) 108 (90 BASE) MCG/ACT inhaler Inhale 2 puffs into the lungs every 4 (four) hours as needed for wheezing. For wheezing   Yes Historical Provider, MD  aspirin EC 81 MG tablet Take 1 tablet (81 mg total) by mouth daily. 11/17/14  Yes [name], MD  Cholecalciferol 4000 UNITS CAPS Take 1 capsule by mouth daily.   Yes Historical Provider, MD  Fluticasone-Salmeterol (ADVAIR) 100-50 MCG/DOSE AEPB Inhale 1 puff into the lungs 2 (two) times daily.   Yes Historical Provider, MD  glimepiride (AMARYL) 2 MG tablet Take 2 mg by mouth daily with breakfast.   Yes Historical Provider, MD  losartan-hydrochlorothiazide (HYZAAR) 100-12.5 MG per tablet Take 1 tablet by mouth daily.   Yes Historical Provider, MD  oxyCODONE-acetaminophen (PERCOCET) 10-325 MG per tablet Take 2 tablets by mouth every 6 (six) hours as needed for pain. 12/15/14  Yes [name], PA-C  oxymorphone (OPANA ER) 30 MG 12 hr tablet Take 30 mg by mouth every 12 (twelve) hours.   Yes Historical Provider, MD  pravastatin (PRAVACHOL) 20 MG tablet Take 20 mg by mouth daily.    Yes Historical Provider, MD  prochlorperazine (COMPAZINE) 10 MG tablet Take 1 tablet (10 mg total) by mouth every 6 (six) hours as needed for nausea or vomiting. 12/28/14  Yes [name], MD     Vital Signs: BP 130/68 mmHg  Pulse 82  Temp(Src) 98.1 F (36.7 C) (Oral)  Resp 13  Ht '5\' 5"'$  (1.651 m)  Wt 182 lb (82.555  kg)  BMI 30.29 kg/m2  SpO2 92%  Physical Exam   Awake and Alert Sitting on the side of the bed. Chest tube intact, to water seal now. No air leak No recorded chest tube output and visualization of pleur evac is c/w no output over night.  X-ray done today shows no change in right loculated hydro-pneumothorax. Lungs are clear. Tube entry site looks good, no erythema.   Imaging: Dg Chest 2 View  01/15/2015   CLINICAL DATA:  Sudden onset of shortness of breath with fever and productive cough. Patient has lung cancer and is undergoing chemotherapy treatments.  EXAM: CHEST  2 VIEW  COMPARISON:  January 05 2015, June 9th 2016, Dec 08, 2014  FINDINGS: The heart size and mediastinal contours are stable. There is right hydro pneumothorax increased compared to prior exam. There is a right pleural effusion. There is opacity of the right hilar and infrahilar region unchanged. The left lung is clear. The visualized skeletal structures are stable.  IMPRESSION: Right hydro pneumothorax increased compared to prior exam. There is a small right pleural effusion. Consolidation of the right hilar and infrahilar region unchanged compared to prior exam.   Electronically Signed   By: Abelardo Diesel M.D.   On: 01/15/2015 21:15   Ct Chest W Contrast  01/16/2015   CLINICAL DATA:  Empyema. 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 Leukocytosis with fever.  EXAM: CT CHEST WITH CONTRAST  TECHNIQUE: Multidetector CT imaging of the chest was performed during intravenous contrast administration.  CONTRAST:  24m OMNIPAQUE IOHEXOL 300 MG/ML  SOLN  COMPARISON:  09/18/2014  FINDINGS: THORACIC INLET/BODY WALL:  No acute abnormality.  MEDIASTINUM:  No cardiomegaly or pericardial effusion. No acute vascular findings. Interval enlargement of mediastinal and right hilar lymph nodes could be reactive.  LUNG WINDOWS:  There is hydro pneumothorax in the posterior right pleural cavity with fluid  level superiorly and scattered bubbles of non flowing gas at the base. Associated pleural thickening and enhancement, especially inferiorly. Volume of pleural collection is estimated at 30-40%.  There is a band opacity along right upper lobectomy chain sutures which will require surveillance imaging.  Patchy ground-glass opacity in the left lung is new from previous study and likely atelectasis.  Stable 9 mm nodule with cavitation in the left upper lobe along the mediastinum, present on image 14.  UPPER ABDOMEN:  No acute findings.  OSSEOUS:  No acute fracture.  No suspicious lytic or blastic lesions.  IMPRESSION: 1. Moderate right posterior hydropneumothorax. Loculation and pleural thickening is consistent with empyema given the clinical circumstances. 2. New subcarinal and right hilar adenopathy could be reactive, but requires surveillance imaging. 3. Stable 9 mm cavitary nodule in the left upper lobe, suspected neoplasm based on PET-CT April 2016.   Electronically Signed   By: JMonte FantasiaM.D.   On: 01/16/2015 05:23   Dg Chest Port 1 View  01/18/2015   CLINICAL DATA:  History of right pneumothorax, followup  EXAM: PORTABLE CHEST - 1 VIEW  COMPARISON:  Portable chest x-ray of 01/17/2015 and CT chest of 01/16/2015  FINDINGS: The drainage tube overlies the right mid lung. There are still diminished pulmonary markings over the right lung apex consistent with previously demonstrated right loculated hydro pneumothorax or empyema with air-fluid level. Mild volume loss at the left lung base remains. Mild cardiomegaly is stable.  IMPRESSION: Right chest tube remains with no probable change in the right loculated hydro pneumothorax or empyema.   Electronically Signed   By: PIvar DrapeM.D.   On: 01/18/2015 08:07   Dg Chest Port 1 View  01/17/2015   CLINICAL DATA:  Chest tube for hydropneumothorax on right  EXAM: PORTABLE CHEST - 1 VIEW  COMPARISON:  Chest radiograph January 15, 2015; chest CT January 16, 2015 as well as CT  drainage procedure January 16, 2015  FINDINGS: There is a drain in the right hemithorax. The previously noted air-fluid level on the right has virtually completely resolved. There is still a degree of air on the right felt to represent some residual pneumothorax. There is atelectatic change in the right base with volume loss on the right. The left lung is clear. The heart size is normal. Pulmonary vascularity on the left is normal. There is postoperative change on the right causing some distortion of the pulmonary vascularity on the right. No adenopathy is appreciable radiographically.  IMPRESSION: Right chest drain in place with resolution of most of the fluid. There is still a degree of pneumothorax without tension component on the right. There is volume loss on the right with right base atelectatic change. Left lung clear. No change in cardiac silhouette.   Electronically Signed   By: WLowella GripIII M.D.   On: 01/17/2015 07:37   Ct Image Guided Fluid Drain By Catheter  01/16/2015   INDICATION: History of empyema involving the  right upper lobe, post VATS lobectomy and failed right-sided chest tube placement, now with recurrent enlarging symptomatic right-sided hydro pneumothorax. Request made for placement of a CT-guided chest tube.  EXAM: CT IMAGE GUIDED FLUID DRAIN BY CATHETER  COMPARISON:  Chest CT- 02/05/2015 ; chest radiograph- 01/15/2015; 12/13/2014; 11/28/2024  MEDICATIONS: The patient is currently admitted to the hospital and receiving intravenous antibiotics. The antibiotics were administered within an appropriate time frame prior to the initiation of the procedure.  ANESTHESIA/SEDATION: Fentanyl 100 mcg IV; Versed 2 mg IV  Total Moderate Sedation time  20 minutes  CONTRAST:  None  COMPLICATIONS: None immediate  PROCEDURE: Informed written consent was obtained from the patient after a discussion of the risks, benefits and alternatives to treatment. The patient was placed right lateral decubitus on  the CT gantry and a pre procedural CT was performed re-demonstrating the known loculated right-sided hydro pneumothorax. The procedure was planned. A timeout was performed prior to the initiation of the procedure.  The right posterior lower back was prepped and draped in the usual sterile fashion. The overlying soft tissues were anesthetized with 1% lidocaine with epinephrine. Appropriate trajectory was planned with the use of a 22 gauge spinal needle. An 18 gauge trocar needle was advanced into the abscess/fluid collection and a short Amplatz super stiff wire was coiled within the collection. Appropriate positioning was confirmed with a limited CT scan. The tract was serially dilated allowing placement of a 12 [place] all-purpose drainage catheter. Appropriate positioning was confirmed with a limited postprocedural CT scan.  Approximately 30 cc of rust colored fluid was aspirated. The tube was connected to a Pleur-Evac and sutured in place. A dressing was placed. The patient tolerated the procedure well without immediate post procedural complication.  IMPRESSION: Successful CT guided placement of a 71 French all purpose drain catheter into the posterior right pleural space with aspiration of 30 cc of rust colored fluid. Samples were sent to the laboratory as requested by the ordering clinical team.   Electronically Signed   By: Sandi Mariscal M.D.   On: 01/16/2015 17:01    Labs:  CBC:  Recent Labs  01/02/15 0901 01/09/15 1120 01/15/15 2049 01/18/15 0538  WBC 8.5 25.1* 15.8* 12.8*  HGB 14.1 14.2 12.6* 10.8*  HCT 43.2 42.4 38.3* 33.1*  PLT 361 299 259 214    COAGS:  Recent Labs  11/07/14 0757 11/24/14 1352 01/16/15 1303  INR 0.98 1.01 1.16  APTT '29 31 31    '$ BMP:  Recent Labs  12/08/14 0915 12/13/14 0258  01/02/15 0901 01/09/15 1120 01/15/15 2049 01/18/15 0538  NA 131* 131*  < > 139 135* 135 137  K 4.0 4.0  < > 4.5 4.8 3.6 5.0  CL 91* 91*  --   --   --  98* 101  CO2 28 29  < > '28  29 27 29  '$ GLUCOSE 158* 120*  < > 115 147* 122* 95  BUN 17 21*  < > 17.1 11.'5 10 11  '$ CALCIUM 9.3 9.0  < > 10.4 10.1 9.4 9.0  CREATININE 0.86 0.96  < > 0.9 0.9 0.73 0.96  GFRNONAA >60 >60  --   --   --  >60 >60  GFRAA >60 >60  --   --   --  >60 >60  < > = values in this interval not displayed.  LIVER FUNCTION TESTS:  Recent Labs  12/28/14 1424 01/02/15 0901 01/09/15 1120 01/15/15 2049  BILITOT 0.41 0.53 0.78 0.4  AST '21 23 17 '$ 35  ALT '20 24 15 29  '$ ALKPHOS 86 95 157* 110  PROT 7.8 8.0 7.7 7.7  ALBUMIN 3.3* 3.5 3.8 3.3*    Assessment and Plan:  Right Hydro-pneumothorax/Empyema with no change on Xray today and no output in pleur evac. Continue Chest Tube/Drain to water seal. Plan per Cardiothoracic Surgery   Signed: Murrell Redden PA-C 01/18/2015, 9:58 AM   I spent a total of 15 Minutes in face to face in clinical consultation/evaluation, greater than 50% of which was counseling/coordinating care for s/p Right Chest tube/drain

## 2015-01-18 NOTE — Progress Notes (Signed)
PULMONARY / CRITICAL CARE MEDICINE   Name: [name] Day MRN: [id number] DOB: [birth year]    ADMISSION DATE:  01/15/2015 CONSULTATION DATE:  01/16/2015  REFERRING MD : CVTS  CHIEF COMPLAINT:  SOB  BRIEF DESCRIPTION: 69 yo male s/p Rt upper lobectomy 12/04/14 for NSCLC (Squamous cell).  He returned to hospital with fever, dyspnea, and chest pain from Rt empyema.  He is followed by Dr. Halford Chessman for COPD.  STUDIES:  7/05 CT chst >> Rt hydropneumothorax 7/7 CXR >>> chest tube in place, slight improvement in hydropneumothorax.  SIGNIFICANT EVENTS: 7/04 Admit 7/05 IR placed chest tube 7/6 Chest tube to water seal  SUBJECTIVE:  No acute events overnight.  Chest tube placed to water seal yesterday, pt tolerating well.  Has mild pain at tube insertion site, otherwise chest pain and dyspnea improved.  Wants to be discharged on dulera instead of advair (he feels it works better despite being same drug class).  VITAL SIGNS: Temp:  [97.9 F (36.6 C)-99.2 F (37.3 C)] 98.1 F (36.7 C) (07/07 0727) Pulse Rate:  [81-91] 82 (07/07 0432) Resp:  [13-24] 13 (07/07 0432) BP: (100-142)/(55-96) 130/68 mmHg (07/07 0727) SpO2:  [92 %-99 %] 92 % (07/07 0732) INTAKE / OUTPUT:  Intake/Output Summary (Last 24 hours) at 01/18/15 1051 Last data filed at 01/18/15 0616  Gross per 24 hour  Intake 1261.33 ml  Output   2925 ml  Net -1663.67 ml    PHYSICAL EXAMINATION: General: adult male, in nad. Neuro: normal strength. HEENT:  Poor dentition. Cardiovascular: regular. Lungs: crackles in left base.  Posterior chest tube in place, on water seal. Abdomen: non tender. Musculoskeletal: no edema. , LABS:  CBC  Recent Labs Lab 01/15/15 2049 01/18/15 0538  WBC 15.8* 12.8*  HGB 12.6* 10.8*  HCT 38.3* 33.1*  PLT 259 214   Coag's  Recent Labs Lab 01/16/15 1303  APTT 31  INR 1.16   BMET  Recent Labs Lab 01/15/15 2049 01/18/15 0538  NA 135 137  K 3.6 5.0  CL 98* 101  CO2 27 29  BUN 10 11   CREATININE 0.73 0.96  GLUCOSE 122* 95   Electrolytes  Recent Labs Lab 01/15/15 2049 01/18/15 0538  CALCIUM 9.4 9.0   Sepsis Markers  Recent Labs Lab 01/15/15 2100 01/16/15 1303 01/18/15 0538  LATICACIDVEN 0.90  --   --   PROCALCITON  --  <0.10 <0.10   Liver Enzymes  Recent Labs Lab 01/15/15 2049  AST 35  ALT 29  ALKPHOS 110  BILITOT 0.4  ALBUMIN 3.3*   Glucose  Recent Labs Lab 01/16/15 0200 01/16/15 0718 01/17/15 0858 01/18/15 0737  GLUCAP 112* 93 183* 84    Imaging Dg Chest Port 1 View  01/18/2015   CLINICAL DATA:  History of right pneumothorax, followup  EXAM: PORTABLE CHEST - 1 VIEW  COMPARISON:  Portable chest x-ray of 01/17/2015 and CT chest of 01/16/2015  FINDINGS: The drainage tube overlies the right mid lung. There are still diminished pulmonary markings over the right lung apex consistent with previously demonstrated right loculated hydro pneumothorax or empyema with air-fluid level. Mild volume loss at the left lung base remains. Mild cardiomegaly is stable.  IMPRESSION: Right chest tube remains with no probable change in the right loculated hydro pneumothorax or empyema.   Electronically Signed   By: Ivar Drape M.D.   On: 01/18/2015 08:07     ASSESSMENT / PLAN: Blood cx 7/04 >> Pleural fluid cx 7/05 >>  Rt empyema. Plan: -  chest tube per TCTS and IR - f/u CXR - day 4 vancomycin, zosyn  COPD. Plan: - continue dulera, albuterol.  NSCLC (Squamous cell) s/p chemotherapy 01/04/15. Plan: - monitor blood counts  Pain control. Plan: - per primary team   Montey Hora, [place] Pager: [phone]  or [phone] 01/18/2015, 10:56 AM

## 2015-01-19 ENCOUNTER — Inpatient Hospital Stay (HOSPITAL_COMMUNITY): Payer: Medicare Other

## 2015-01-19 DIAGNOSIS — J449 Chronic obstructive pulmonary disease, unspecified: Secondary | ICD-10-CM

## 2015-01-19 DIAGNOSIS — J948 Other specified pleural conditions: Secondary | ICD-10-CM | POA: Insufficient documentation

## 2015-01-19 LAB — GLUCOSE, CAPILLARY: Glucose-Capillary: 143 mg/dL — ABNORMAL HIGH (ref 65–99)

## 2015-01-19 LAB — VANCOMYCIN, TROUGH: Vancomycin Tr: 27 ug/mL (ref 10.0–20.0)

## 2015-01-19 MED ORDER — VANCOMYCIN HCL 10 G IV SOLR
1250.0000 mg | Freq: Two times a day (BID) | INTRAVENOUS | Status: DC
  Filled 2015-01-19 (×2): qty 1250

## 2015-01-19 NOTE — Progress Notes (Signed)
PULMONARY / CRITICAL CARE MEDICINE   Name: [name] Day MRN: [id number] DOB: 1945-07-15    ADMISSION DATE:  01/15/2015 CONSULTATION DATE:  01/16/2015  REFERRING MD : CVTS  CHIEF COMPLAINT:  SOB  BRIEF DESCRIPTION: 69 yo male s/p Rt upper lobectomy 12/04/14 for NSCLC (Squamous cell).  He returned to hospital with fever, dyspnea, and chest pain from Rt empyema.  He is followed by Dr. Halford Chessman for COPD.  STUDIES:  7/05 CT chst >> Rt hydropneumothorax 7/7 CXR >>> chest tube in place, slight improvement in hydropneumothorax.  SIGNIFICANT EVENTS: 7/04 Admit 7/05 IR placed chest tube 7/6 Chest tube to water seal  SUBJECTIVE:  No acute events overnight.  Chest tube placed to water seal 7/6, pt tolerating well.  Has mild pain at tube insertion site, otherwise chest pain and dyspnea improved.  Wants to be discharged on dulera instead of advair (he feels it works better despite being same drug class).  VITAL SIGNS: Temp:  [97.9 F (36.6 C)-98.9 F (37.2 C)] 98.9 F (37.2 C) (07/08 0700) Pulse Rate:  [86-93] 87 (07/08 0440) Resp:  [11-27] 20 (07/08 0812) BP: (122-132)/(60-79) 129/79 mmHg (07/08 0812) SpO2:  [93 %-99 %] 99 % (07/08 0812) INTAKE / OUTPUT:  Intake/Output Summary (Last 24 hours) at 01/19/15 1121 Last data filed at 01/19/15 0900  Gross per 24 hour  Intake   2353 ml  Output   3800 ml  Net  -1447 ml    PHYSICAL EXAMINATION: General: adult male, in nad. Neuro: normal strength. HEENT:  Poor dentition. Cardiovascular: regular. Lungs: crackles in left base.  Posterior chest tube in place, on water seal.no air leak Abdomen: non tender. Musculoskeletal: no edema. , LABS:  CBC  Recent Labs Lab 01/15/15 2049 01/18/15 0538  WBC 15.8* 12.8*  HGB 12.6* 10.8*  HCT 38.3* 33.1*  PLT 259 214   Coag's  Recent Labs Lab 01/16/15 1303  APTT 31  INR 1.16   BMET  Recent Labs Lab 01/15/15 2049 01/18/15 0538  NA 135 137  K 3.6 5.0  CL 98* 101  CO2 27 29  BUN 10 11   CREATININE 0.73 0.96  GLUCOSE 122* 95   Electrolytes  Recent Labs Lab 01/15/15 2049 01/18/15 0538  CALCIUM 9.4 9.0   Sepsis Markers  Recent Labs Lab 01/15/15 2100 01/16/15 1303 01/18/15 0538  LATICACIDVEN 0.90  --   --   PROCALCITON  --  <0.10 <0.10   Liver Enzymes  Recent Labs Lab 01/15/15 2049  AST 35  ALT 29  ALKPHOS 110  BILITOT 0.4  ALBUMIN 3.3*   Glucose  Recent Labs Lab 01/16/15 0200 01/16/15 0718 01/17/15 0858 01/18/15 0737 01/19/15 0746  GLUCAP 112* 93 183* 84 143*    Imaging Dg Chest Port 1 View  01/19/2015   CLINICAL DATA:  Hydro pneumothorax.  EXAM: PORTABLE CHEST - 1 VIEW  COMPARISON:  01/18/2015 01/15/2015 .  CT 01/16/2015.  FINDINGS: Right chest tube in stable position with significant decompression of the previously identified right hydro pneumothorax/empyema. Heart size is stable. Persistent atelectasis and or infiltrate right lung base. Low lung volumes.  IMPRESSION: 1. Right chest tube in stable position. Chest appears stable with significant decompression of the previously identified large right hydro pneumothorax/ empyema. 2. Right lower lobe atelectasis and/or infiltrate again noted.   Electronically Signed   By: Marcello Moores  Register   On: 01/19/2015 07:54     ASSESSMENT / PLAN: Blood cx 7/04 >> Pleural fluid cx 7/05 >>  Rt empyema.  Plan: - chest tube per TCTS and IR - f/u CXR - day 5 vancomycin, zosyn  COPD. Plan: - continue dulera, albuterol.  NSCLC (Squamous cell) s/p chemotherapy 01/04/15. Plan: - monitor blood counts  Pain control. Plan: - per primary team   PCCM will be available as needed  [facility] ACNP Maryanna Shape PCCM Pager [phone] till 3 pm If no answer page [phone] 01/19/2015, 11:21 AM

## 2015-01-19 NOTE — Discharge Instructions (Signed)
Chest Tube, Care After Refer to this sheet in the next few weeks. These instructions provide you with information on caring for yourself after your procedure. Your health care provider may also give you more specific instructions. Your treatment has been planned according to current medical practices, but problems sometimes occur. Call your health care provider if you have any problems or questions after your procedure.  WHAT TO EXPECT AFTER THE PROCEDURE After the procedure, you will have a thin tube that passes through the skin between your ribs and into your chest. It is normal to have some pain or discomfort at the site of the chest tube insertion.  HOME CARE INSTRUCTIONS If you go home with a chest tube still in place, follow these instructions:   Be careful to avoid any activity that may cause your chest tube to become dislodged.  Keep two clamps nearby. If any tube gets disconnected, clamp the tubing closest to your body with both clamps. Call your health care provider for directions. You may need to go to the emergency department if the problem cannot be solved.   Check the tubing often to make sure it is not kinked. When the tubing is kinked, draining will not take place properly, and you may have some trouble breathing.   Take these steps if the chest tube falls out:   Do not panic.   Open a package of gauze coated with petroleum jelly.   Open a package of dry, square gauze.   Cover the wound first with the petroleum jelly gauze and then cover that with the dry, square gauze.   Tape the dry, square gauze in place.   After you have covered the site, call your health care provider for instructions. Your health care provider will decide if you need to go to the emergency department.   You may shower with the chest tube in place if your health care provider approves. Cover the area where the chest tube comes out with a small plastic bag and tape it well. Bring the drainage  device to the shower area and leave it outside the shower curtain or door. The unit should never be under the direct flow of water.   Perform gentle exercise such as walking as directed by your health care provider. Talk to your health care provider about any other activity or exercise you want to do.   Only take over-the-counter or prescription medicines as directed by your health care provider.   Follow up with your health care provider as directed. SEEK MEDICAL CARE IF:  You have redness or swelling at the incision.   Your incision has opened (the edges are not staying together).  You have drainage from the incision area.   Your drainage from the chest tube changes color or becomes red or bloody.   Your pain is not controlled with the medicines prescribed.  SEEK IMMEDIATE MEDICAL CARE IF:  You have a fever.   You have any new trouble with breathing.   You develop any chest pain.   You develop unusual sweating.  You have weakness.  You feel lightheaded, or you faint.  Your connecting tubes become disconnected.  You develop red streaking of the skin that extends above or below the incision.  Your chest tube falls out.  Document Released: 04/20/2013 Document Reviewed: 04/20/2013 [facility] Patient Information 2015 [place], [place]. This information is not intended to replace advice given to you by your health care provider. Make sure you discuss any questions  you have with your health care provider.

## 2015-01-19 NOTE — Progress Notes (Signed)
ANTIBIOTIC CONSULT NOTE - FOLLOW UP  Pharmacy Consult for Vancomycin Indication: HCAP  No Known Allergies  Patient Measurements: Height: '5\' 5"'$  (165.1 cm) Weight: 182 lb (82.555 kg) IBW/kg (Calculated) : 61.5  Vital Signs: Temp: 98.3 F (36.8 C) (07/08 1621) Temp Source: Oral (07/08 1621) BP: 129/68 mmHg (07/08 1118) Intake/Output from previous day: 07/07 0701 - 07/08 0700 In: 2233 [P.O.:1080; I.V.:3; IV Piggyback:1150] Out: 3200 [Urine:3175; Chest Tube:25] Intake/Output from this shift: Total I/O In: 580 [P.O.:580] Out: 1400 [Urine:1400]  Labs:  Recent Labs  01/18/15 0538  WBC 12.8*  HGB 10.8*  PLT 214  CREATININE 0.96   Estimated Creatinine Clearance: 72.8 mL/min (by C-G formula based on Cr of 0.96).  Recent Labs  01/17/15 2111 01/19/15 1635  VANCOTROUGH 10 27*     Microbiology: Recent Results (from the past 720 hour(s))  Culture, blood (routine x 2)     Status: None (Preliminary result)   Collection Time: 01/15/15  8:45 PM  Result Value Ref Range Status   Specimen Description BLOOD RIGHT HAND  Final   Special Requests BOTTLES DRAWN AEROBIC AND ANAEROBIC 5CC  Final   Culture   Final    NO GROWTH 4 DAYS Performed at [facility]    Report Status PENDING  Incomplete  Culture, blood (routine x 2)     Status: None (Preliminary result)   Collection Time: 01/15/15  8:53 PM  Result Value Ref Range Status   Specimen Description BLOOD LAC  Final   Special Requests BOTTLES DRAWN AEROBIC AND ANAEROBIC 5ML  Final   Culture   Final    NO GROWTH 4 DAYS Performed at [facility]    Report Status PENDING  Incomplete  MRSA PCR Screening     Status: None   Collection Time: 01/16/15  2:22 AM  Result Value Ref Range Status   MRSA by PCR NEGATIVE NEGATIVE Final    Comment:        The GeneXpert MRSA Assay (FDA approved for NASAL specimens only), is one component of a comprehensive MRSA colonization surveillance program. It is not intended to  diagnose MRSA infection nor to guide or monitor treatment for MRSA infections.   Culture, routine-abscess     Status: None (Preliminary result)   Collection Time: 01/16/15  4:11 PM  Result Value Ref Range Status   Specimen Description ABSCESS CHEST DRAINAGE  Final   Special Requests Normal  Final   Gram Stain   Final    RARE WBC PRESENT, PREDOMINANTLY PMN NO SQUAMOUS EPITHELIAL CELLS SEEN NO ORGANISMS SEEN Performed at [facility]    Culture   Final    NO GROWTH 2 DAYS Performed at [facility]    Report Status PENDING  Incomplete    Anti-infectives    Start     Dose/Rate Route Frequency Ordered Stop   01/20/15 1000  vancomycin (VANCOCIN) 1,250 mg in sodium chloride 0.9 % 250 mL IVPB     1,250 mg 166.7 mL/hr over 90 Minutes Intravenous Every 12 hours 01/19/15 1737     01/18/15 0600  vancomycin (VANCOCIN) 1,500 mg in sodium chloride 0.9 % 500 mL IVPB  Status:  Discontinued     1,500 mg 250 mL/hr over 120 Minutes Intravenous Every 12 hours 01/17/15 2225 01/19/15 1737   01/16/15 0600  piperacillin-tazobactam (ZOSYN) IVPB 3.375 g     3.375 g 12.5 mL/hr over 240 Minutes Intravenous Every 8 hours 01/15/15 2218     01/15/15 2215  piperacillin-tazobactam (  ZOSYN) IVPB 3.375 g     3.375 g 100 mL/hr over 30 Minutes Intravenous  Once 01/15/15 2208 01/15/15 2249   01/15/15 2215  vancomycin (VANCOCIN) IVPB 1000 mg/200 mL premix  Status:  Discontinued     1,000 mg 200 mL/hr over 60 Minutes Intravenous 2 times daily 01/15/15 2211 01/17/15 2225      Assessment: [name] Day is an 69 y.o. male admitted on 01/15/2015 from [facility] with SOB, fever, sweats and chills. CXR revealed apical posterior pneumnothorax.  Patient previously had a prolonged postop air leak requiring d/c home with chest tube x 3 weeks.  Pharmacy has been consulted to dose empiric Vancomycin and Zosyn for HCAP/empyema.    Patient s/p chest tube/drain placement 7/5 w/ CXR showing near complete resolution of  empyema, small pneumothorax.  Patient to continue chest tube drain for now.    7/6 VT 35 (drawn 30 minutes after infusion vancomycin, therefore expected to be higher)  7/6 PM trough = 10 on Vancomycin 1gm IV q12h regimen 7/8 PM trough = 27 on Vancomycin 1500 mg iv Q 12  BCx2 7/4 >>  7/4 vanc >> 7/4 pip-tazo >>  Goal of Therapy:  Vancomycin trough level 15-20 mcg/ml  Plan:  Hold tonight's vancomycin dose Decrease Vancomycin to 1250 mg iv Q 12 hours (next dose at 10 am tomorrow instead of 6 am).  Continue Zosyn 3.375 gm IV q8h (4 hour infusion).  Follow-up cx data, renal function, and antibiotic plans.  Thank you. Anette Guarneri, PharmD [phone]  01/19/2015 5:37 PM

## 2015-01-19 NOTE — Discharge Summary (Signed)
Physician Discharge Summary  Patient ID: [name] Day MRN: [id number] DOB/AGE: [birth year] 69 y.o.  Admit date: 01/15/2015 Discharge date: 01/20/2015  Admission Diagnoses:  Patient Active Problem List   Diagnosis Date Noted  . Hydropneumothorax   . Centrilobular emphysema   . Empyema lung   . Pneumonia 01/15/2015  . 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 Diabetes 01/04/2015  . S/P lobectomy of lung 11/29/2014  . COPD, moderate 11/15/2014  . Non-small cell cancer of right lung 11/10/2014   Discharge Diagnoses:   Patient Active Problem List   Diagnosis Date Noted  . Hydropneumothorax   . Centrilobular emphysema   . Empyema lung   . Pneumonia 01/15/2015  . 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 Diabetes 01/04/2015  . S/P lobectomy of lung 11/29/2014  . COPD, moderate 11/15/2014  . Non-small cell cancer of right lung 11/10/2014   Discharged Condition: good  History of Present Illness:  [name] Day is a 69 yo white male well known to [facility].  He is S/P Right Upper Lobectomy performed by Dr. Servando Snare on May 18.  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   His previous hospital course was complicated with persistent air leak.  The patient was later converted to a [facility] and discharged home with chest tube in place, 3 weeks after surgery.  The chest tube was successfully removed 2 weeks after hospital discharge in our office.  The patient initially did well after discharge.  He was followed up by Oncology and it was recommended the patient undergo adjuvant chemotherapy.  The patient had completed 1 course of chemotherapy approximately 2 weeks prior to this admission.  However, on 01/16/2015 the patient presented to the ED with complaints of fever, mild productive cough, shortness of breath, and weakness.  He also admitted to a 20 lb weight loss over the past month.  At time of presentation he was noted to be febrile at 101.  CXR was obtained and  showed a loculated hydropneumothorax in the apical posterior area.  He was started on empiric Vanc and Zosyn.  [facility] consult was placed and it was felt the patient should be transferred to [facility] for further treatment.  Hospital Course:   Upon arrival to [facility] IR consult was obtained.  They were agreeable to a CT guided chest tube placement.  This was done and removed 30 cc of pleural fluid which was sent for cytology and cultures.  He was monitored by Pulmonary Critical care during hospitalization.  Follow up CXR showed resolution of effusion, but did show evidence of pneumothorax.  The patient had issues with pain control.  However, he has a history of chronic pain and medications were adjusted to his home regimen.  His chest tube has remained free from air leak and was transitioned to water seal. Output has remained minimal.  Cultures obtained from Thoracentesis have remained negative to date.  Chest tube was able to be removed on 01/19/2015.  Follow up CXR remained stable.  He is ambulating with minimal difficulty.  His pain has been controlled.  He has completed a course of IV antibiotics and will be switched to po Augmentin x 1 week at discharge.  He is felt medically stable for discharge home on 01/20/2015.        Consults: pulmonary/intensive care and interventional radiology  Treatments: CT guided chest tube, ABX  Disposition: 06-Home-Health Care Svc     Medication List    TAKE these  medications        albuterol 108 (90 BASE) MCG/ACT inhaler  Commonly known as:  PROVENTIL HFA;VENTOLIN HFA  Inhale 2 puffs into the lungs every 4 (four) hours as needed for wheezing. For wheezing     amoxicillin-clavulanate 875-125 MG per tablet  Commonly known as:  AUGMENTIN  Take 1 tablet by mouth every 12 (twelve) hours. X 7 days     aspirin EC 81 MG tablet  Take 1 tablet (81 mg total) by mouth daily.     Cholecalciferol 4000 UNITS Caps  Take 1 capsule by mouth daily.     fentaNYL 75  MCG/HR  Commonly known as:  DURAGESIC - dosed mcg/hr  Place 1 patch (75 mcg total) onto the skin every 3 (three) days.     Fluticasone-Salmeterol 100-50 MCG/DOSE Aepb  Commonly known as:  ADVAIR  Inhale 1 puff into the lungs 2 (two) times daily.     glimepiride 2 MG tablet  Commonly known as:  AMARYL  Take 2 mg by mouth daily with breakfast.     losartan-hydrochlorothiazide 100-12.5 MG per tablet  Commonly known as:  HYZAAR  Take 1 tablet by mouth daily.     oxyCODONE-acetaminophen 10-325 MG per tablet  Commonly known as:  PERCOCET  Take 2 tablets by mouth every 6 (six) hours as needed for pain.     oxymorphone 30 MG 12 hr tablet  Commonly known as:  OPANA ER  Take 30 mg by mouth every 12 (twelve) hours.     pravastatin 20 MG tablet  Commonly known as:  PRAVACHOL  Take 20 mg by mouth daily.     prochlorperazine 10 MG tablet  Commonly known as:  COMPAZINE  Take 1 tablet (10 mg total) by mouth every 6 (six) hours as needed for nausea or vomiting.       Follow-up Information    Follow up with [facility].   Why:  Home HEALTH RN arranged    Contact information:   [address] [place] [address] [phone]       Follow up with Grace Isaac, MD On 01/25/2015.   Specialty:  Cardiothoracic Surgery   Why:  Appointment is at 11:30   Contact information:   [address] [place] [address] [phone]       Follow up with [facility] On 01/25/2015.   Why:  Please get CXR at 11:00   Contact information:   [facility]       Signed: Burke Keels 01/20/2015, 10:12 AM

## 2015-01-19 NOTE — Progress Notes (Addendum)
[facility] DAILY ICU PROGRESS NOTE                   [address]            [place] [address]          [phone]        Total Length of Stay:  LOS: 4 days   Subjective: Feels ok, wants to go home  Objective: Vital signs in last 24 hours: Temp:  [97.9 F (36.6 C)-98.9 F (37.2 C)] 98.9 F (37.2 C) (07/08 0700) Pulse Rate:  [86-93] 87 (07/08 0440) Cardiac Rhythm:  [-] Normal sinus rhythm (07/08 0046) Resp:  [11-27] 11 (07/08 0440) BP: (122-132)/(60-69) 132/64 mmHg (07/08 0440) SpO2:  [93 %] 93 % (07/08 0440)  Filed Weights   01/15/15 2208  Weight: 182 lb (82.555 kg)    Weight change:    Hemodynamic parameters for last 24 hours:    Intake/Output from previous day: 07/07 0701 - 07/08 0700 In: 2233 [P.O.:1080; I.V.:3; IV Piggyback:1150] Out: 3200 [Urine:3175; Chest Tube:25]  Intake/Output this shift:    Current Meds: Scheduled Meds: . aspirin EC  81 mg Oral Daily  . docusate sodium  100 mg Oral BID  . enoxaparin (LOVENOX) injection  40 mg Subcutaneous Q24H  . fentaNYL  75 mcg Transdermal Q72H  . glimepiride  2 mg Oral Q breakfast  . mometasone-formoterol  2 puff Inhalation BID  . morphine  30 mg Oral Q12H  . morphine  60 mg Oral Q12H  . multivitamin with minerals  1 tablet Oral Daily  . pantoprazole (PROTONIX) IV  40 mg Intravenous Q24H  . piperacillin-tazobactam (ZOSYN)  IV  3.375 g Intravenous Q8H  . pravastatin  20 mg Oral q1800  . sodium chloride  3 mL Intravenous Q12H  . vancomycin  1,500 mg Intravenous Q12H   Continuous Infusions: . sodium chloride 10 mL/hr at 01/17/15 1152   PRN Meds:.acetaminophen **OR** acetaminophen, albuterol, alum & mag hydroxide-simeth, ondansetron **OR** ondansetron (ZOFRAN) IV, oxyCODONE-acetaminophen, sorbitol  General appearance: alert, cooperative and no distress Heart: regular rate and rhythm Lungs: clear to auscultation bilaterally Abdomen: benign Extremities: mild edema Wound: healing well  Lab  Results: CBC: Recent Labs  01/18/15 0538  WBC 12.8*  HGB 10.8*  HCT 33.1*  PLT 214   BMET:  Recent Labs  01/18/15 0538  NA 137  K 5.0  CL 101  CO2 29  GLUCOSE 95  BUN 11  CREATININE 0.96  CALCIUM 9.0    PT/INR:  Recent Labs  01/16/15 1303  LABPROT 14.9  INR 1.16   Radiology: No results found.   Assessment/Plan: 1 steady progress, no fevers 2 no drainage from CT- cont for now 3 aggressive pulm toilet/rx, cont current abx     GOLD,WAYNE E 01/19/2015 7:55 AM   Recent Results (from the past 240 hour(s))  Culture, blood (routine x 2)     Status: None (Preliminary result)   Collection Time: 01/15/15  8:45 PM  Result Value Ref Range Status   Specimen Description BLOOD RIGHT HAND  Final   Special Requests BOTTLES DRAWN AEROBIC AND ANAEROBIC 5CC  Final   Culture   Final    NO GROWTH 4 DAYS Performed at [facility]    Report Status PENDING  Incomplete  Culture, blood (routine x 2)     Status: None (Preliminary result)   Collection Time: 01/15/15  8:53 PM  Result Value Ref Range Status   Specimen Description  BLOOD LAC  Final   Special Requests BOTTLES DRAWN AEROBIC AND ANAEROBIC 5ML  Final   Culture   Final    NO GROWTH 4 DAYS Performed at [facility]    Report Status PENDING  Incomplete  MRSA PCR Screening     Status: None   Collection Time: 01/16/15  2:22 AM  Result Value Ref Range Status   MRSA by PCR NEGATIVE NEGATIVE Final    Comment:        The GeneXpert MRSA Assay (FDA approved for NASAL specimens only), is one component of a comprehensive MRSA colonization surveillance program. It is not intended to diagnose MRSA infection nor to guide or monitor treatment for MRSA infections.   Culture, routine-abscess     Status: None (Preliminary result)   Collection Time: 01/16/15  4:11 PM  Result Value Ref Range Status   Specimen Description ABSCESS CHEST DRAINAGE  Final   Special Requests Normal  Final   Gram Stain   Final    RARE  WBC PRESENT, PREDOMINANTLY PMN NO SQUAMOUS EPITHELIAL CELLS SEEN NO ORGANISMS SEEN Performed at [facility]    Culture   Final    NO GROWTH 2 DAYS Performed at [facility]    Report Status PENDING  Incomplete   No drainage or air leak from chest tube will d/c Follow up pa and lat chest xray in am I have seen and examined [name] and agree with the above assessment  and plan.  Grace Isaac MD [facility] [phone] Office [phone] 01/19/2015 4:34 PM

## 2015-01-19 NOTE — Progress Notes (Signed)
Referring Physician(s): Gerhardt  Subjective:  R empyema; hydropneumothorax R chest tube drain placed 7/5 Up in chair Better daily Wants to "go home" CXR today shows significant decompression of Rt hydropneumothorax/empyema   Allergies: Review of patient's allergies indicates no known allergies.  Medications: Prior to Admission medications   Medication Sig Start Date End Date Taking? Authorizing Provider  albuterol (PROVENTIL HFA;VENTOLIN HFA) 108 (90 BASE) MCG/ACT inhaler Inhale 2 puffs into the lungs every 4 (four) hours as needed for wheezing. For wheezing   Yes Historical Provider, MD  aspirin EC 81 MG tablet Take 1 tablet (81 mg total) by mouth daily. 11/17/14  Yes [name], MD  Cholecalciferol 4000 UNITS CAPS Take 1 capsule by mouth daily.   Yes Historical Provider, MD  Fluticasone-Salmeterol (ADVAIR) 100-50 MCG/DOSE AEPB Inhale 1 puff into the lungs 2 (two) times daily.   Yes Historical Provider, MD  glimepiride (AMARYL) 2 MG tablet Take 2 mg by mouth daily with breakfast.   Yes Historical Provider, MD  losartan-hydrochlorothiazide (HYZAAR) 100-12.5 MG per tablet Take 1 tablet by mouth daily.   Yes Historical Provider, MD  oxyCODONE-acetaminophen (PERCOCET) 10-325 MG per tablet Take 2 tablets by mouth every 6 (six) hours as needed for pain. 12/15/14  Yes [name], PA-C  oxymorphone (OPANA ER) 30 MG 12 hr tablet Take 30 mg by mouth every 12 (twelve) hours.   Yes Historical Provider, MD  pravastatin (PRAVACHOL) 20 MG tablet Take 20 mg by mouth daily.    Yes Historical Provider, MD  prochlorperazine (COMPAZINE) 10 MG tablet Take 1 tablet (10 mg total) by mouth every 6 (six) hours as needed for nausea or vomiting. 12/28/14  Yes [name], MD     Vital Signs: BP 129/68 mmHg  Pulse 87  Temp(Src) 99 F (37.2 C) (Oral)  Resp 19  Ht '5\' 5"'$  (1.651 m)  Wt 182 lb (82.555 kg)  BMI 30.29 kg/m2  SpO2 99%  Physical Exam  Pulmonary/Chest: Effort normal and breath  sounds normal. He has no wheezes.  Site of R chest tube drain is clean and dry NT no bleeding Intact afeb Output 120 cc in pleurvac No air leak  Nursing note and vitals reviewed.   Imaging: Dg Chest 2 View  01/15/2015   CLINICAL DATA:  Sudden onset of shortness of breath with fever and productive cough. Patient has lung cancer and is undergoing chemotherapy treatments.  EXAM: CHEST  2 VIEW  COMPARISON:  January 05 2015, June 9th 2016, Dec 08, 2014  FINDINGS: The heart size and mediastinal contours are stable. There is right hydro pneumothorax increased compared to prior exam. There is a right pleural effusion. There is opacity of the right hilar and infrahilar region unchanged. The left lung is clear. The visualized skeletal structures are stable.  IMPRESSION: Right hydro pneumothorax increased compared to prior exam. There is a small right pleural effusion. Consolidation of the right hilar and infrahilar region unchanged compared to prior exam.   Electronically Signed   By: Abelardo Diesel M.D.   On: 01/15/2015 21:15   Ct Chest W Contrast  01/16/2015   CLINICAL DATA:  Empyema. 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 Leukocytosis with fever.  EXAM: CT CHEST WITH CONTRAST  TECHNIQUE: Multidetector CT imaging of the chest was performed during intravenous contrast administration.  CONTRAST:  59m OMNIPAQUE IOHEXOL 300 MG/ML  SOLN  COMPARISON:  09/18/2014  FINDINGS: THORACIC INLET/BODY WALL:  No acute abnormality.  MEDIASTINUM:  No cardiomegaly or pericardial effusion. No acute vascular findings. Interval enlargement of mediastinal and right hilar lymph nodes could be reactive.  LUNG WINDOWS:  There is hydro pneumothorax in the posterior right pleural cavity with fluid level superiorly and scattered bubbles of non flowing gas at the base. Associated pleural thickening and enhancement, especially inferiorly. Volume of pleural collection is estimated at 30-40%.   There is a band opacity along right upper lobectomy chain sutures which will require surveillance imaging.  Patchy ground-glass opacity in the left lung is new from previous study and likely atelectasis.  Stable 9 mm nodule with cavitation in the left upper lobe along the mediastinum, present on image 14.  UPPER ABDOMEN:  No acute findings.  OSSEOUS:  No acute fracture.  No suspicious lytic or blastic lesions.  IMPRESSION: 1. Moderate right posterior hydropneumothorax. Loculation and pleural thickening is consistent with empyema given the clinical circumstances. 2. New subcarinal and right hilar adenopathy could be reactive, but requires surveillance imaging. 3. Stable 9 mm cavitary nodule in the left upper lobe, suspected neoplasm based on PET-CT April 2016.   Electronically Signed   By: Monte Fantasia M.D.   On: 01/16/2015 05:23   Dg Chest Port 1 View  01/19/2015   CLINICAL DATA:  Hydro pneumothorax.  EXAM: PORTABLE CHEST - 1 VIEW  COMPARISON:  01/18/2015 01/15/2015 .  CT 01/16/2015.  FINDINGS: Right chest tube in stable position with significant decompression of the previously identified right hydro pneumothorax/empyema. Heart size is stable. Persistent atelectasis and or infiltrate right lung base. Low lung volumes.  IMPRESSION: 1. Right chest tube in stable position. Chest appears stable with significant decompression of the previously identified large right hydro pneumothorax/ empyema. 2. Right lower lobe atelectasis and/or infiltrate again noted.   Electronically Signed   By: Marcello Moores  Register   On: 01/19/2015 07:54   Dg Chest Port 1 View  01/18/2015   CLINICAL DATA:  History of right pneumothorax, followup  EXAM: PORTABLE CHEST - 1 VIEW  COMPARISON:  Portable chest x-ray of 01/17/2015 and CT chest of 01/16/2015  FINDINGS: The drainage tube overlies the right mid lung. There are still diminished pulmonary markings over the right lung apex consistent with previously demonstrated right loculated hydro  pneumothorax or empyema with air-fluid level. Mild volume loss at the left lung base remains. Mild cardiomegaly is stable.  IMPRESSION: Right chest tube remains with no probable change in the right loculated hydro pneumothorax or empyema.   Electronically Signed   By: Ivar Drape M.D.   On: 01/18/2015 08:07   Dg Chest Port 1 View  01/17/2015   CLINICAL DATA:  Chest tube for hydropneumothorax on right  EXAM: PORTABLE CHEST - 1 VIEW  COMPARISON:  Chest radiograph January 15, 2015; chest CT January 16, 2015 as well as CT drainage procedure January 16, 2015  FINDINGS: There is a drain in the right hemithorax. The previously noted air-fluid level on the right has virtually completely resolved. There is still a degree of air on the right felt to represent some residual pneumothorax. There is atelectatic change in the right base with volume loss on the right. The left lung is clear. The heart size is normal. Pulmonary vascularity on the left is normal. There is postoperative change on the right causing some distortion of the pulmonary vascularity on the right. No adenopathy is appreciable radiographically.  IMPRESSION: Right chest drain in place with resolution of most of the fluid. There is still a degree of  pneumothorax without tension component on the right. There is volume loss on the right with right base atelectatic change. Left lung clear. No change in cardiac silhouette.   Electronically Signed   By: [name] M.D.   On: 01/17/2015 07:37   Ct Image Guided Fluid Drain By Catheter  01/16/2015   INDICATION: History of empyema involving the right upper lobe, post VATS lobectomy and failed right-sided chest tube placement, now with recurrent enlarging symptomatic right-sided hydro pneumothorax. Request made for placement of a CT-guided chest tube.  EXAM: CT IMAGE GUIDED FLUID DRAIN BY CATHETER  COMPARISON:  Chest CT- 02/05/2015 ; chest radiograph- 01/15/2015; 12/13/2014; 11/28/2024  MEDICATIONS: The patient is  currently admitted to the hospital and receiving intravenous antibiotics. The antibiotics were administered within an appropriate time frame prior to the initiation of the procedure.  ANESTHESIA/SEDATION: Fentanyl 100 mcg IV; Versed 2 mg IV  Total Moderate Sedation time  20 minutes  CONTRAST:  None  COMPLICATIONS: None immediate  PROCEDURE: Informed written consent was obtained from the patient after a discussion of the risks, benefits and alternatives to treatment. The patient was placed right lateral decubitus on the CT gantry and a pre procedural CT was performed re-demonstrating the known loculated right-sided hydro pneumothorax. The procedure was planned. A timeout was performed prior to the initiation of the procedure.  The right posterior lower back was prepped and draped in the usual sterile fashion. The overlying soft tissues were anesthetized with 1% lidocaine with epinephrine. Appropriate trajectory was planned with the use of a 22 gauge spinal needle. An 18 gauge trocar needle was advanced into the abscess/fluid collection and a short Amplatz super stiff wire was coiled within the collection. Appropriate positioning was confirmed with a limited CT scan. The tract was serially dilated allowing placement of a 12 [place] all-purpose drainage catheter. Appropriate positioning was confirmed with a limited postprocedural CT scan.  Approximately 30 cc of rust colored fluid was aspirated. The tube was connected to a Pleur-Evac and sutured in place. A dressing was placed. The patient tolerated the procedure well without immediate post procedural complication.  IMPRESSION: Successful CT guided placement of a 74 French all purpose drain catheter into the posterior right pleural space with aspiration of 30 cc of rust colored fluid. Samples were sent to the laboratory as requested by the ordering clinical team.   Electronically Signed   By: Sandi Mariscal M.D.   On: 01/16/2015 17:01    Labs:  CBC:  Recent Labs   01/02/15 0901 01/09/15 1120 01/15/15 2049 01/18/15 0538  WBC 8.5 25.1* 15.8* 12.8*  HGB 14.1 14.2 12.6* 10.8*  HCT 43.2 42.4 38.3* 33.1*  PLT 361 299 259 214    COAGS:  Recent Labs  11/07/14 0757 11/24/14 1352 01/16/15 1303  INR 0.98 1.01 1.16  APTT '29 31 31    '$ BMP:  Recent Labs  12/08/14 0915 12/13/14 0258  01/02/15 0901 01/09/15 1120 01/15/15 2049 01/18/15 0538  NA 131* 131*  < > 139 135* 135 137  K 4.0 4.0  < > 4.5 4.8 3.6 5.0  CL 91* 91*  --   --   --  98* 101  CO2 28 29  < > '28 29 27 29  '$ GLUCOSE 158* 120*  < > 115 147* 122* 95  BUN 17 21*  < > 17.1 11.'5 10 11  '$ CALCIUM 9.3 9.0  < > 10.4 10.1 9.4 9.0  CREATININE 0.86 0.96  < > 0.9 0.9 0.73 0.96  GFRNONAA >60 >60  --   --   --  >60 >60  GFRAA >60 >60  --   --   --  >60 >60  < > = values in this interval not displayed.  LIVER FUNCTION TESTS:  Recent Labs  12/28/14 1424 01/02/15 0901 01/09/15 1120 01/15/15 2049  BILITOT 0.41 0.53 0.78 0.4  AST '21 23 17 '$ 35  ALT '20 24 15 29  '$ ALKPHOS 86 95 157* 110  PROT 7.8 8.0 7.7 7.7  ALBUMIN 3.3* 3.5 3.8 3.3*    Assessment and Plan:  Rt empyema Rt chest tube drain intact Plan per Dr Servando Snare   Signed: Monia Sabal A 01/19/2015, 12:54 PM   I spent a total of 15 Minutes in face to face in clinical consultation/evaluation, greater than 50% of which was counseling/coordinating care for Rt chest tube drain

## 2015-01-19 NOTE — Addendum Note (Signed)
Addendum  created 01/19/15 0843 by Josephine Igo, CRNA   Modules edited: Anesthesia Responsible Staff

## 2015-01-19 NOTE — Care Management (Signed)
Medicare Important Message given? YES (If response is "NO", the following Medicare IM given date fields will be blank) Date Medicare IM given:01/19/15 Medicare IM given by: Whitman Hero

## 2015-01-20 ENCOUNTER — Inpatient Hospital Stay (HOSPITAL_COMMUNITY): Payer: Medicare Other

## 2015-01-20 LAB — CULTURE, BLOOD (ROUTINE X 2)
Culture: NO GROWTH
Culture: NO GROWTH

## 2015-01-20 LAB — GLUCOSE, CAPILLARY: Glucose-Capillary: 90 mg/dL (ref 65–99)

## 2015-01-20 LAB — CULTURE, ROUTINE-ABSCESS
Culture: NO GROWTH
Special Requests: NORMAL

## 2015-01-20 LAB — PROCALCITONIN: Procalcitonin: <0.1 ng/mL

## 2015-01-20 MED ORDER — AMOXICILLIN-POT CLAVULANATE 875-125 MG PO TABS: 1.0000 | ORAL_TABLET | Freq: Two times a day (BID) | ORAL | Status: DC

## 2015-01-20 MED ORDER — FENTANYL 75 MCG/HR TD PT72: 75.0000 ug | MEDICATED_PATCH | TRANSDERMAL | Status: DC

## 2015-01-20 MED ORDER — AMOXICILLIN-POT CLAVULANATE 875-125 MG PO TABS
1.0000 | ORAL_TABLET | Freq: Two times a day (BID) | ORAL | Status: DC
  Filled 2015-01-20 (×2): qty 1

## 2015-01-20 NOTE — Progress Notes (Signed)
Referring Physician(s): Gerhardt  Subjective: R empyema; hydropneumothorax R chest tube drain placed 7/5 Out walking in hall. Denies SO, DOE Tube has been removed since we saw pt yesterday.   Allergies: Review of patient's allergies indicates no known allergies.  Medications: Prior to Admission medications   Medication Sig Start Date End Date Taking? Authorizing Provider  albuterol (PROVENTIL HFA;VENTOLIN HFA) 108 (90 BASE) MCG/ACT inhaler Inhale 2 puffs into the lungs every 4 (four) hours as needed for wheezing. For wheezing   Yes Historical Provider, MD  aspirin EC 81 MG tablet Take 1 tablet (81 mg total) by mouth daily. 11/17/14  Yes [name], MD  Cholecalciferol 4000 UNITS CAPS Take 1 capsule by mouth daily.   Yes Historical Provider, MD  Fluticasone-Salmeterol (ADVAIR) 100-50 MCG/DOSE AEPB Inhale 1 puff into the lungs 2 (two) times daily.   Yes Historical Provider, MD  glimepiride (AMARYL) 2 MG tablet Take 2 mg by mouth daily with breakfast.   Yes Historical Provider, MD  losartan-hydrochlorothiazide (HYZAAR) 100-12.5 MG per tablet Take 1 tablet by mouth daily.   Yes Historical Provider, MD  oxyCODONE-acetaminophen (PERCOCET) 10-325 MG per tablet Take 2 tablets by mouth every 6 (six) hours as needed for pain. 12/15/14  Yes [name], PA-C  oxymorphone (OPANA ER) 30 MG 12 hr tablet Take 30 mg by mouth every 12 (twelve) hours.   Yes Historical Provider, MD  pravastatin (PRAVACHOL) 20 MG tablet Take 20 mg by mouth daily.    Yes Historical Provider, MD  prochlorperazine (COMPAZINE) 10 MG tablet Take 1 tablet (10 mg total) by mouth every 6 (six) hours as needed for nausea or vomiting. 12/28/14  Yes [name], MD     Vital Signs: BP 115/64 mmHg  Pulse 89  Temp(Src) 98.4 F (36.9 C) (Oral)  Resp 21  Ht '5\' 5"'$  (1.651 m)  Wt 182 lb (82.555 kg)  BMI 30.29 kg/m2  SpO2 97%  Physical Exam  Pulmonary/Chest: Effort normal. No respiratory distress. He has no wheezes.    Dressing over chest tube site, clean, NT  Nursing note and vitals reviewed.   Imaging: Dg Chest 2 View  01/20/2015 IMPRESSION: Interval increase in air and fluid within the right apical hydropneumothorax status post removal right chest tube.    Ct Image Guided Fluid Drain By Catheter  01/16/2015   INDICATION: History of empyema involving the right upper lobe, post VATS lobectomy and failed right-sided chest tube placement, now with recurrent enlarging symptomatic right-sided hydro pneumothorax. Request made for placement of a CT-guided chest tube.  EXAM: CT IMAGE GUIDED FLUID DRAIN BY CATHETER  COMPARISON:  Chest CT- 02/05/2015 ; chest radiograph- 01/15/2015; 12/13/2014; 11/28/2024  MEDICATIONS: The patient is currently admitted to the hospital and receiving intravenous antibiotics. The antibiotics were administered within an appropriate time frame prior to the initiation of the procedure.  ANESTHESIA/SEDATION: Fentanyl 100 mcg IV; Versed 2 mg IV  Total Moderate Sedation time  20 minutes  CONTRAST:  None  COMPLICATIONS: None immediate  PROCEDURE: Informed written consent was obtained from the patient after a discussion of the risks, benefits and alternatives to treatment. The patient was placed right lateral decubitus on the CT gantry and a pre procedural CT was performed re-demonstrating the known loculated right-sided hydro pneumothorax. The procedure was planned. A timeout was performed prior to the initiation of the procedure.  The right posterior lower back was prepped and draped in the usual sterile fashion. The overlying soft tissues were anesthetized with 1%  lidocaine with epinephrine. Appropriate trajectory was planned with the use of a 22 gauge spinal needle. An 18 gauge trocar needle was advanced into the abscess/fluid collection and a short Amplatz super stiff wire was coiled within the collection. Appropriate positioning was confirmed with a limited CT scan. The tract was serially dilated  allowing placement of a 12 [place] all-purpose drainage catheter. Appropriate positioning was confirmed with a limited postprocedural CT scan.  Approximately 30 cc of rust colored fluid was aspirated. The tube was connected to a Pleur-Evac and sutured in place. A dressing was placed. The patient tolerated the procedure well without immediate post procedural complication.  IMPRESSION: Successful CT guided placement of a 78 French all purpose drain catheter into the posterior right pleural space with aspiration of 30 cc of rust colored fluid. Samples were sent to the laboratory as requested by the ordering clinical team.   Electronically Signed   By: Sandi Mariscal M.D.   On: 01/16/2015 17:01    Labs:  CBC:  Recent Labs  01/02/15 0901 01/09/15 1120 01/15/15 2049 01/18/15 0538  WBC 8.5 25.1* 15.8* 12.8*  HGB 14.1 14.2 12.6* 10.8*  HCT 43.2 42.4 38.3* 33.1*  PLT 361 299 259 214    COAGS:  Recent Labs  11/07/14 0757 11/24/14 1352 01/16/15 1303  INR 0.98 1.01 1.16  APTT '29 31 31    '$ BMP:  Recent Labs  12/08/14 0915 12/13/14 0258  01/02/15 0901 01/09/15 1120 01/15/15 2049 01/18/15 0538  NA 131* 131*  < > 139 135* 135 137  K 4.0 4.0  < > 4.5 4.8 3.6 5.0  CL 91* 91*  --   --   --  98* 101  CO2 28 29  < > '28 29 27 29  '$ GLUCOSE 158* 120*  < > 115 147* 122* 95  BUN 17 21*  < > 17.1 11.'5 10 11  '$ CALCIUM 9.3 9.0  < > 10.4 10.1 9.4 9.0  CREATININE 0.86 0.96  < > 0.9 0.9 0.73 0.96  GFRNONAA >60 >60  --   --   --  >60 >60  GFRAA >60 >60  --   --   --  >60 >60  < > = values in this interval not displayed.  LIVER FUNCTION TESTS:  Recent Labs  12/28/14 1424 01/02/15 0901 01/09/15 1120 01/15/15 2049  BILITOT 0.41 0.53 0.78 0.4  AST '21 23 17 '$ 35  ALT '20 24 15 29  '$ ALKPHOS 86 95 157* 110  PROT 7.8 8.0 7.7 7.7  ALBUMIN 3.3* 3.5 3.8 3.3*    Assessment and Plan:  Rt upper empyema/hydropneumothoarx s/p chest tube 7/5 Chest tube has been removed. HydroPTX has now recurred since  chest tube removal. Await input from CTS   Signed: Quinley Nesler 01/20/2015, 10:00 AM

## 2015-01-20 NOTE — Progress Notes (Addendum)
       [address]       [place] [address]             [phone]               Subjective: Comfortable, no complaints.  Cough decreased, no pain.   Objective: Vital signs in last 24 hours: Patient Vitals for the past 24 hrs:  BP Temp Temp src Pulse Resp SpO2  01/20/15 0422 115/64 mmHg 98.5 F (36.9 C) Oral 89 (!) 21 98 %  01/20/15 0045 127/75 mmHg 99.2 F (37.3 C) Oral 93 17 99 %  01/19/15 1952 111/72 mmHg 97.9 F (36.6 C) Oral 84 (!) 21 98 %  01/19/15 1621 127/74 mmHg 98.3 F (36.8 C) Oral - (!) 22 99 %  01/19/15 1149 - 99 F (37.2 C) Oral - - -  01/19/15 1118 129/68 mmHg - - - 19 99 %   Current Weight  01/15/15 182 lb (82.555 kg)     Intake/Output from previous day: 07/08 0701 - 07/09 0700 In: 1583 [P.O.:1060; I.V.:423; IV Piggyback:100] Out: 1400 [Urine:1400]    PHYSICAL EXAM:  Heart: RRR Lungs: Decreased BS on R but clear Wound: Stable, healing well     Lab Results: CBC: Recent Labs  01/18/15 0538  WBC 12.8*  HGB 10.8*  HCT 33.1*  PLT 214   BMET:  Recent Labs  01/18/15 0538  NA 137  K 5.0  CL 101  CO2 29  GLUCOSE 95  BUN 11  CREATININE 0.96  CALCIUM 9.0    PT/INR: No results for input(s): LABPROT, INR in the last 72 hours.    Assessment/Plan: CXR stable, off O2.  Plan d/c home today on po Augmentin x 7 days.  Will follow up in office this week.   Chemo on hold for now.   LOS: 5 days    COLLINS,GINA H 01/20/2015  Patient seen today, ambulating around unit quickly without sob, no fever or chills, chest xray this am reviewed and back to baseline with apical space present since surgery Home on additional week antibiotics, wait on further chemo on Tuesday, I am seeing in office Thursday July 14 I have seen and examined [name] Day and agree with the above assessment  and plan.  Grace Isaac MD Beeper [phone] Office [phone] 01/20/2015 10:20 AM

## 2015-01-20 NOTE — Progress Notes (Signed)
Pt to d/c home this am, VSS, reviewed d/c istructions, meds, f/up appts, etc. PT and family indicated understanding.

## 2015-01-22 DIAGNOSIS — C3491 Malignant neoplasm of unspecified part of right bronchus or lung: Secondary | ICD-10-CM | POA: Diagnosis not present

## 2015-01-23 ENCOUNTER — Ambulatory Visit (HOSPITAL_BASED_OUTPATIENT_CLINIC_OR_DEPARTMENT_OTHER): Payer: Medicare Other | Admitting: Internal Medicine

## 2015-01-23 ENCOUNTER — Encounter: Payer: Self-pay | Admitting: Internal Medicine

## 2015-01-23 ENCOUNTER — Ambulatory Visit: Payer: Medicare Other

## 2015-01-23 ENCOUNTER — Other Ambulatory Visit (HOSPITAL_BASED_OUTPATIENT_CLINIC_OR_DEPARTMENT_OTHER): Payer: Medicare Other

## 2015-01-23 ENCOUNTER — Telehealth: Payer: Self-pay | Admitting: Internal Medicine

## 2015-01-23 VITALS — BP 136/60 | HR 113 | Temp 98.5°F | Resp 19 | Ht 65.0 in | Wt 189.2 lb

## 2015-01-23 DIAGNOSIS — C3411 Malignant neoplasm of upper lobe, right bronchus or lung: Secondary | ICD-10-CM

## 2015-01-23 DIAGNOSIS — C3491 Malignant neoplasm of unspecified part of right bronchus or lung: Secondary | ICD-10-CM

## 2015-01-23 DIAGNOSIS — D649 Anemia, unspecified: Secondary | ICD-10-CM | POA: Diagnosis not present

## 2015-01-23 LAB — COMPREHENSIVE METABOLIC PANEL (CC13)
ALT: 46 U/L (ref 0–55)
ANION GAP: 9 meq/L (ref 3–11)
AST: 45 U/L — AB (ref 5–34)
Albumin: 2.9 g/dL — ABNORMAL LOW (ref 3.5–5.0)
Alkaline Phosphatase: 93 U/L (ref 40–150)
BUN: 13 mg/dL (ref 7.0–26.0)
CALCIUM: 9.7 mg/dL (ref 8.4–10.4)
CHLORIDE: 99 meq/L (ref 98–109)
CO2: 27 mEq/L (ref 22–29)
CREATININE: 0.8 mg/dL (ref 0.7–1.3)
EGFR: >90 mL/min/{1.73_m2} (ref >90)
Glucose: 152 mg/dl — ABNORMAL HIGH (ref 70–140)
POTASSIUM: 3.8 meq/L (ref 3.5–5.1)
SODIUM: 136 meq/L (ref 136–145)
Total Bilirubin: 0.51 mg/dL (ref 0.20–1.20)
Total Protein: 7.6 g/dL (ref 6.4–8.3)

## 2015-01-23 LAB — CBC WITH DIFFERENTIAL/PLATELET
BASO%: 1.4 % (ref 0.0–2.0)
Basophils Absolute: 0.2 10*3/uL — ABNORMAL HIGH (ref 0.0–0.1)
EOS ABS: 0.1 10*3/uL (ref 0.0–0.5)
EOS%: 0.5 % (ref 0.0–7.0)
HCT: 33 % — ABNORMAL LOW (ref 38.4–49.9)
HGB: 11.1 g/dL — ABNORMAL LOW (ref 13.0–17.1)
LYMPH#: 1.1 10*3/uL (ref 0.9–3.3)
LYMPH%: 8.2 % — AB (ref 14.0–49.0)
MCH: 30.9 pg (ref 27.2–33.4)
MCHC: 33.5 g/dL (ref 32.0–36.0)
MCV: 92.3 fL (ref 79.3–98.0)
MONO#: 1.9 10*3/uL — AB (ref 0.1–0.9)
MONO%: 14.7 % — ABNORMAL HIGH (ref 0.0–14.0)
NEUT#: 9.7 10*3/uL — ABNORMAL HIGH (ref 1.5–6.5)
NEUT%: 75.2 % — ABNORMAL HIGH (ref 39.0–75.0)
Platelets: 559 10*3/uL — ABNORMAL HIGH (ref 140–400)
RBC: 3.58 10*6/uL — ABNORMAL LOW (ref 4.20–5.82)
RDW: 13.9 % (ref 11.0–14.6)
WBC: 12.9 10*3/uL — AB (ref 4.0–10.3)

## 2015-01-23 NOTE — Telephone Encounter (Signed)
Gave adn printed appt sched and avs for pt for July and AUg
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
## 2015-01-24 ENCOUNTER — Other Ambulatory Visit: Payer: Self-pay | Admitting: Medical Oncology

## 2015-01-24 ENCOUNTER — Telehealth: Payer: Self-pay | Admitting: Internal Medicine

## 2015-01-24 ENCOUNTER — Other Ambulatory Visit: Payer: Self-pay | Admitting: Cardiothoracic Surgery

## 2015-01-24 DIAGNOSIS — C3491 Malignant neoplasm of unspecified part of right bronchus or lung: Secondary | ICD-10-CM

## 2015-01-24 NOTE — Telephone Encounter (Signed)
lvm for pt regarding to 7.4 inj cx due to no tx given.Marland KitchenMarland Kitchen

## 2015-01-25 ENCOUNTER — Ambulatory Visit (INDEPENDENT_AMBULATORY_CARE_PROVIDER_SITE_OTHER): Payer: Self-pay | Admitting: Cardiothoracic Surgery

## 2015-01-25 ENCOUNTER — Other Ambulatory Visit: Payer: Medicare Other

## 2015-01-25 ENCOUNTER — Encounter: Payer: Self-pay | Admitting: Cardiothoracic Surgery

## 2015-01-25 ENCOUNTER — Ambulatory Visit
Admission: RE | Admit: 2015-01-25 | Discharge: 2015-01-25 | Disposition: A | Discharge status: Home / Self Care | Payer: Medicare Other | Source: Ambulatory Visit | Attending: Cardiothoracic Surgery | Admitting: Cardiothoracic Surgery

## 2015-01-25 ENCOUNTER — Ambulatory Visit: Payer: Medicare Other

## 2015-01-25 VITALS — BP 121/74 | HR 107 | Temp 97.5°F | Resp 18 | Ht 65.0 in | Wt 189.0 lb

## 2015-01-25 DIAGNOSIS — Z902 Acquired absence of lung [part of]: Secondary | ICD-10-CM

## 2015-01-25 DIAGNOSIS — C3491 Malignant neoplasm of unspecified part of right bronchus or lung: Secondary | ICD-10-CM

## 2015-01-25 DIAGNOSIS — C3411 Malignant neoplasm of upper lobe, right bronchus or lung: Secondary | ICD-10-CM

## 2015-01-25 DIAGNOSIS — J948 Other specified pleural conditions: Secondary | ICD-10-CM

## 2015-01-25 DIAGNOSIS — Z9889 Other specified postprocedural states: Secondary | ICD-10-CM
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
## 2015-01-30 ENCOUNTER — Other Ambulatory Visit: Payer: Medicare Other

## 2015-01-30 ENCOUNTER — Other Ambulatory Visit: Payer: Self-pay | Admitting: Medical Oncology

## 2015-01-30 DIAGNOSIS — R112 Nausea with vomiting, unspecified: Secondary | ICD-10-CM

## 2015-01-30 MED ORDER — PROCHLORPERAZINE MALEATE 10 MG PO TABS: 10.0000 mg | ORAL_TABLET | Freq: Four times a day (QID) | ORAL | Status: DC | PRN

## 2015-02-06 ENCOUNTER — Ambulatory Visit (HOSPITAL_BASED_OUTPATIENT_CLINIC_OR_DEPARTMENT_OTHER): Payer: Medicare Other | Admitting: Physician Assistant

## 2015-02-06 ENCOUNTER — Other Ambulatory Visit: Payer: Medicare Other

## 2015-02-06 ENCOUNTER — Other Ambulatory Visit: Payer: Self-pay | Admitting: Medical Oncology

## 2015-02-06 ENCOUNTER — Ambulatory Visit: Payer: Medicare Other

## 2015-02-06 ENCOUNTER — Encounter: Payer: Self-pay | Admitting: Physician Assistant

## 2015-02-06 ENCOUNTER — Other Ambulatory Visit (HOSPITAL_BASED_OUTPATIENT_CLINIC_OR_DEPARTMENT_OTHER): Payer: Medicare Other

## 2015-02-06 ENCOUNTER — Telehealth: Payer: Self-pay | Admitting: Physician Assistant

## 2015-02-06 VITALS — BP 117/72 | HR 111 | Temp 98.4°F | Resp 19 | Ht 65.0 in | Wt 182.0 lb

## 2015-02-06 DIAGNOSIS — G893 Neoplasm related pain (acute) (chronic): Secondary | ICD-10-CM | POA: Diagnosis not present

## 2015-02-06 DIAGNOSIS — C3491 Malignant neoplasm of unspecified part of right bronchus or lung: Secondary | ICD-10-CM | POA: Diagnosis not present

## 2015-02-06 DIAGNOSIS — J449 Chronic obstructive pulmonary disease, unspecified: Secondary | ICD-10-CM

## 2015-02-06 DIAGNOSIS — R5383 Other fatigue: Secondary | ICD-10-CM

## 2015-02-06 DIAGNOSIS — C3411 Malignant neoplasm of upper lobe, right bronchus or lung: Secondary | ICD-10-CM

## 2015-02-06 DIAGNOSIS — I878 Other specified disorders of veins: Secondary | ICD-10-CM

## 2015-02-06 DIAGNOSIS — Z902 Acquired absence of lung [part of]: Secondary | ICD-10-CM

## 2015-02-06 LAB — COMPREHENSIVE METABOLIC PANEL (CC13)
ALBUMIN: 3.3 g/dL — AB (ref 3.5–5.0)
ALT: 25 U/L (ref 0–55)
AST: 27 U/L (ref 5–34)
Alkaline Phosphatase: 104 U/L (ref 40–150)
Anion Gap: 12 mEq/L — ABNORMAL HIGH (ref 3–11)
BUN: 12.6 mg/dL (ref 7.0–26.0)
CO2: 25 meq/L (ref 22–29)
CREATININE: 0.9 mg/dL (ref 0.7–1.3)
Calcium: 10.3 mg/dL (ref 8.4–10.4)
Chloride: 101 mEq/L (ref 98–109)
EGFR: 89 mL/min/{1.73_m2} — AB (ref >90)
Glucose: 137 mg/dl (ref 70–140)
POTASSIUM: 4.1 meq/L (ref 3.5–5.1)
SODIUM: 138 meq/L (ref 136–145)
Total Bilirubin: 0.52 mg/dL (ref 0.20–1.20)
Total Protein: 8.3 g/dL (ref 6.4–8.3)

## 2015-02-06 LAB — CBC WITH DIFFERENTIAL/PLATELET
BASO%: 0.4 % (ref 0.0–2.0)
BASOS ABS: 0 10*3/uL (ref 0.0–0.1)
EOS ABS: 0.2 10*3/uL (ref 0.0–0.5)
EOS%: 1.8 % (ref 0.0–7.0)
HCT: 41.9 % (ref 38.4–49.9)
HEMOGLOBIN: 13.8 g/dL (ref 13.0–17.1)
LYMPH%: 15.3 % (ref 14.0–49.0)
MCH: 30.9 pg (ref 27.2–33.4)
MCHC: 32.9 g/dL (ref 32.0–36.0)
MCV: 93.9 fL (ref 79.3–98.0)
MONO#: 0.9 10*3/uL (ref 0.1–0.9)
MONO%: 8.4 % (ref 0.0–14.0)
NEUT%: 74.1 % (ref 39.0–75.0)
NEUTROS ABS: 7.7 10*3/uL — AB (ref 1.5–6.5)
PLATELETS: 381 10*3/uL (ref 140–400)
RBC: 4.46 10*6/uL (ref 4.20–5.82)
RDW: 14.4 % (ref 11.0–14.6)
WBC: 10.4 10*3/uL — AB (ref 4.0–10.3)
lymph#: 1.6 10*3/uL (ref 0.9–3.3)
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
## 2015-02-06 NOTE — Progress Notes (Signed)
Patient has poor venous access.  Notified Dr. Worthy Flank nurse today, Delle Reining RN and she will start process. After 5 unsuccessful attempts (2x Jan MyersRN, 2x Jesse Fall RN, 1x Randolm Idol RN ) unable to obtain assess for chemotherapy today.  Discussed with Abelina Bachelor RN with Dr. Julien Nordmann.  She will let him know and then schedule chemotherapy after PAC inserted. Patient taken to ED via wheelchair to see his wife, who was taken there earlier this am by [facility] RN for respiratory distress.

## 2015-02-06 NOTE — Telephone Encounter (Signed)
Pt confirmed labs/ov per 07/26 POF, gave pt AVS and Calendar.... KJ

## 2015-02-06 NOTE — Progress Notes (Signed)
Infusion nurse reports difficult IV access . Pt requests port

## 2015-02-07 NOTE — Patient Instructions (Signed)
Continue weekly labs as scheduled Follow-up in 3 weeks prior to the start of your next scheduled cycle of adjuvant chemotherapy

## 2015-02-08 ENCOUNTER — Telehealth: Payer: Self-pay | Admitting: Medical Oncology

## 2015-02-08 ENCOUNTER — Other Ambulatory Visit: Payer: Self-pay | Admitting: Medical Oncology

## 2015-02-08 ENCOUNTER — Ambulatory Visit: Payer: Medicare Other

## 2015-02-08 NOTE — Telephone Encounter (Signed)
I told pt Dr Julien Nordmann does not want him to have a portacath with only a few more treatments left. I gave him appt for next week.

## 2015-02-13 ENCOUNTER — Ambulatory Visit (HOSPITAL_BASED_OUTPATIENT_CLINIC_OR_DEPARTMENT_OTHER): Payer: Medicare Other

## 2015-02-13 ENCOUNTER — Other Ambulatory Visit (HOSPITAL_BASED_OUTPATIENT_CLINIC_OR_DEPARTMENT_OTHER): Payer: Medicare Other

## 2015-02-13 VITALS — BP 130/59 | HR 115 | Temp 98.0°F | Resp 20

## 2015-02-13 DIAGNOSIS — C3491 Malignant neoplasm of unspecified part of right bronchus or lung: Secondary | ICD-10-CM | POA: Diagnosis not present

## 2015-02-13 DIAGNOSIS — Z5111 Encounter for antineoplastic chemotherapy: Secondary | ICD-10-CM | POA: Diagnosis not present

## 2015-02-13 DIAGNOSIS — C3411 Malignant neoplasm of upper lobe, right bronchus or lung: Secondary | ICD-10-CM | POA: Diagnosis not present

## 2015-02-13 LAB — COMPREHENSIVE METABOLIC PANEL (CC13)
ALT: 25 U/L (ref 0–55)
AST: 26 U/L (ref 5–34)
Albumin: 3.4 g/dL — ABNORMAL LOW (ref 3.5–5.0)
Alkaline Phosphatase: 109 U/L (ref 40–150)
Anion Gap: 9 mEq/L (ref 3–11)
BUN: 12.5 mg/dL (ref 7.0–26.0)
CALCIUM: 10.2 mg/dL (ref 8.4–10.4)
CO2: 26 mEq/L (ref 22–29)
CREATININE: 0.8 mg/dL (ref 0.7–1.3)
Chloride: 100 mEq/L (ref 98–109)
EGFR: >90 mL/min/{1.73_m2} (ref >90)
GLUCOSE: 149 mg/dL — AB (ref 70–140)
POTASSIUM: 3.5 meq/L (ref 3.5–5.1)
SODIUM: 135 meq/L — AB (ref 136–145)
Total Bilirubin: 0.67 mg/dL (ref 0.20–1.20)
Total Protein: 8 g/dL (ref 6.4–8.3)

## 2015-02-13 LAB — CBC WITH DIFFERENTIAL/PLATELET
BASO%: 1.5 % (ref 0.0–2.0)
BASOS ABS: 0.1 10*3/uL (ref 0.0–0.1)
EOS ABS: 0.5 10*3/uL (ref 0.0–0.5)
EOS%: 5.8 % (ref 0.0–7.0)
HCT: 43.6 % (ref 38.4–49.9)
HGB: 14.4 g/dL (ref 13.0–17.1)
LYMPH#: 1.3 10*3/uL (ref 0.9–3.3)
LYMPH%: 13.7 % — AB (ref 14.0–49.0)
MCH: 30.2 pg (ref 27.2–33.4)
MCHC: 33 g/dL (ref 32.0–36.0)
MCV: 91.6 fL (ref 79.3–98.0)
MONO#: 0.9 10*3/uL (ref 0.1–0.9)
MONO%: 9.5 % (ref 0.0–14.0)
NEUT#: 6.5 10*3/uL (ref 1.5–6.5)
NEUT%: 69.5 % (ref 39.0–75.0)
PLATELETS: 330 10*3/uL (ref 140–400)
RBC: 4.76 10*6/uL (ref 4.20–5.82)
RDW: 14.8 % — AB (ref 11.0–14.6)
WBC: 9.3 10*3/uL (ref 4.0–10.3)

## 2015-02-13 MED ORDER — FAMOTIDINE IN NACL 20-0.9 MG/50ML-% IV SOLN
20.0000 mg | Freq: Once | INTRAVENOUS | Status: AC
  Administered 2015-02-13: 20 mg via INTRAVENOUS

## 2015-02-13 MED ORDER — DIPHENHYDRAMINE HCL 25 MG PO CAPS
ORAL_CAPSULE | ORAL | Status: AC
  Filled 2015-02-13: qty 2

## 2015-02-13 MED ORDER — SODIUM CHLORIDE 0.9 % IV SOLN
Freq: Once | INTRAVENOUS | Status: AC
  Administered 2015-02-13: 10:00:00 via INTRAVENOUS
  Filled 2015-02-13: qty 8

## 2015-02-13 MED ORDER — PACLITAXEL CHEMO INJECTION 300 MG/50ML
175.0000 mg/m2 | Freq: Once | INTRAVENOUS | Status: AC
  Administered 2015-02-13: 348 mg via INTRAVENOUS
  Filled 2015-02-13: qty 58

## 2015-02-13 MED ORDER — ACETAMINOPHEN 325 MG PO TABS
ORAL_TABLET | ORAL | Status: AC
  Filled 2015-02-13: qty 2

## 2015-02-13 MED ORDER — SODIUM CHLORIDE 0.9 % IV SOLN
556.5000 mg | Freq: Once | INTRAVENOUS | Status: AC
  Administered 2015-02-13: 560 mg via INTRAVENOUS
  Filled 2015-02-13: qty 56

## 2015-02-13 MED ORDER — DIPHENHYDRAMINE HCL 50 MG/ML IJ SOLN
50.0000 mg | Freq: Once | INTRAMUSCULAR | Status: AC
  Administered 2015-02-13: 50 mg via INTRAVENOUS

## 2015-02-13 MED ORDER — OXYCODONE-ACETAMINOPHEN 5-325 MG PO TABS
2.0000 | ORAL_TABLET | Freq: Once | ORAL | Status: AC
  Administered 2015-02-13: 2 via ORAL

## 2015-02-13 MED ORDER — SODIUM CHLORIDE 0.9 % IV SOLN
Freq: Once | INTRAVENOUS | Status: AC
  Administered 2015-02-13: 10:00:00 via INTRAVENOUS

## 2015-02-13 MED ORDER — DIPHENHYDRAMINE HCL 50 MG/ML IJ SOLN
INTRAMUSCULAR | Status: AC
  Filled 2015-02-13: qty 1

## 2015-02-13 MED ORDER — OXYCODONE-ACETAMINOPHEN 5-325 MG PO TABS
ORAL_TABLET | ORAL | Status: AC
Start: 2015-02-13 — End: 2015-02-13
  Filled 2015-02-13: qty 2

## 2015-02-13 MED ORDER — FAMOTIDINE IN NACL 20-0.9 MG/50ML-% IV SOLN
INTRAVENOUS | Status: AC
  Filled 2015-02-13: qty 50

## 2015-02-13 NOTE — Patient Instructions (Signed)
[facility] Discharge Instructions for Patients Receiving Chemotherapy  Today you received the following chemotherapy agents taxol/carboplatin  To help prevent nausea and vomiting after your treatment, we encourage you to take your nausea medication as directed   If you develop nausea and vomiting that is not controlled by your nausea medication, call the clinic.   BELOW ARE SYMPTOMS THAT SHOULD BE REPORTED IMMEDIATELY:  *FEVER GREATER THAN 100.5 F  *CHILLS WITH OR WITHOUT FEVER  NAUSEA AND VOMITING THAT IS NOT CONTROLLED WITH YOUR NAUSEA MEDICATION  *UNUSUAL SHORTNESS OF BREATH  *UNUSUAL BRUISING OR BLEEDING  TENDERNESS IN MOUTH AND THROAT WITH OR WITHOUT PRESENCE OF ULCERS  *URINARY PROBLEMS  *BOWEL PROBLEMS  UNUSUAL RASH Items with * indicate a potential emergency and should be followed up as soon as possible.  Feel free to call the clinic you have any questions or concerns. The clinic phone number is [phone].  

## 2015-02-14 ENCOUNTER — Ambulatory Visit: Payer: Medicare Other

## 2015-02-15 ENCOUNTER — Ambulatory Visit (HOSPITAL_BASED_OUTPATIENT_CLINIC_OR_DEPARTMENT_OTHER): Payer: Medicare Other

## 2015-02-15 ENCOUNTER — Ambulatory Visit: Payer: Medicare Other

## 2015-02-15 VITALS — BP 125/72 | HR 97 | Temp 98.0°F

## 2015-02-15 DIAGNOSIS — C3411 Malignant neoplasm of upper lobe, right bronchus or lung: Secondary | ICD-10-CM

## 2015-02-15 DIAGNOSIS — C3491 Malignant neoplasm of unspecified part of right bronchus or lung: Secondary | ICD-10-CM

## 2015-02-15 DIAGNOSIS — Z5189 Encounter for other specified aftercare: Secondary | ICD-10-CM

## 2015-02-15 MED ORDER — PEGFILGRASTIM INJECTION 6 MG/0.6ML ~~LOC~~
6.0000 mg | PREFILLED_SYRINGE | Freq: Once | SUBCUTANEOUS | Status: AC
  Administered 2015-02-15: 6 mg via SUBCUTANEOUS
  Filled 2015-02-15: qty 0.6

## 2015-02-15 NOTE — Progress Notes (Signed)
[name] Day states that he is feeling whoozy and upset stomach all the time.  Is using Compazine with some relief, but still is whoozy.  He is drinking all the time and eating some.  No vomiting.  Isn't sleeping, was up the last 2 nights.  I have suggested that he try taking a Benadryl for sleep and or use his Valium that he has already (he doesn't use it regularly).

## 2015-02-20 ENCOUNTER — Other Ambulatory Visit (HOSPITAL_BASED_OUTPATIENT_CLINIC_OR_DEPARTMENT_OTHER): Payer: Medicare Other

## 2015-02-20 DIAGNOSIS — C3491 Malignant neoplasm of unspecified part of right bronchus or lung: Secondary | ICD-10-CM

## 2015-02-20 DIAGNOSIS — C3411 Malignant neoplasm of upper lobe, right bronchus or lung: Secondary | ICD-10-CM | POA: Diagnosis not present

## 2015-02-20 LAB — CBC WITH DIFFERENTIAL/PLATELET
BASO%: 1.2 % (ref 0.0–2.0)
Basophils Absolute: 0.1 10*3/uL (ref 0.0–0.1)
EOS ABS: 0.5 10*3/uL (ref 0.0–0.5)
EOS%: 4.9 % (ref 0.0–7.0)
HEMATOCRIT: 38 % — AB (ref 38.4–49.9)
HGB: 12.4 g/dL — ABNORMAL LOW (ref 13.0–17.1)
LYMPH%: 14.1 % (ref 14.0–49.0)
MCH: 30 pg (ref 27.2–33.4)
MCHC: 32.7 g/dL (ref 32.0–36.0)
MCV: 91.9 fL (ref 79.3–98.0)
MONO#: 1 10*3/uL — ABNORMAL HIGH (ref 0.1–0.9)
MONO%: 8.9 % (ref 0.0–14.0)
NEUT#: 7.9 10*3/uL — ABNORMAL HIGH (ref 1.5–6.5)
NEUT%: 70.9 % (ref 39.0–75.0)
PLATELETS: 245 10*3/uL (ref 140–400)
RBC: 4.14 10*6/uL — ABNORMAL LOW (ref 4.20–5.82)
RDW: 14.4 % (ref 11.0–14.6)
WBC: 11.2 10*3/uL — AB (ref 4.0–10.3)
lymph#: 1.6 10*3/uL (ref 0.9–3.3)

## 2015-02-20 LAB — COMPREHENSIVE METABOLIC PANEL (CC13)
ALT: 20 U/L (ref 0–55)
AST: 19 U/L (ref 5–34)
Albumin: 3.5 g/dL (ref 3.5–5.0)
Alkaline Phosphatase: 114 U/L (ref 40–150)
Anion Gap: 9 mEq/L (ref 3–11)
BUN: 11.7 mg/dL (ref 7.0–26.0)
CO2: 29 mEq/L (ref 22–29)
Calcium: 9.8 mg/dL (ref 8.4–10.4)
Chloride: 100 mEq/L (ref 98–109)
Creatinine: 0.8 mg/dL (ref 0.7–1.3)
EGFR: >90 mL/min/{1.73_m2} (ref >90)
GLUCOSE: 113 mg/dL (ref 70–140)
Potassium: 4.1 mEq/L (ref 3.5–5.1)
Sodium: 138 mEq/L (ref 136–145)
Total Bilirubin: 0.3 mg/dL (ref 0.20–1.20)
Total Protein: 7.1 g/dL (ref 6.4–8.3)

## 2015-02-23 ENCOUNTER — Other Ambulatory Visit: Payer: Self-pay | Admitting: *Deleted

## 2015-02-23 DIAGNOSIS — R112 Nausea with vomiting, unspecified: Secondary | ICD-10-CM

## 2015-02-23 DIAGNOSIS — C3491 Malignant neoplasm of unspecified part of right bronchus or lung: Secondary | ICD-10-CM

## 2015-02-23 MED ORDER — PROCHLORPERAZINE MALEATE 10 MG PO TABS: 10.0000 mg | ORAL_TABLET | Freq: Four times a day (QID) | ORAL | Status: DC | PRN

## 2015-02-23 NOTE — Telephone Encounter (Signed)
Refill on compazine e-scribed per pt request

## 2015-02-26 ENCOUNTER — Other Ambulatory Visit: Payer: Self-pay | Admitting: Cardiothoracic Surgery

## 2015-02-26 ENCOUNTER — Telehealth: Payer: Self-pay | Admitting: Medical Oncology

## 2015-02-26 DIAGNOSIS — Z902 Acquired absence of lung [part of]: Secondary | ICD-10-CM

## 2015-02-26 NOTE — Telephone Encounter (Signed)
I called pt and told him to keep lab and md appt tomorrow and chemo is not due until following week.

## 2015-02-27 ENCOUNTER — Other Ambulatory Visit: Payer: Self-pay | Admitting: Medical Oncology

## 2015-02-27 ENCOUNTER — Encounter: Payer: Self-pay | Admitting: Internal Medicine

## 2015-02-27 ENCOUNTER — Ambulatory Visit (HOSPITAL_BASED_OUTPATIENT_CLINIC_OR_DEPARTMENT_OTHER): Payer: Medicare Other | Admitting: Internal Medicine

## 2015-02-27 ENCOUNTER — Telehealth: Payer: Self-pay | Admitting: Internal Medicine

## 2015-02-27 ENCOUNTER — Ambulatory Visit: Payer: Medicare Other

## 2015-02-27 ENCOUNTER — Other Ambulatory Visit (HOSPITAL_BASED_OUTPATIENT_CLINIC_OR_DEPARTMENT_OTHER): Payer: Medicare Other

## 2015-02-27 ENCOUNTER — Encounter: Payer: Medicare Other | Admitting: Nutrition

## 2015-02-27 VITALS — BP 136/63 | HR 83 | Temp 98.2°F | Resp 18 | Ht 65.0 in | Wt 182.1 lb

## 2015-02-27 DIAGNOSIS — G893 Neoplasm related pain (acute) (chronic): Secondary | ICD-10-CM

## 2015-02-27 DIAGNOSIS — C3491 Malignant neoplasm of unspecified part of right bronchus or lung: Secondary | ICD-10-CM

## 2015-02-27 DIAGNOSIS — C3411 Malignant neoplasm of upper lobe, right bronchus or lung: Secondary | ICD-10-CM

## 2015-02-27 DIAGNOSIS — G620 Drug-induced polyneuropathy: Secondary | ICD-10-CM

## 2015-02-27 LAB — COMPREHENSIVE METABOLIC PANEL (CC13)
ALT: 55 U/L (ref 0–55)
AST: 42 U/L — AB (ref 5–34)
Albumin: 3.9 g/dL (ref 3.5–5.0)
Alkaline Phosphatase: 113 U/L (ref 40–150)
Anion Gap: 11 mEq/L (ref 3–11)
BUN: 13.4 mg/dL (ref 7.0–26.0)
CO2: 26 meq/L (ref 22–29)
CREATININE: 0.9 mg/dL (ref 0.7–1.3)
Calcium: 10.1 mg/dL (ref 8.4–10.4)
Chloride: 101 mEq/L (ref 98–109)
EGFR: 88 mL/min/{1.73_m2} — ABNORMAL LOW (ref >90)
Glucose: 97 mg/dl (ref 70–140)
Potassium: 4.1 mEq/L (ref 3.5–5.1)
SODIUM: 138 meq/L (ref 136–145)
Total Bilirubin: 0.31 mg/dL (ref 0.20–1.20)
Total Protein: 8 g/dL (ref 6.4–8.3)

## 2015-02-27 LAB — CBC WITH DIFFERENTIAL/PLATELET
BASO%: 0.6 % (ref 0.0–2.0)
BASOS ABS: 0.1 10*3/uL (ref 0.0–0.1)
EOS ABS: 0.3 10*3/uL (ref 0.0–0.5)
EOS%: 3.5 % (ref 0.0–7.0)
HCT: 42.8 % (ref 38.4–49.9)
HEMOGLOBIN: 13.9 g/dL (ref 13.0–17.1)
LYMPH%: 22.3 % (ref 14.0–49.0)
MCH: 30.7 pg (ref 27.2–33.4)
MCHC: 32.5 g/dL (ref 32.0–36.0)
MCV: 94.5 fL (ref 79.3–98.0)
MONO#: 0.8 10*3/uL (ref 0.1–0.9)
MONO%: 9.7 % (ref 0.0–14.0)
NEUT#: 5.4 10*3/uL (ref 1.5–6.5)
NEUT%: 63.9 % (ref 39.0–75.0)
PLATELETS: 181 10*3/uL (ref 140–400)
RBC: 4.53 10*6/uL (ref 4.20–5.82)
RDW: 15.2 % — AB (ref 11.0–14.6)
WBC: 8.5 10*3/uL (ref 4.0–10.3)
lymph#: 1.9 10*3/uL (ref 0.9–3.3)

## 2015-02-27 NOTE — Patient Instructions (Signed)
Smoking Cessation, Tips for Success  If you are ready to quit smoking, congratulations! You have chosen to help yourself be healthier. Cigarettes bring nicotine, tar, carbon monoxide, and other irritants into your body. Your lungs, heart, and blood vessels will be able to work better without these poisons. There are many different ways to quit smoking. Nicotine gum, nicotine patches, a nicotine inhaler, or nicotine nasal spray can help with physical craving. Hypnosis, support groups, and medicines help break the habit of smoking.  WHAT THINGS CAN I DO TO MAKE QUITTING EASIER?   Here are some tips to help you quit for good:  · Pick a date when you will quit smoking completely. Tell all of your friends and family about your plan to quit on that date.  · Do not try to slowly cut down on the number of cigarettes you are smoking. Pick a quit date and quit smoking completely starting on that day.  · Throw away all cigarettes.    · Clean and remove all ashtrays from your home, work, and car.  · On a card, write down your reasons for quitting. Carry the card with you and read it when you get the urge to smoke.  · Cleanse your body of nicotine. Drink enough water and fluids to keep your urine clear or pale yellow. Do this after quitting to flush the nicotine from your body.  · Learn to predict your moods. Do not let a bad situation be your excuse to have a cigarette. Some situations in your life might tempt you into wanting a cigarette.  · Never have "just one" cigarette. It leads to wanting another and another. Remind yourself of your decision to quit.  · Change habits associated with smoking. If you smoked while driving or when feeling stressed, try other activities to replace smoking. Stand up when drinking your coffee. Brush your teeth after eating. Sit in a different chair when you read the paper. Avoid alcohol while trying to quit, and try to drink fewer caffeinated beverages. Alcohol and caffeine may urge you to  smoke.  · Avoid foods and drinks that can trigger a desire to smoke, such as sugary or spicy foods and alcohol.  · Ask people who smoke not to smoke around you.  · Have something planned to do right after eating or having a cup of coffee. For example, plan to take a walk or exercise.  · Try a relaxation exercise to calm you down and decrease your stress. Remember, you may be tense and nervous for the first 2 weeks after you quit, but this will pass.  · Find new activities to keep your hands busy. Play with a pen, coin, or rubber band. Doodle or draw things on paper.  · Brush your teeth right after eating. This will help cut down on the craving for the taste of tobacco after meals. You can also try mouthwash.    · Use oral substitutes in place of cigarettes. Try using lemon drops, carrots, cinnamon sticks, or chewing gum. Keep them handy so they are available when you have the urge to smoke.  · When you have the urge to smoke, try deep breathing.  · Designate your home as a nonsmoking area.  · If you are a heavy smoker, ask your health care provider about a prescription for nicotine chewing gum. It can ease your withdrawal from nicotine.  · Reward yourself. Set aside the cigarette money you save and buy yourself something nice.  · Look for   support from others. Join a support group or smoking cessation program. Ask someone at home or at work to help you with your plan to quit smoking.  · Always ask yourself, "Do I need this cigarette or is this just a reflex?" Tell yourself, "Today, I choose not to smoke," or "I do not want to smoke." You are reminding yourself of your decision to quit.  · Do not replace cigarette smoking with electronic cigarettes (commonly called e-cigarettes). The safety of e-cigarettes is unknown, and some may contain harmful chemicals.  · If you relapse, do not give up! Plan ahead and think about what you will do the next time you get the urge to smoke.  HOW WILL I FEEL WHEN I QUIT SMOKING?  You  may have symptoms of withdrawal because your body is used to nicotine (the addictive substance in cigarettes). You may crave cigarettes, be irritable, feel very hungry, cough often, get headaches, or have difficulty concentrating. The withdrawal symptoms are only temporary. They are strongest when you first quit but will go away within 10-14 days. When withdrawal symptoms occur, stay in control. Think about your reasons for quitting. Remind yourself that these are signs that your body is healing and getting used to being without cigarettes. Remember that withdrawal symptoms are easier to treat than the major diseases that smoking can cause.   Even after the withdrawal is over, expect periodic urges to smoke. However, these cravings are generally short lived and will go away whether you smoke or not. Do not smoke!  WHAT RESOURCES ARE AVAILABLE TO HELP ME QUIT SMOKING?  Your health care provider can direct you to community resources or hospitals for support, which may include:  · Group support.  · Education.  · Hypnosis.  · Therapy.  Document Released: 03/28/2004 Document Revised: 11/14/2013 Document Reviewed: 12/16/2012  ExitCare® Patient Information ©2015 [employer]. This information is not intended to replace advice given to you by your health care provider. Make sure you discuss any questions you have with your health care provider.
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
## 2015-02-27 NOTE — Telephone Encounter (Signed)
Gave patient avs report and appointments for August and September. [facility] will call with ct - patient aware.

## 2015-03-01 ENCOUNTER — Ambulatory Visit: Payer: Medicare Other

## 2015-03-01 ENCOUNTER — Ambulatory Visit: Payer: Medicare Other | Admitting: Cardiothoracic Surgery

## 2015-03-02 ENCOUNTER — Ambulatory Visit (INDEPENDENT_AMBULATORY_CARE_PROVIDER_SITE_OTHER): Payer: Medicare Other | Admitting: Cardiothoracic Surgery

## 2015-03-02 ENCOUNTER — Ambulatory Visit
Admission: RE | Admit: 2015-03-02 | Discharge: 2015-03-02 | Disposition: A | Discharge status: Home / Self Care | Payer: Medicare Other | Source: Ambulatory Visit | Attending: Cardiothoracic Surgery | Admitting: Cardiothoracic Surgery

## 2015-03-02 ENCOUNTER — Encounter: Payer: Self-pay | Admitting: Cardiothoracic Surgery

## 2015-03-02 VITALS — BP 132/85 | HR 104 | Resp 18 | Ht 65.0 in | Wt 182.0 lb

## 2015-03-02 DIAGNOSIS — J9589 Other postprocedural complications and disorders of respiratory system, not elsewhere classified: Secondary | ICD-10-CM

## 2015-03-02 DIAGNOSIS — Z902 Acquired absence of lung [part of]: Secondary | ICD-10-CM

## 2015-03-02 DIAGNOSIS — J9382 Other air leak: Secondary | ICD-10-CM | POA: Diagnosis not present

## 2015-03-02 DIAGNOSIS — Z9889 Other specified postprocedural states: Secondary | ICD-10-CM

## 2015-03-02 DIAGNOSIS — C3411 Malignant neoplasm of upper lobe, right bronchus or lung: Secondary | ICD-10-CM

## 2015-03-02 DIAGNOSIS — J95812 Postprocedural air leak: Secondary | ICD-10-CM
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
## 2015-03-06 ENCOUNTER — Ambulatory Visit (HOSPITAL_BASED_OUTPATIENT_CLINIC_OR_DEPARTMENT_OTHER): Payer: Medicare Other

## 2015-03-06 ENCOUNTER — Other Ambulatory Visit (HOSPITAL_BASED_OUTPATIENT_CLINIC_OR_DEPARTMENT_OTHER): Payer: Medicare Other

## 2015-03-06 VITALS — BP 128/69 | HR 97 | Temp 98.0°F | Resp 20

## 2015-03-06 DIAGNOSIS — C3491 Malignant neoplasm of unspecified part of right bronchus or lung: Secondary | ICD-10-CM

## 2015-03-06 DIAGNOSIS — Z5111 Encounter for antineoplastic chemotherapy: Secondary | ICD-10-CM

## 2015-03-06 DIAGNOSIS — C3411 Malignant neoplasm of upper lobe, right bronchus or lung: Secondary | ICD-10-CM | POA: Diagnosis not present

## 2015-03-06 LAB — COMPREHENSIVE METABOLIC PANEL (CC13)
ALBUMIN: 3.8 g/dL (ref 3.5–5.0)
ALK PHOS: 100 U/L (ref 40–150)
ALT: 25 U/L (ref 0–55)
AST: 21 U/L (ref 5–34)
Anion Gap: 12 mEq/L — ABNORMAL HIGH (ref 3–11)
BUN: 12.7 mg/dL (ref 7.0–26.0)
CO2: 24 mEq/L (ref 22–29)
Calcium: 10.3 mg/dL (ref 8.4–10.4)
Chloride: 101 mEq/L (ref 98–109)
Creatinine: 0.8 mg/dL (ref 0.7–1.3)
EGFR: >90 mL/min/{1.73_m2} (ref >90)
GLUCOSE: 134 mg/dL (ref 70–140)
POTASSIUM: 3.9 meq/L (ref 3.5–5.1)
Sodium: 137 mEq/L (ref 136–145)
Total Bilirubin: 0.48 mg/dL (ref 0.20–1.20)
Total Protein: 7.8 g/dL (ref 6.4–8.3)

## 2015-03-06 LAB — CBC WITH DIFFERENTIAL/PLATELET
BASO%: 1.1 % (ref 0.0–2.0)
BASOS ABS: 0.1 10*3/uL (ref 0.0–0.1)
EOS%: 1.1 % (ref 0.0–7.0)
Eosinophils Absolute: 0.1 10*3/uL (ref 0.0–0.5)
HCT: 44.5 % (ref 38.4–49.9)
HEMOGLOBIN: 15 g/dL (ref 13.0–17.1)
LYMPH%: 11.9 % — ABNORMAL LOW (ref 14.0–49.0)
MCH: 31 pg (ref 27.2–33.4)
MCHC: 33.7 g/dL (ref 32.0–36.0)
MCV: 92 fL (ref 79.3–98.0)
MONO#: 1.2 10*3/uL — ABNORMAL HIGH (ref 0.1–0.9)
MONO%: 12.1 % (ref 0.0–14.0)
NEUT%: 73.8 % (ref 39.0–75.0)
NEUTROS ABS: 7.3 10*3/uL — AB (ref 1.5–6.5)
Platelets: 318 10*3/uL (ref 140–400)
RBC: 4.83 10*6/uL (ref 4.20–5.82)
RDW: 15.9 % — AB (ref 11.0–14.6)
WBC: 9.9 10*3/uL (ref 4.0–10.3)
lymph#: 1.2 10*3/uL (ref 0.9–3.3)

## 2015-03-06 MED ORDER — FAMOTIDINE IN NACL 20-0.9 MG/50ML-% IV SOLN
20.0000 mg | Freq: Once | INTRAVENOUS | Status: AC
  Administered 2015-03-06: 20 mg via INTRAVENOUS

## 2015-03-06 MED ORDER — DIPHENHYDRAMINE HCL 50 MG/ML IJ SOLN
INTRAMUSCULAR | Status: AC
  Filled 2015-03-06: qty 1

## 2015-03-06 MED ORDER — CARBOPLATIN CHEMO INJECTION 600 MG/60ML
556.5000 mg | Freq: Once | INTRAVENOUS | Status: AC
  Administered 2015-03-06: 560 mg via INTRAVENOUS
  Filled 2015-03-06: qty 56

## 2015-03-06 MED ORDER — SODIUM CHLORIDE 0.9 % IV SOLN
Freq: Once | INTRAVENOUS | Status: AC
  Administered 2015-03-06: 09:00:00 via INTRAVENOUS
  Filled 2015-03-06: qty 8

## 2015-03-06 MED ORDER — SODIUM CHLORIDE 0.9 % IV SOLN
Freq: Once | INTRAVENOUS | Status: AC
  Administered 2015-03-06: 09:00:00 via INTRAVENOUS

## 2015-03-06 MED ORDER — DIPHENHYDRAMINE HCL 50 MG/ML IJ SOLN
50.0000 mg | Freq: Once | INTRAMUSCULAR | Status: AC
  Administered 2015-03-06: 50 mg via INTRAVENOUS

## 2015-03-06 MED ORDER — PACLITAXEL CHEMO INJECTION 300 MG/50ML
175.0000 mg/m2 | Freq: Once | INTRAVENOUS | Status: AC
  Administered 2015-03-06: 348 mg via INTRAVENOUS
  Filled 2015-03-06: qty 58

## 2015-03-06 MED ORDER — FAMOTIDINE IN NACL 20-0.9 MG/50ML-% IV SOLN
INTRAVENOUS | Status: AC
  Filled 2015-03-06: qty 50

## 2015-03-06 NOTE — Patient Instructions (Signed)
[facility] Discharge Instructions for Patients Receiving Chemotherapy  Today you received the following chemotherapy agents: Taxol and carboplatin.  To help prevent nausea and vomiting after your treatment, we encourage you to take your nausea medication: Compazine 10 mg every 6 hours as needed.   If you develop nausea and vomiting that is not controlled by your nausea medication, call the clinic.   BELOW ARE SYMPTOMS THAT SHOULD BE REPORTED IMMEDIATELY:  *FEVER GREATER THAN 100.5 F  *CHILLS WITH OR WITHOUT FEVER  NAUSEA AND VOMITING THAT IS NOT CONTROLLED WITH YOUR NAUSEA MEDICATION  *UNUSUAL SHORTNESS OF BREATH  *UNUSUAL BRUISING OR BLEEDING  TENDERNESS IN MOUTH AND THROAT WITH OR WITHOUT PRESENCE OF ULCERS  *URINARY PROBLEMS  *BOWEL PROBLEMS  UNUSUAL RASH Items with * indicate a potential emergency and should be followed up as soon as possible.  Feel free to call the clinic you have any questions or concerns. The clinic phone number is [phone].  Please show the [facility] at check-in to the Emergency Department and triage nurse.

## 2015-03-07 ENCOUNTER — Telehealth: Payer: Self-pay | Admitting: *Deleted

## 2015-03-07 NOTE — Telephone Encounter (Signed)
Woman left message that [name] Day had vomited and he was really uncomfortable and didn't sleep. Called back and spoke with patient.  He vomited one time and now feels better.  He took a compazine right after he vomited.  He did not vomit much, mostly looked like phlegm.   He has applesauce and a "bunch of water" - 3-4 glasses today.  Offered to call him in a different nausea medication, however he declined and  says he thinks he will be fine.  He verbalized that he knows to call us if his nausea medication is not working or he is unable to eat or drink.  He promised to call us if that happens.

## 2015-03-08 ENCOUNTER — Ambulatory Visit (HOSPITAL_BASED_OUTPATIENT_CLINIC_OR_DEPARTMENT_OTHER): Payer: Medicare Other

## 2015-03-08 VITALS — BP 133/75 | HR 101 | Temp 97.6°F

## 2015-03-08 DIAGNOSIS — Z5189 Encounter for other specified aftercare: Secondary | ICD-10-CM | POA: Diagnosis not present

## 2015-03-08 DIAGNOSIS — C3491 Malignant neoplasm of unspecified part of right bronchus or lung: Secondary | ICD-10-CM

## 2015-03-08 DIAGNOSIS — C3411 Malignant neoplasm of upper lobe, right bronchus or lung: Secondary | ICD-10-CM

## 2015-03-08 MED ORDER — PEGFILGRASTIM INJECTION 6 MG/0.6ML ~~LOC~~
6.0000 mg | PREFILLED_SYRINGE | Freq: Once | SUBCUTANEOUS | Status: AC
  Administered 2015-03-08: 6 mg via SUBCUTANEOUS
  Filled 2015-03-08: qty 0.6

## 2015-03-13 ENCOUNTER — Other Ambulatory Visit (HOSPITAL_BASED_OUTPATIENT_CLINIC_OR_DEPARTMENT_OTHER): Payer: Medicare Other

## 2015-03-13 DIAGNOSIS — C3411 Malignant neoplasm of upper lobe, right bronchus or lung: Secondary | ICD-10-CM | POA: Diagnosis not present

## 2015-03-13 DIAGNOSIS — C3491 Malignant neoplasm of unspecified part of right bronchus or lung: Secondary | ICD-10-CM

## 2015-03-13 LAB — CBC WITH DIFFERENTIAL/PLATELET
BASO%: 0.3 % (ref 0.0–2.0)
BASOS ABS: 0 10*3/uL (ref 0.0–0.1)
EOS ABS: 0.3 10*3/uL (ref 0.0–0.5)
EOS%: 2.3 % (ref 0.0–7.0)
HEMATOCRIT: 45.1 % (ref 38.4–49.9)
HEMOGLOBIN: 15.1 g/dL (ref 13.0–17.1)
LYMPH#: 1.8 10*3/uL (ref 0.9–3.3)
LYMPH%: 15.4 % (ref 14.0–49.0)
MCH: 31.1 pg (ref 27.2–33.4)
MCHC: 33.5 g/dL (ref 32.0–36.0)
MCV: 92.8 fL (ref 79.3–98.0)
MONO#: 1.1 10*3/uL — ABNORMAL HIGH (ref 0.1–0.9)
MONO%: 9.8 % (ref 0.0–14.0)
NEUT#: 8.3 10*3/uL — ABNORMAL HIGH (ref 1.5–6.5)
NEUT%: 72.2 % (ref 39.0–75.0)
PLATELETS: 217 10*3/uL (ref 140–400)
RBC: 4.86 10*6/uL (ref 4.20–5.82)
RDW: 15.6 % — AB (ref 11.0–14.6)
WBC: 11.5 10*3/uL — ABNORMAL HIGH (ref 4.0–10.3)

## 2015-03-13 LAB — COMPREHENSIVE METABOLIC PANEL (CC13)
ALBUMIN: 4 g/dL (ref 3.5–5.0)
ALK PHOS: 137 U/L (ref 40–150)
ALT: 15 U/L (ref 0–55)
AST: 17 U/L (ref 5–34)
Anion Gap: 10 mEq/L (ref 3–11)
BUN: 14.1 mg/dL (ref 7.0–26.0)
CALCIUM: 10.4 mg/dL (ref 8.4–10.4)
CO2: 24 mEq/L (ref 22–29)
Chloride: 101 mEq/L (ref 98–109)
Creatinine: 0.7 mg/dL (ref 0.7–1.3)
Glucose: 81 mg/dl (ref 70–140)
POTASSIUM: 4.1 meq/L (ref 3.5–5.1)
Sodium: 135 mEq/L — ABNORMAL LOW (ref 136–145)
Total Bilirubin: 0.71 mg/dL (ref 0.20–1.20)
Total Protein: 8.1 g/dL (ref 6.4–8.3)

## 2015-03-20 ENCOUNTER — Other Ambulatory Visit (HOSPITAL_BASED_OUTPATIENT_CLINIC_OR_DEPARTMENT_OTHER): Payer: Medicare Other

## 2015-03-20 DIAGNOSIS — C3411 Malignant neoplasm of upper lobe, right bronchus or lung: Secondary | ICD-10-CM | POA: Diagnosis not present

## 2015-03-20 DIAGNOSIS — C3491 Malignant neoplasm of unspecified part of right bronchus or lung: Secondary | ICD-10-CM

## 2015-03-20 LAB — CBC WITH DIFFERENTIAL/PLATELET
BASO%: 0.7 % (ref 0.0–2.0)
Basophils Absolute: 0.1 10*3/uL (ref 0.0–0.1)
EOS ABS: 0.1 10*3/uL (ref 0.0–0.5)
EOS%: 0.5 % (ref 0.0–7.0)
HEMATOCRIT: 38.4 % (ref 38.4–49.9)
HGB: 12.7 g/dL — ABNORMAL LOW (ref 13.0–17.1)
LYMPH#: 0.9 10*3/uL (ref 0.9–3.3)
LYMPH%: 6.1 % — ABNORMAL LOW (ref 14.0–49.0)
MCH: 30.6 pg (ref 27.2–33.4)
MCHC: 33.1 g/dL (ref 32.0–36.0)
MCV: 92.3 fL (ref 79.3–98.0)
MONO#: 0.9 10*3/uL (ref 0.1–0.9)
MONO%: 6.3 % (ref 0.0–14.0)
NEUT%: 86.4 % — ABNORMAL HIGH (ref 39.0–75.0)
NEUTROS ABS: 12.4 10*3/uL — AB (ref 1.5–6.5)
PLATELETS: 214 10*3/uL (ref 140–400)
RBC: 4.16 10*6/uL — ABNORMAL LOW (ref 4.20–5.82)
RDW: 16.7 % — ABNORMAL HIGH (ref 11.0–14.6)
WBC: 14.4 10*3/uL — AB (ref 4.0–10.3)

## 2015-03-20 LAB — COMPREHENSIVE METABOLIC PANEL (CC13)
ALBUMIN: 3.5 g/dL (ref 3.5–5.0)
ALK PHOS: 102 U/L (ref 40–150)
ALT: 14 U/L (ref 0–55)
ANION GAP: 9 meq/L (ref 3–11)
AST: 19 U/L (ref 5–34)
BILIRUBIN TOTAL: 0.57 mg/dL (ref 0.20–1.20)
BUN: 9.5 mg/dL (ref 7.0–26.0)
CALCIUM: 9.8 mg/dL (ref 8.4–10.4)
CO2: 30 mEq/L — ABNORMAL HIGH (ref 22–29)
CREATININE: 0.8 mg/dL (ref 0.7–1.3)
Chloride: 97 mEq/L — ABNORMAL LOW (ref 98–109)
EGFR: >90 mL/min/{1.73_m2} (ref >90)
Glucose: 198 mg/dl — ABNORMAL HIGH (ref 70–140)
Potassium: 3.7 mEq/L (ref 3.5–5.1)
Sodium: 135 mEq/L — ABNORMAL LOW (ref 136–145)
TOTAL PROTEIN: 7.1 g/dL (ref 6.4–8.3)

## 2015-03-22 ENCOUNTER — Other Ambulatory Visit: Payer: Self-pay | Admitting: Medical Oncology

## 2015-03-22 DIAGNOSIS — R112 Nausea with vomiting, unspecified: Secondary | ICD-10-CM

## 2015-03-22 MED ORDER — PROCHLORPERAZINE MALEATE 10 MG PO TABS: 10.0000 mg | ORAL_TABLET | Freq: Four times a day (QID) | ORAL | Status: DC | PRN

## 2015-03-27 ENCOUNTER — Ambulatory Visit (INDEPENDENT_AMBULATORY_CARE_PROVIDER_SITE_OTHER): Payer: Medicare Other | Admitting: Internal Medicine

## 2015-03-27 ENCOUNTER — Other Ambulatory Visit (HOSPITAL_BASED_OUTPATIENT_CLINIC_OR_DEPARTMENT_OTHER): Payer: Medicare Other

## 2015-03-27 ENCOUNTER — Encounter: Payer: Self-pay | Admitting: Internal Medicine

## 2015-03-27 VITALS — BP 122/72 | HR 104 | Ht 65.0 in | Wt 184.0 lb

## 2015-03-27 DIAGNOSIS — C3491 Malignant neoplasm of unspecified part of right bronchus or lung: Secondary | ICD-10-CM

## 2015-03-27 DIAGNOSIS — J181 Lobar pneumonia, unspecified organism: Secondary | ICD-10-CM | POA: Diagnosis not present

## 2015-03-27 DIAGNOSIS — C3411 Malignant neoplasm of upper lobe, right bronchus or lung: Secondary | ICD-10-CM

## 2015-03-27 DIAGNOSIS — J449 Chronic obstructive pulmonary disease, unspecified: Secondary | ICD-10-CM

## 2015-03-27 LAB — COMPREHENSIVE METABOLIC PANEL (CC13)
ALBUMIN: 3.3 g/dL — AB (ref 3.5–5.0)
ALK PHOS: 86 U/L (ref 40–150)
ALT: 83 U/L — ABNORMAL HIGH (ref 0–55)
ANION GAP: 12 meq/L — AB (ref 3–11)
AST: 44 U/L — ABNORMAL HIGH (ref 5–34)
BILIRUBIN TOTAL: 0.42 mg/dL (ref 0.20–1.20)
BUN: 18.1 mg/dL (ref 7.0–26.0)
CO2: 26 mEq/L (ref 22–29)
Calcium: 9.6 mg/dL (ref 8.4–10.4)
Chloride: 98 mEq/L (ref 98–109)
Creatinine: 1 mg/dL (ref 0.7–1.3)
EGFR: 75 mL/min/{1.73_m2} — AB (ref >90)
GLUCOSE: 333 mg/dL — AB (ref 70–140)
POTASSIUM: 4.8 meq/L (ref 3.5–5.1)
SODIUM: 135 meq/L — AB (ref 136–145)
TOTAL PROTEIN: 7.2 g/dL (ref 6.4–8.3)

## 2015-03-27 LAB — CBC WITH DIFFERENTIAL/PLATELET
BASO%: 0.5 % (ref 0.0–2.0)
BASOS ABS: 0 10*3/uL (ref 0.0–0.1)
EOS ABS: 0 10*3/uL (ref 0.0–0.5)
EOS%: 0.1 % (ref 0.0–7.0)
HCT: 42.2 % (ref 38.4–49.9)
HEMOGLOBIN: 13.9 g/dL (ref 13.0–17.1)
LYMPH%: 2.7 % — AB (ref 14.0–49.0)
MCH: 30.6 pg (ref 27.2–33.4)
MCHC: 32.8 g/dL (ref 32.0–36.0)
MCV: 93.2 fL (ref 79.3–98.0)
MONO#: 0.3 10*3/uL (ref 0.1–0.9)
MONO%: 2.9 % (ref 0.0–14.0)
NEUT%: 93.8 % — ABNORMAL HIGH (ref 39.0–75.0)
NEUTROS ABS: 9.3 10*3/uL — AB (ref 1.5–6.5)
PLATELETS: 229 10*3/uL (ref 140–400)
RBC: 4.53 10*6/uL (ref 4.20–5.82)
RDW: 17.7 % — ABNORMAL HIGH (ref 11.0–14.6)
WBC: 10 10*3/uL (ref 4.0–10.3)
lymph#: 0.3 10*3/uL — ABNORMAL LOW (ref 0.9–3.3)

## 2015-03-27 MED ORDER — DIAZEPAM 5 MG PO TABS: 5.0000 mg | ORAL_TABLET | Freq: Two times a day (BID) | ORAL | Status: DC | PRN

## 2015-03-27 MED ORDER — BUDESONIDE-FORMOTEROL FUMARATE 160-4.5 MCG/ACT IN AERO: INHALATION_SPRAY | RESPIRATORY_TRACT | Status: DC

## 2015-03-27 NOTE — Patient Instructions (Addendum)
Stop advair and pulmocort  Plan A = automatic = Start symbicort 160 Take 2 puffs first thing in am and then another 2 puffs about 12 hours later.    Plan B = backup - Only use your albuterol (proair)as a rescue medication to be used if you can't catch your breath by resting or doing a relaxed purse lip breathing pattern.  - The less you use it, the better it will work when you need it. - Ok to use up to 2 puffs  every 4 hours if you must but call for immediate appointment if use goes up over your usual need - Don't leave home without it !!  (think of it like the spare tire for your car)   Plan C = crisis Only use your nebulizer if you try Plan B first and it doesn't work   Congratulations on not smoking, it's the most important aspect of your care  For nerves use valium 5 mg up to twice daily as needed  Keep appt to see Dr Halford Chessman - will need f/u cxr then

## 2015-03-27 NOTE — Progress Notes (Signed)
Subjective:    Patient ID: [name] Day, male    DOB: January 02, 1946, 69 y.o.   MRN: [id number]   Brief patient profile:  69 year old male smoker seen for initial pulmonary consult in March 2016 for lung mass/ followed by [name] Day/ [name] Day/ [name] Day  Test Tests: CT chest 09/14/14 >> atherosclerosis, 10 mm Rt paratracheal LAN, 2.6 cm low attenuation in liver no change from 2014, 3.1 x 2.9 cm mass RUL, 5 mm nodule lingula PET scan positive. Hypermetabolic right upper lobe cavitary mass, positive. Hypermetabolic left upper lobe cavitary lesion. No hypermetabolic mediastinal adenopathy. 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 PFT 11/15/14 >> FEV1 2.12 (78%), FEV1% 69, TLC 4.65 (77%), DLCO 82%, no BD   11/15/2014 NP Follow up : lung mass >squamous cell , COPD-smoker  Patient returns for a follow-up. Patient underwent a CT-guided biopsy of right upper lobe lung mass with pathology returning positive for squamous cell carcinoma  PET scan positive. Hypermetabolic right upper lobe cavitary mass, positive. Hypermetabolic left upper lobe cavitary lesion. No hypermetabolic mediastinal adenopathy. He was seen for a surgical consult [name]. Pia Day on May 2. He has been set up for an MRI brain tomorrow. He has a pending cardiology consult for preop clearance. Patient underwent pulmonary function test. This morning that showed an FEV1 at 75%, ratio 67, FVC at 83%, no significant bronchodilator response Mid flows were decreased at 54%, Diffusing capacity was 82% Patient uses Advair as needed. He also uses Ventolin and reports that he uses several times a day. We discussed using his maintenance inhaler, Advair, on a consistent regular dosing schedule. And to use Ventolin, rescue inhaler, for emergency use only. We discussed smoking cessation. rec Take Advair 1 puff Twice daily  , rinse after use.  Only use Ventolin  Inhaler as needed -this is your  rescue inhaler Continue to work on not smoking.    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 SURGICAL PROCEDURE: Bronchoscopy, right video-assisted thoracoscopy, right mini thoracotomy, right upper lobectomy with lymph node dissection, and placement of On-Q device.  Last onc  note 02/27/15 DIAGNOSIS: Stage IB (T2a, N0, M0) non-small cell lung cancer, squamous cell carcinoma diagnosed in May 2016. The patient also has suspicious stage IA lung cancer in the left lower lobe.  PRIOR THERAPY: Status post right upper lobectomy with lymph node dissection in May 2016 under the care of [name] Day  CURRENT THERAPY: Adjuvant systemic chemotherapy with carboplatin for AUC of 5 and paclitaxel 175 MG/M2 every 3 weeks. Status post 2 cycles  .  03/27/2015 extended post hosp f/u  ov/[name] Day re: copd GOLD II quit smoking 03/21/15 and d/c 03/24/15 on advair 100 /saba / confused with instructions Chief Complaint  Patient presents with  . Hospitalization Follow-up    [facility] at [name] from 03/[facility]/15-03/24/15 for SOB. Pt c/o prod cough with clear thick mucus and SOB with any activity.      Main issue is nerves partly related to sob = Can't do HT walking anymore = MMRC3 = can't walk 100 yards even at a slow pace at a flat grade s stopping due to sob . No purulent sputum.  No obvious day to day or daytime variability or assoc  cp or chest tightness, subjective wheeze or overt sinus or hb symptoms. No unusual exp hx or h/o childhood pna/ asthma or knowledge of premature birth.  Sleeping poorly due to nerves but  without nocturnal  or early am exacerbation  of respiratory  c/o's or need for noct saba. Also denies any obvious fluctuation of symptoms with weather or environmental changes or other aggravating or alleviating factors except as outlined above   Current Medications,  Allergies, Complete Past Medical History, Past Surgical History, Family History, and Social History were reviewed in [employer] record.  ROS  The following are not active complaints unless bolded sore throat, dysphagia, dental problems, itching, sneezing,  nasal congestion or excess/ purulent secretions, ear ache,   fever, chills, sweats, unintended wt loss, classically pleuritic or exertional cp, hemoptysis,  orthopnea pnd or leg swelling, presyncope, palpitations, abdominal pain, anorexia, nausea, vomiting, diarrhea  or change in bowel or bladder habits, change in stools or urine, dysuria,hematuria,  rash, arthralgias, visual complaints, headache, numbness, weakness or ataxia or problems with walking or coordination,  change in mood/affect or memory.          Objective:   Physical Exam   GEN: A/Ox3; pleasant , NAD, elderly wm very raspy voice/ dry cough    Wt Readings from Last 3 Encounters:  03/27/15 184 lb (83.462 kg)  03/02/15 182 lb (82.555 kg)  02/27/15 182 lb 1.6 oz (82.6 kg)    Vital signs reviewed   HEENT:  [place]/AT,  EACs-clear, TMs-wnl, NOSE-clear, THROAT-clear, no lesions, no postnasal drip or exudate noted. , poor dentition / top dentures   NECK:  Supple w/ fair ROM; no JVD; normal carotid impulses w/o bruits; no thyromegaly or nodules palpated; no lymphadenopathy.  RESP  insp and exp rhonchi and pseudowheeze which improve with purse lip maneuver   CARD:  RRR, no m/r/g  , no peripheral edema, pulses intact, no cyanosis or clubbing.  GI:   Soft & nt; nml bowel sounds; no organomegaly or masses detected.  Musco: Warm bil, no deformities or joint swelling noted.   Neuro: alert, no focal deficits noted.    Skin: Warm, no lesions or rashes     CXR report 03/23/15  Sup segment LL pna/ chronic small R apical ptx        Assessment & Plan:

## 2015-03-28 DIAGNOSIS — J181 Lobar pneumonia, unspecified organism: Secondary | ICD-10-CM | POA: Insufficient documentation

## 2015-03-28 NOTE — Assessment & Plan Note (Addendum)
PFT 11/15/2014 FEV1 at 75%, ratio 67, FVC at 83%, no significant bronchodilator response  Mid flows were decreased at 54%,  Diffusing capacity was 82% - 03/27/2015 p extensive coaching HFA effectiveness =    90% > try symbicort 160   Symptoms are markedly disproportionate to objective findings and not clear this is all a lung problem but pt does appear to have difficult airway management issues. DDX of  difficult airways management all start with A and  include Adherence, Ace Inhibitors, Acid Reflux, Active Sinus Disease, Alpha 1 Antitripsin deficiency, Anxiety masquerading as Airways dz,  ABPA,  allergy(esp in young), Aspiration (esp in elderly), Adverse effects of meds,  Active smokers, A bunch of PE's (a small clot burden can't cause this syndrome unless there is already severe underlying pulm or vascular dz with poor reserve) plus two Bs  = Bronchiectasis and Beta blocker use..and one C= CHF   Adherence is always the initial "prime suspect" and is a multilayered concern that requires a "trust but verify" approach in every patient - starting with knowing how to use medications, especially inhalers, correctly, keeping up with refills and understanding the fundamental difference between maintenance and prns vs those medications only taken for a very short course and then stopped and not refilled.  The proper method of use, as well as anticipated side effects, of a metered-dose inhaler are discussed and demonstrated to the patient. Improved effectiveness after extensive coaching during this visit to a level of approximately  90 % so try symbicort 160 2bid  ? Adverse effects of dpi, esp advair > try off   ? Anxiety > try valium 5 mg bid   ? Active smoking > says no smoking since admit -   I reviewed the Fletcher curve with the patient that basically indicates  if you quit smoking when your best day FEV1 is still well preserved (as is relatively still the case here)  it is highly unlikely you will  progress to severe disease and informed the patient there was no medication on the market that has proven to alter the curve/ its downward trajectory  or the likelihood of progression of their disease.  Therefore stopping smoking and maintaining abstinence is the most important aspect of care, not choice of inhalers or for that matter, doctors.    ? Acid (or non-acid) GERD > always difficult to exclude as up to 75% of pts in some series report no assoc GI/ Heartburn symptoms> if not improving next  rec max (24h)  acid suppression and diet restrictions   I had an extended discussion with the patient reviewing all relevant studies completed to date and  lasting 25  minutes of a 40 minute visit    Each maintenance medication was reviewed in detail including most importantly the difference between maintenance and prns and under what circumstances the prns are to be triggered using an action plan format that is not reflected in the computer generated alphabetically organized AVS.    Please see instructions for details which were reviewed in writing and the patient given a copy highlighting the part that I personally wrote and discussed at today's ov.   Marland Kitchen

## 2015-03-28 NOTE — Assessment & Plan Note (Signed)
Dx [facility] 03/21/15  No purulent sputum, fever/chest pain so likely adequately treated already > needs f/u cxr on return ov with Dr Halford Chessman and will call in meantime if condition worsens

## 2015-03-29 ENCOUNTER — Ambulatory Visit (HOSPITAL_BASED_OUTPATIENT_CLINIC_OR_DEPARTMENT_OTHER): Payer: Medicare Other | Admitting: Internal Medicine

## 2015-03-29 ENCOUNTER — Encounter: Payer: Self-pay | Admitting: Internal Medicine

## 2015-03-29 VITALS — BP 146/85 | HR 100 | Temp 98.2°F | Resp 18 | Ht 65.0 in | Wt 179.3 lb

## 2015-03-29 DIAGNOSIS — J449 Chronic obstructive pulmonary disease, unspecified: Secondary | ICD-10-CM | POA: Diagnosis not present

## 2015-03-29 DIAGNOSIS — C3491 Malignant neoplasm of unspecified part of right bronchus or lung: Secondary | ICD-10-CM
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
## 2015-03-30 ENCOUNTER — Ambulatory Visit (HOSPITAL_COMMUNITY)
Admission: RE | Admit: 2015-03-30 | Discharge: 2015-03-30 | Disposition: A | Discharge status: Home / Self Care | Payer: Medicare Other | Source: Ambulatory Visit | Attending: Internal Medicine | Admitting: Internal Medicine

## 2015-03-30 DIAGNOSIS — I2510 Atherosclerotic heart disease of native coronary artery without angina pectoris: Secondary | ICD-10-CM | POA: Insufficient documentation

## 2015-03-30 DIAGNOSIS — J939 Pneumothorax, unspecified: Secondary | ICD-10-CM | POA: Insufficient documentation

## 2015-03-30 DIAGNOSIS — R911 Solitary pulmonary nodule: Secondary | ICD-10-CM | POA: Insufficient documentation

## 2015-03-30 DIAGNOSIS — K769 Liver disease, unspecified: Secondary | ICD-10-CM | POA: Insufficient documentation

## 2015-03-30 DIAGNOSIS — C3491 Malignant neoplasm of unspecified part of right bronchus or lung: Secondary | ICD-10-CM | POA: Diagnosis present

## 2015-03-30 DIAGNOSIS — J432 Centrilobular emphysema: Secondary | ICD-10-CM | POA: Diagnosis not present

## 2015-03-30 DIAGNOSIS — I700 Atherosclerosis of aorta: Secondary | ICD-10-CM | POA: Diagnosis not present

## 2015-03-30 DIAGNOSIS — G893 Neoplasm related pain (acute) (chronic): Secondary | ICD-10-CM

## 2015-03-30 DIAGNOSIS — Z902 Acquired absence of lung [part of]: Secondary | ICD-10-CM | POA: Diagnosis not present

## 2015-03-30 MED ORDER — IOHEXOL 300 MG/ML  SOLN
75.0000 mL | Freq: Once | INTRAMUSCULAR | Status: AC | PRN
  Administered 2015-03-30: 75 mL via INTRAVENOUS

## 2015-04-11 ENCOUNTER — Other Ambulatory Visit: Payer: Self-pay | Admitting: Cardiothoracic Surgery

## 2015-04-11 DIAGNOSIS — C3491 Malignant neoplasm of unspecified part of right bronchus or lung: Secondary | ICD-10-CM

## 2015-04-12 ENCOUNTER — Encounter: Payer: Self-pay | Admitting: Cardiothoracic Surgery

## 2015-04-12 ENCOUNTER — Ambulatory Visit (INDEPENDENT_AMBULATORY_CARE_PROVIDER_SITE_OTHER): Payer: Medicare Other | Admitting: Cardiothoracic Surgery

## 2015-04-12 ENCOUNTER — Ambulatory Visit
Admission: RE | Admit: 2015-04-12 | Discharge: 2015-04-12 | Disposition: A | Discharge status: Home / Self Care | Payer: Medicare Other | Source: Ambulatory Visit | Attending: Cardiothoracic Surgery | Admitting: Cardiothoracic Surgery

## 2015-04-12 VITALS — BP 150/90 | HR 100 | Resp 20 | Ht 65.0 in | Wt 179.0 lb

## 2015-04-12 DIAGNOSIS — Z9889 Other specified postprocedural states: Secondary | ICD-10-CM

## 2015-04-12 DIAGNOSIS — Z85118 Personal history of other malignant neoplasm of bronchus and lung: Secondary | ICD-10-CM

## 2015-04-12 DIAGNOSIS — C3491 Malignant neoplasm of unspecified part of right bronchus or lung: Secondary | ICD-10-CM

## 2015-04-12 DIAGNOSIS — C3411 Malignant neoplasm of upper lobe, right bronchus or lung: Secondary | ICD-10-CM | POA: Diagnosis not present

## 2015-04-12 DIAGNOSIS — Z902 Acquired absence of lung [part of]: Secondary | ICD-10-CM
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
## 2015-05-03 ENCOUNTER — Encounter: Payer: Self-pay | Admitting: Pulmonary Disease

## 2015-05-03 ENCOUNTER — Ambulatory Visit (INDEPENDENT_AMBULATORY_CARE_PROVIDER_SITE_OTHER): Payer: Medicare Other | Admitting: Pulmonary Disease

## 2015-05-03 VITALS — BP 124/76 | HR 105 | Ht 65.0 in | Wt 176.6 lb

## 2015-05-03 DIAGNOSIS — Z720 Tobacco use: Secondary | ICD-10-CM

## 2015-05-03 DIAGNOSIS — F17210 Nicotine dependence, cigarettes, uncomplicated: Secondary | ICD-10-CM

## 2015-05-03 DIAGNOSIS — J449 Chronic obstructive pulmonary disease, unspecified: Secondary | ICD-10-CM

## 2015-05-03 DIAGNOSIS — J4489 Other specified chronic obstructive pulmonary disease: Secondary | ICD-10-CM

## 2015-05-03 NOTE — Progress Notes (Signed)
Chief Complaint  Patient presents with  . Follow-up    pt following for lung mass. pt states he is doing pretty well. pt c/o SOB with activity and prod cough on and off clear in color. no c/o chest tightness. no further concerns    History of Present Illness: [name] Day is a 69 y.o. 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  His breathing has been okay.  He is using symbicort bid.  He uses albuterol intermittently during the week and this helps.  He is still smoking a few cigarettes per day >> he gets into trouble with he is around others who smoke.    He denies wheeze, sputum, hemoptysis, or chest pain.  He has numbness in feet and hands after chemo.   TESTS: PFT 11/15/14 >> FEV1 2.12 (78%), FEV1% 69, TLC 4.65 (77%), DLCO 82%, no BD  PMhx >> HTN, HLD, back pain, DM  Past surgical hx, Medications, Allergies, Family hx, Social hx all reviewed.   Physical Exam: BP 124/76 mmHg  Pulse 105  Ht '5\' 5"'$  (1.651 m)  Wt 176 lb 9.6 oz (80.105 kg)  BMI 29.39 kg/m2  SpO2 95%  General - No distress ENT - No sinus tenderness, no oral exudate, no LAN Cardiac - s1s2 regular, no murmur Chest - No wheeze/rales/dullness Back - No focal tenderness Abd - Soft, non-tender Ext - No edema Neuro - Normal strength Skin - No rashes Psych - normal mood, and behavior   Assessment/Plan:  COPD with chronic bronchitis. Plan: - continue symbicort with prn albuterol  Tobacco abuse. Plan: - he will try to stop on his own - discussed other options to assist with smoking cessation  NSCLC. Plan: - f/u with oncology   Chesley Mires, MD [facility] Pager:  [phone]

## 2015-05-03 NOTE — Patient Instructions (Signed)
Follow up in 6 months 

## 2015-05-21 ENCOUNTER — Other Ambulatory Visit: Payer: Self-pay | Admitting: *Deleted

## 2015-05-21 ENCOUNTER — Telehealth: Payer: Self-pay | Admitting: Internal Medicine

## 2015-05-21 DIAGNOSIS — C3491 Malignant neoplasm of unspecified part of right bronchus or lung: Secondary | ICD-10-CM
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
## 2015-06-14 ENCOUNTER — Other Ambulatory Visit: Payer: Self-pay | Admitting: Cardiothoracic Surgery

## 2015-06-14 DIAGNOSIS — C3491 Malignant neoplasm of unspecified part of right bronchus or lung: Secondary | ICD-10-CM

## 2015-06-20 ENCOUNTER — Other Ambulatory Visit: Payer: Medicare Other

## 2015-06-20 ENCOUNTER — Ambulatory Visit: Payer: Medicare Other | Admitting: Cardiothoracic Surgery

## 2015-06-27 ENCOUNTER — Other Ambulatory Visit (HOSPITAL_BASED_OUTPATIENT_CLINIC_OR_DEPARTMENT_OTHER): Payer: Medicare Other

## 2015-06-27 DIAGNOSIS — C3491 Malignant neoplasm of unspecified part of right bronchus or lung: Secondary | ICD-10-CM

## 2015-06-27 LAB — CBC WITH DIFFERENTIAL/PLATELET
BASO%: 1.2 % (ref 0.0–2.0)
BASOS ABS: 0.1 10*3/uL (ref 0.0–0.1)
EOS%: 2.7 % (ref 0.0–7.0)
Eosinophils Absolute: 0.3 10*3/uL (ref 0.0–0.5)
HEMATOCRIT: 50.5 % — AB (ref 38.4–49.9)
HGB: 16.9 g/dL (ref 13.0–17.1)
LYMPH#: 2 10*3/uL (ref 0.9–3.3)
LYMPH%: 18.8 % (ref 14.0–49.0)
MCH: 32.5 pg (ref 27.2–33.4)
MCHC: 33.5 g/dL (ref 32.0–36.0)
MCV: 96.8 fL (ref 79.3–98.0)
MONO#: 1.2 10*3/uL — ABNORMAL HIGH (ref 0.1–0.9)
MONO%: 11.1 % (ref 0.0–14.0)
NEUT#: 6.9 10*3/uL — ABNORMAL HIGH (ref 1.5–6.5)
NEUT%: 66.2 % (ref 39.0–75.0)
PLATELETS: 236 10*3/uL (ref 140–400)
RBC: 5.21 10*6/uL (ref 4.20–5.82)
RDW: 13.3 % (ref 11.0–14.6)
WBC: 10.4 10*3/uL — ABNORMAL HIGH (ref 4.0–10.3)

## 2015-06-27 LAB — COMPREHENSIVE METABOLIC PANEL
ALT: 22 U/L (ref 0–55)
AST: 23 U/L (ref 5–34)
Albumin: 4.1 g/dL (ref 3.5–5.0)
Alkaline Phosphatase: 84 U/L (ref 40–150)
Anion Gap: 11 mEq/L (ref 3–11)
BILIRUBIN TOTAL: 0.59 mg/dL (ref 0.20–1.20)
BUN: 13.3 mg/dL (ref 7.0–26.0)
CO2: 26 meq/L (ref 22–29)
Calcium: 10.6 mg/dL — ABNORMAL HIGH (ref 8.4–10.4)
Chloride: 101 mEq/L (ref 98–109)
Creatinine: 0.9 mg/dL (ref 0.7–1.3)
EGFR: 87 mL/min/{1.73_m2} — AB (ref >90)
GLUCOSE: 106 mg/dL (ref 70–140)
Potassium: 4.3 mEq/L (ref 3.5–5.1)
SODIUM: 139 meq/L (ref 136–145)
TOTAL PROTEIN: 8.3 g/dL (ref 6.4–8.3)

## 2015-06-29 ENCOUNTER — Ambulatory Visit (HOSPITAL_COMMUNITY)
Admission: RE | Admit: 2015-06-29 | Discharge: 2015-06-29 | Disposition: A | Discharge status: Home / Self Care | Payer: Medicare Other | Source: Ambulatory Visit | Attending: Internal Medicine | Admitting: Internal Medicine

## 2015-06-29 ENCOUNTER — Encounter (HOSPITAL_COMMUNITY): Payer: Self-pay

## 2015-06-29 DIAGNOSIS — I2510 Atherosclerotic heart disease of native coronary artery without angina pectoris: Secondary | ICD-10-CM | POA: Insufficient documentation

## 2015-06-29 DIAGNOSIS — Z902 Acquired absence of lung [part of]: Secondary | ICD-10-CM | POA: Insufficient documentation

## 2015-06-29 DIAGNOSIS — J449 Chronic obstructive pulmonary disease, unspecified: Secondary | ICD-10-CM | POA: Diagnosis present

## 2015-06-29 DIAGNOSIS — C3491 Malignant neoplasm of unspecified part of right bronchus or lung: Secondary | ICD-10-CM | POA: Insufficient documentation

## 2015-06-29 MED ORDER — IOHEXOL 300 MG/ML  SOLN
75.0000 mL | Freq: Once | INTRAMUSCULAR | Status: AC | PRN
  Administered 2015-06-29: 75 mL via INTRAVENOUS

## 2015-07-04 ENCOUNTER — Encounter: Payer: Self-pay | Admitting: Internal Medicine

## 2015-07-04 ENCOUNTER — Ambulatory Visit (HOSPITAL_BASED_OUTPATIENT_CLINIC_OR_DEPARTMENT_OTHER): Payer: Medicare Other | Admitting: Internal Medicine

## 2015-07-04 ENCOUNTER — Telehealth: Payer: Self-pay | Admitting: Internal Medicine

## 2015-07-04 VITALS — BP 136/69 | HR 90 | Temp 98.0°F | Resp 18 | Ht 65.0 in | Wt 195.4 lb

## 2015-07-04 DIAGNOSIS — C3491 Malignant neoplasm of unspecified part of right bronchus or lung: Secondary | ICD-10-CM

## 2015-07-04 DIAGNOSIS — C3411 Malignant neoplasm of upper lobe, right bronchus or lung: Secondary | ICD-10-CM | POA: Diagnosis not present

## 2015-07-04 NOTE — Patient Instructions (Signed)
Smoking Cessation, Tips for Success If you are ready to quit smoking, congratulations! You have chosen to help yourself be healthier. Cigarettes bring nicotine, tar, carbon monoxide, and other irritants into your body. Your lungs, heart, and blood vessels will be able to work better without these poisons. There are many different ways to quit smoking. Nicotine gum, nicotine patches, a nicotine inhaler, or nicotine nasal spray can help with physical craving. Hypnosis, support groups, and medicines help break the habit of smoking. WHAT THINGS CAN I DO TO MAKE QUITTING EASIER?  Here are some tips to help you quit for good:  Pick a date when you will quit smoking completely. Tell all of your friends and family about your plan to quit on that date.  Do not try to slowly cut down on the number of cigarettes you are smoking. Pick a quit date and quit smoking completely starting on that day.  Throw away all cigarettes.   Clean and remove all ashtrays from your home, work, and car.  On a card, write down your reasons for quitting. Carry the card with you and read it when you get the urge to smoke.  Cleanse your body of nicotine. Drink enough water and fluids to keep your urine clear or pale yellow. Do this after quitting to flush the nicotine from your body.  Learn to predict your moods. Do not let a bad situation be your excuse to have a cigarette. Some situations in your life might tempt you into wanting a cigarette.  Never have "just one" cigarette. It leads to wanting another and another. Remind yourself of your decision to quit.  Change habits associated with smoking. If you smoked while driving or when feeling stressed, try other activities to replace smoking. Stand up when drinking your coffee. Brush your teeth after eating. Sit in a different chair when you read the paper. Avoid alcohol while trying to quit, and try to drink fewer caffeinated beverages. Alcohol and caffeine may urge you to  smoke.  Avoid foods and drinks that can trigger a desire to smoke, such as sugary or spicy foods and alcohol.  Ask people who smoke not to smoke around you.  Have something planned to do right after eating or having a cup of coffee. For example, plan to take a walk or exercise.  Try a relaxation exercise to calm you down and decrease your stress. Remember, you may be tense and nervous for the first 2 weeks after you quit, but this will pass.  Find new activities to keep your hands busy. Play with a pen, coin, or rubber band. Doodle or draw things on paper.  Brush your teeth right after eating. This will help cut down on the craving for the taste of tobacco after meals. You can also try mouthwash.   Use oral substitutes in place of cigarettes. Try using lemon drops, carrots, cinnamon sticks, or chewing gum. Keep them handy so they are available when you have the urge to smoke.  When you have the urge to smoke, try deep breathing.  Designate your home as a nonsmoking area.  If you are a heavy smoker, ask your health care provider about a prescription for nicotine chewing gum. It can ease your withdrawal from nicotine.  Reward yourself. Set aside the cigarette money you save and buy yourself something nice.  Look for support from others. Join a support group or smoking cessation program. Ask someone at home or at work to help you with your plan   to quit smoking.  Always ask yourself, "Do I need this cigarette or is this just a reflex?" Tell yourself, "Today, I choose not to smoke," or "I do not want to smoke." You are reminding yourself of your decision to quit.  Do not replace cigarette smoking with electronic cigarettes (commonly called e-cigarettes). The safety of e-cigarettes is unknown, and some may contain harmful chemicals.  If you relapse, do not give up! Plan ahead and think about what you will do the next time you get the urge to smoke. HOW WILL I FEEL WHEN I QUIT SMOKING? You  may have symptoms of withdrawal because your body is used to nicotine (the addictive substance in cigarettes). You may crave cigarettes, be irritable, feel very hungry, cough often, get headaches, or have difficulty concentrating. The withdrawal symptoms are only temporary. They are strongest when you first quit but will go away within 10-14 days. When withdrawal symptoms occur, stay in control. Think about your reasons for quitting. Remind yourself that these are signs that your body is healing and getting used to being without cigarettes. Remember that withdrawal symptoms are easier to treat than the major diseases that smoking can cause.  Even after the withdrawal is over, expect periodic urges to smoke. However, these cravings are generally short lived and will go away whether you smoke or not. Do not smoke! WHAT RESOURCES ARE AVAILABLE TO HELP ME QUIT SMOKING? Your health care provider can direct you to community resources or hospitals for support, which may include:  Group support.  Education.  Hypnosis.  Therapy.   This information is not intended to replace advice given to you by your health care provider. Make sure you discuss any questions you have with your health care provider.   Document Released: 03/28/2004 Document Revised: 07/21/2014 Document Reviewed: 12/16/2012 Elsevier Interactive Patient Education 2016 [employer]  

## 2015-07-04 NOTE — Telephone Encounter (Signed)
per pof to sch ptappt-gave pt copy of avs-adv [facility] will call to sch scan
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
## 2015-07-10 ENCOUNTER — Other Ambulatory Visit: Payer: Self-pay | Admitting: Nurse Practitioner

## 2015-07-17 ENCOUNTER — Other Ambulatory Visit: Payer: Self-pay | Admitting: Medical Oncology

## 2015-07-19 ENCOUNTER — Other Ambulatory Visit: Payer: Self-pay | Admitting: *Deleted

## 2015-07-19 DIAGNOSIS — R112 Nausea with vomiting, unspecified: Secondary | ICD-10-CM

## 2015-07-19 MED ORDER — PROCHLORPERAZINE MALEATE 10 MG PO TABS: 10.0000 mg | ORAL_TABLET | Freq: Four times a day (QID) | ORAL | Status: DC | PRN

## 2015-07-26 ENCOUNTER — Encounter: Payer: Self-pay | Admitting: Cardiothoracic Surgery

## 2015-07-26 ENCOUNTER — Ambulatory Visit (INDEPENDENT_AMBULATORY_CARE_PROVIDER_SITE_OTHER): Payer: Medicare Other | Admitting: Cardiothoracic Surgery

## 2015-07-26 VITALS — Resp 20 | Ht 65.0 in | Wt 195.0 lb

## 2015-07-26 DIAGNOSIS — C3411 Malignant neoplasm of upper lobe, right bronchus or lung: Secondary | ICD-10-CM

## 2015-07-26 DIAGNOSIS — Z902 Acquired absence of lung [part of]: Secondary | ICD-10-CM | POA: Diagnosis not present

## 2015-07-26 DIAGNOSIS — Z85118 Personal history of other malignant neoplasm of bronchus and lung: Secondary | ICD-10-CM
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
## 2015-10-21 IMAGING — CR DG CHEST 1V PORT
1 series · 1 of 1 positions shown · non-contrast
Comparison: 12/04/2014.

CLINICAL DATA: Lung cancer.

EXAM:
PORTABLE CHEST - 1 VIEW

[AP]
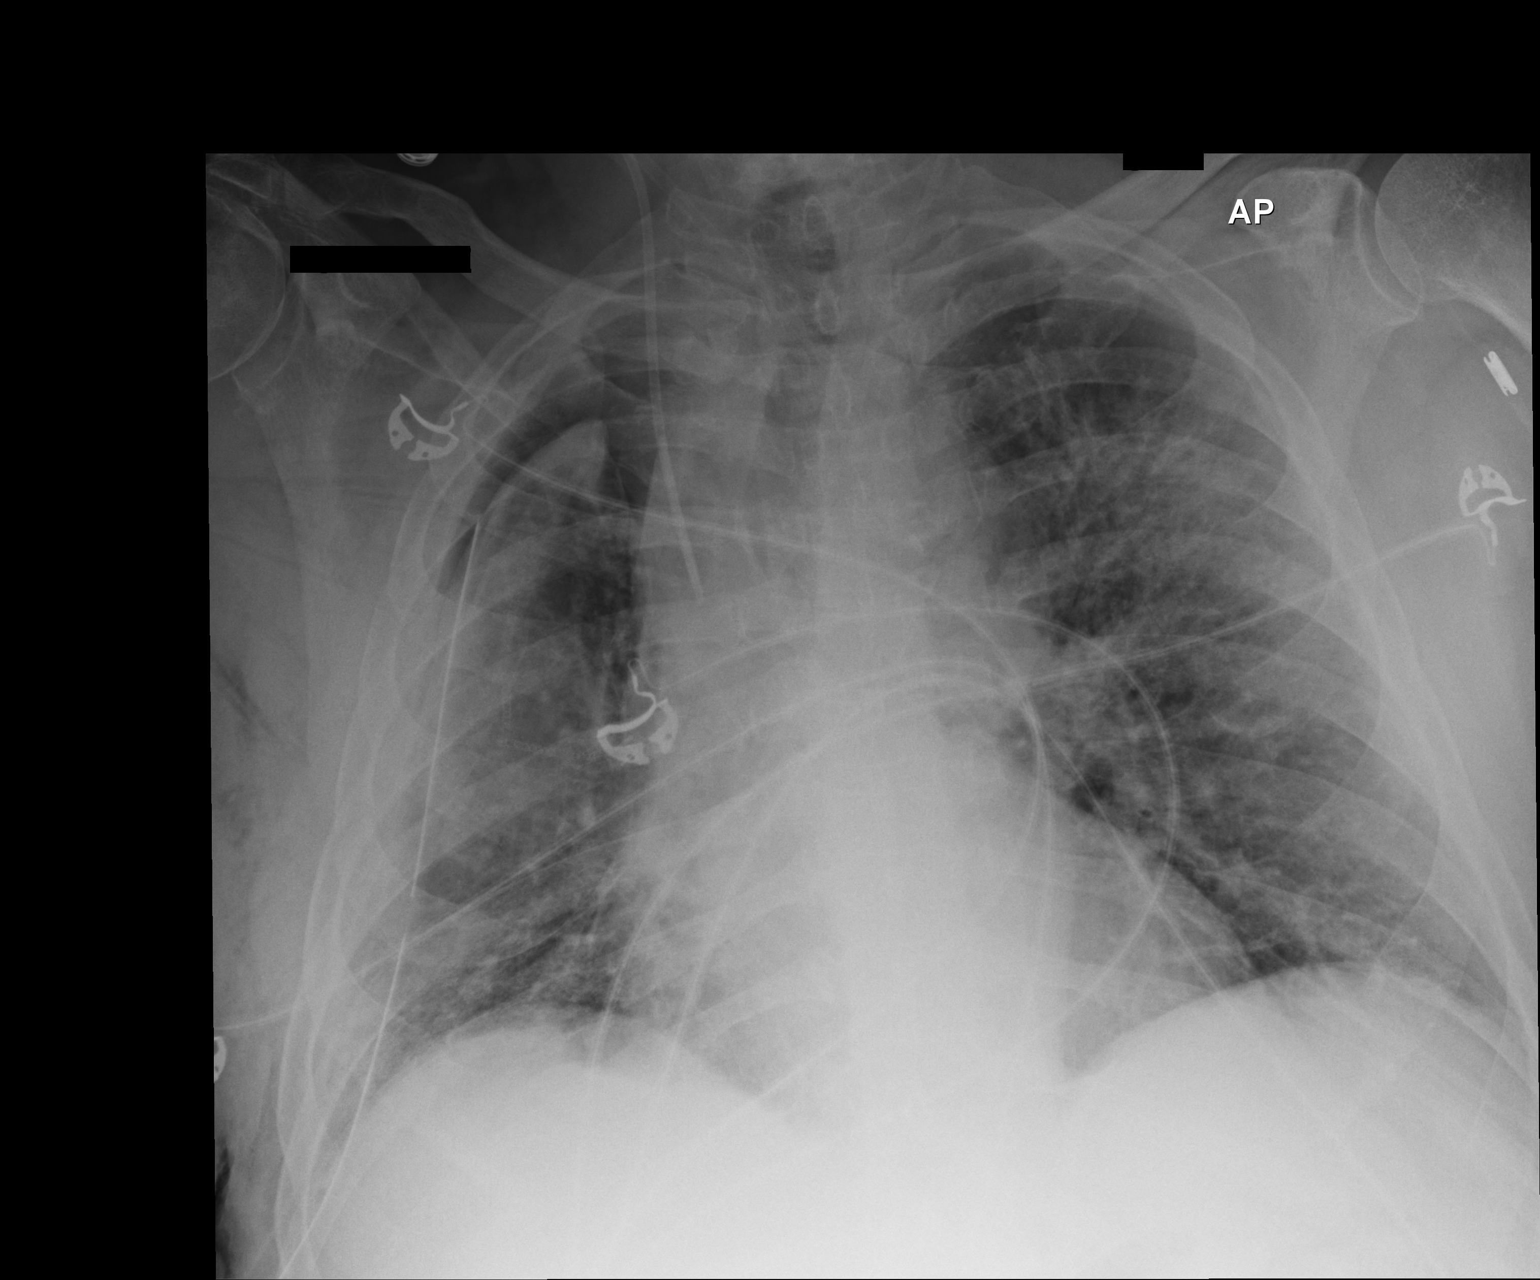

[1 of 1 positions shown; findings below may reference images not displayed]

FINDINGS: Bilateral IJ lines and right chest tube in stable position.
Mediastinum hilar structures are stable. Right pneumothorax stable.
Cardiomegaly with bilateral pulmonary interstitial prominence
consistent with mild congestive heart failure. No pleural effusion.
Right chest wall subcutaneous emphysema.
IMPRESSION: 1. Lines and tubes in stable position. Right chest tube in stable
position. Small right-sided pneumothorax again noted. Right chest
wall subcutaneous again noted.
2. Cardiomegaly with interim appearance of bilateral pulmonary
interstitial prominence consistent with mild congestive heart
failure.

## 2015-10-24 IMAGING — CR DG CHEST 2V
2 series · 2 of 2 positions shown · non-contrast
Comparison: 01/08/2015 12/07/2014, 12/06/2014.

CLINICAL DATA: Chest tube.

EXAM:
CHEST  2 VIEW

[w chest pa]
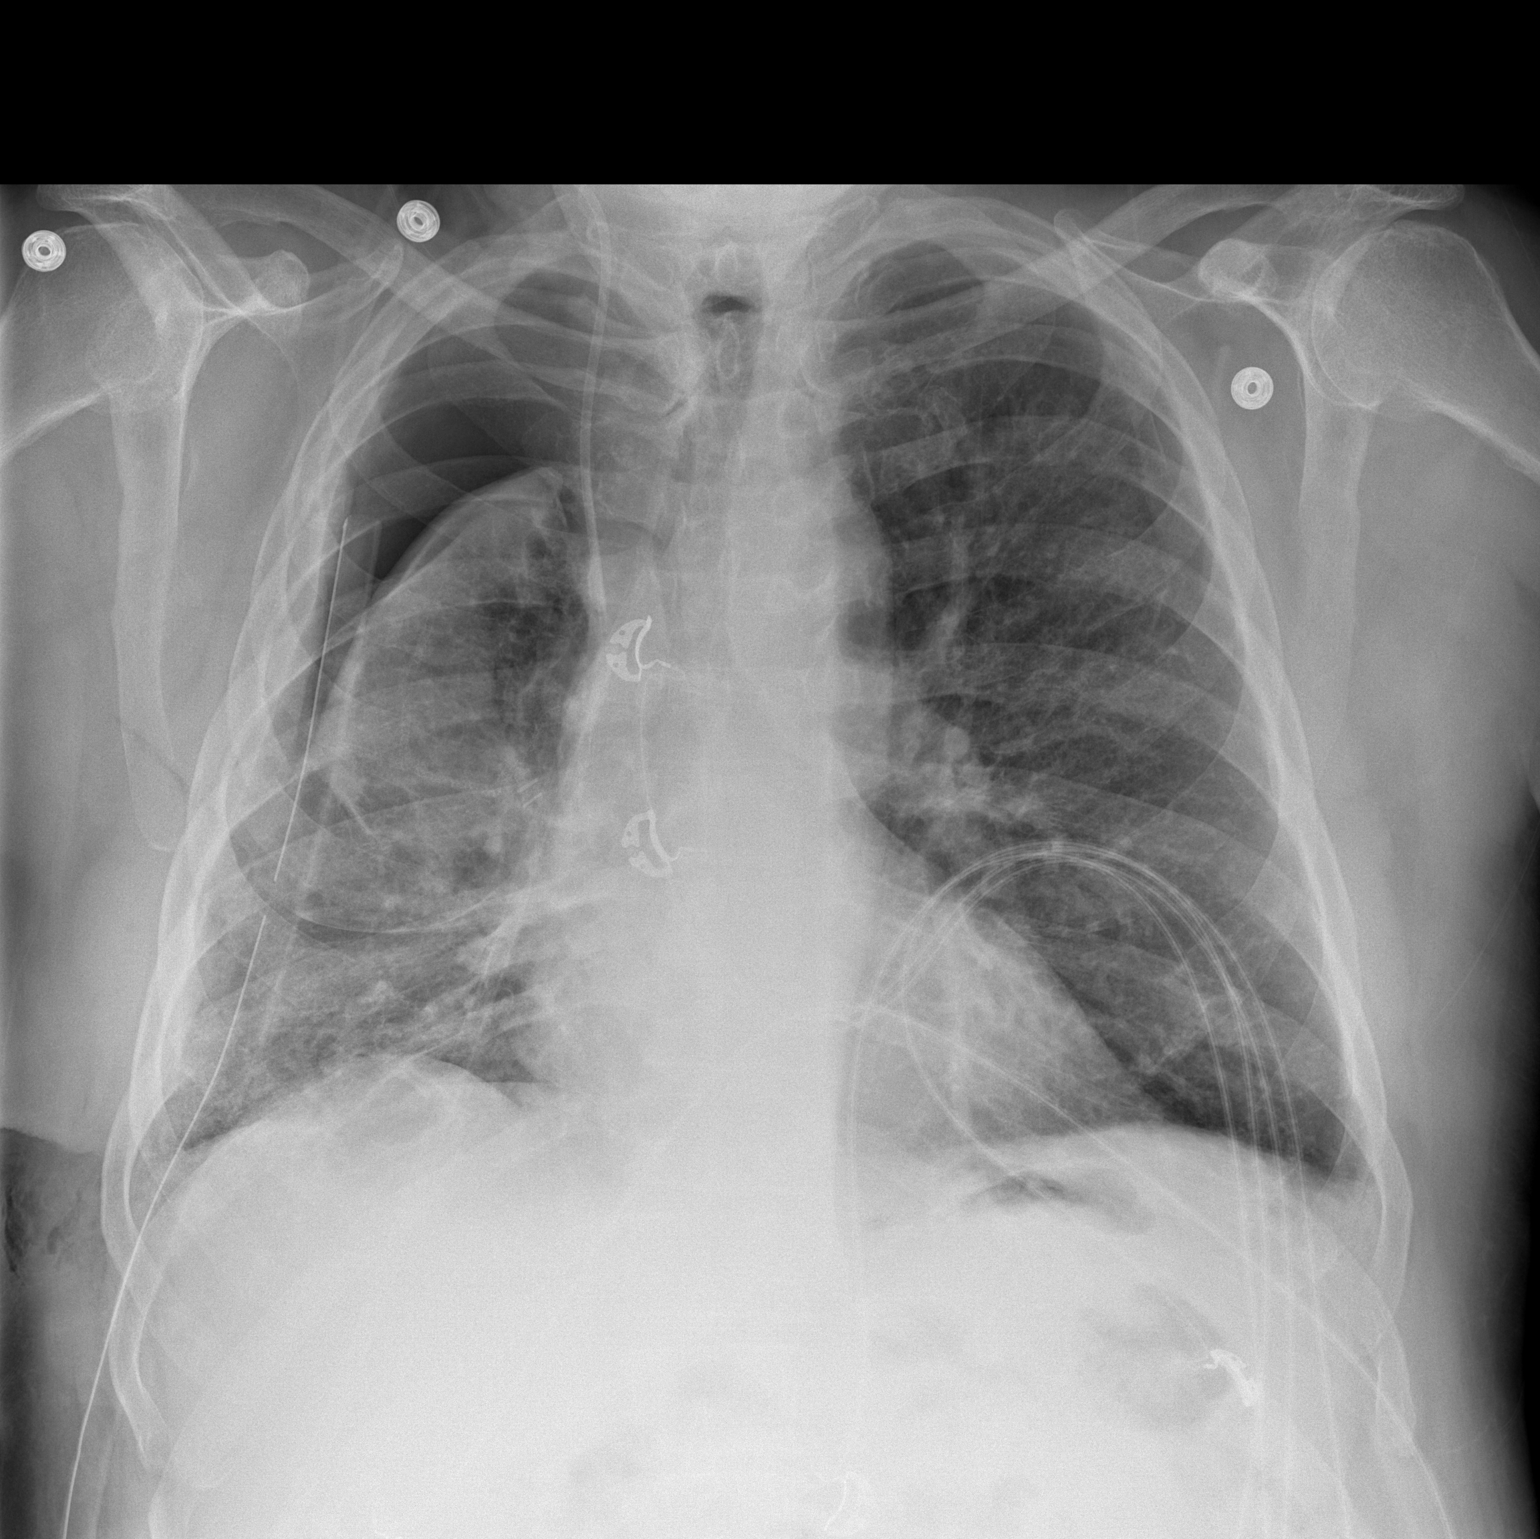

[w chest lat]
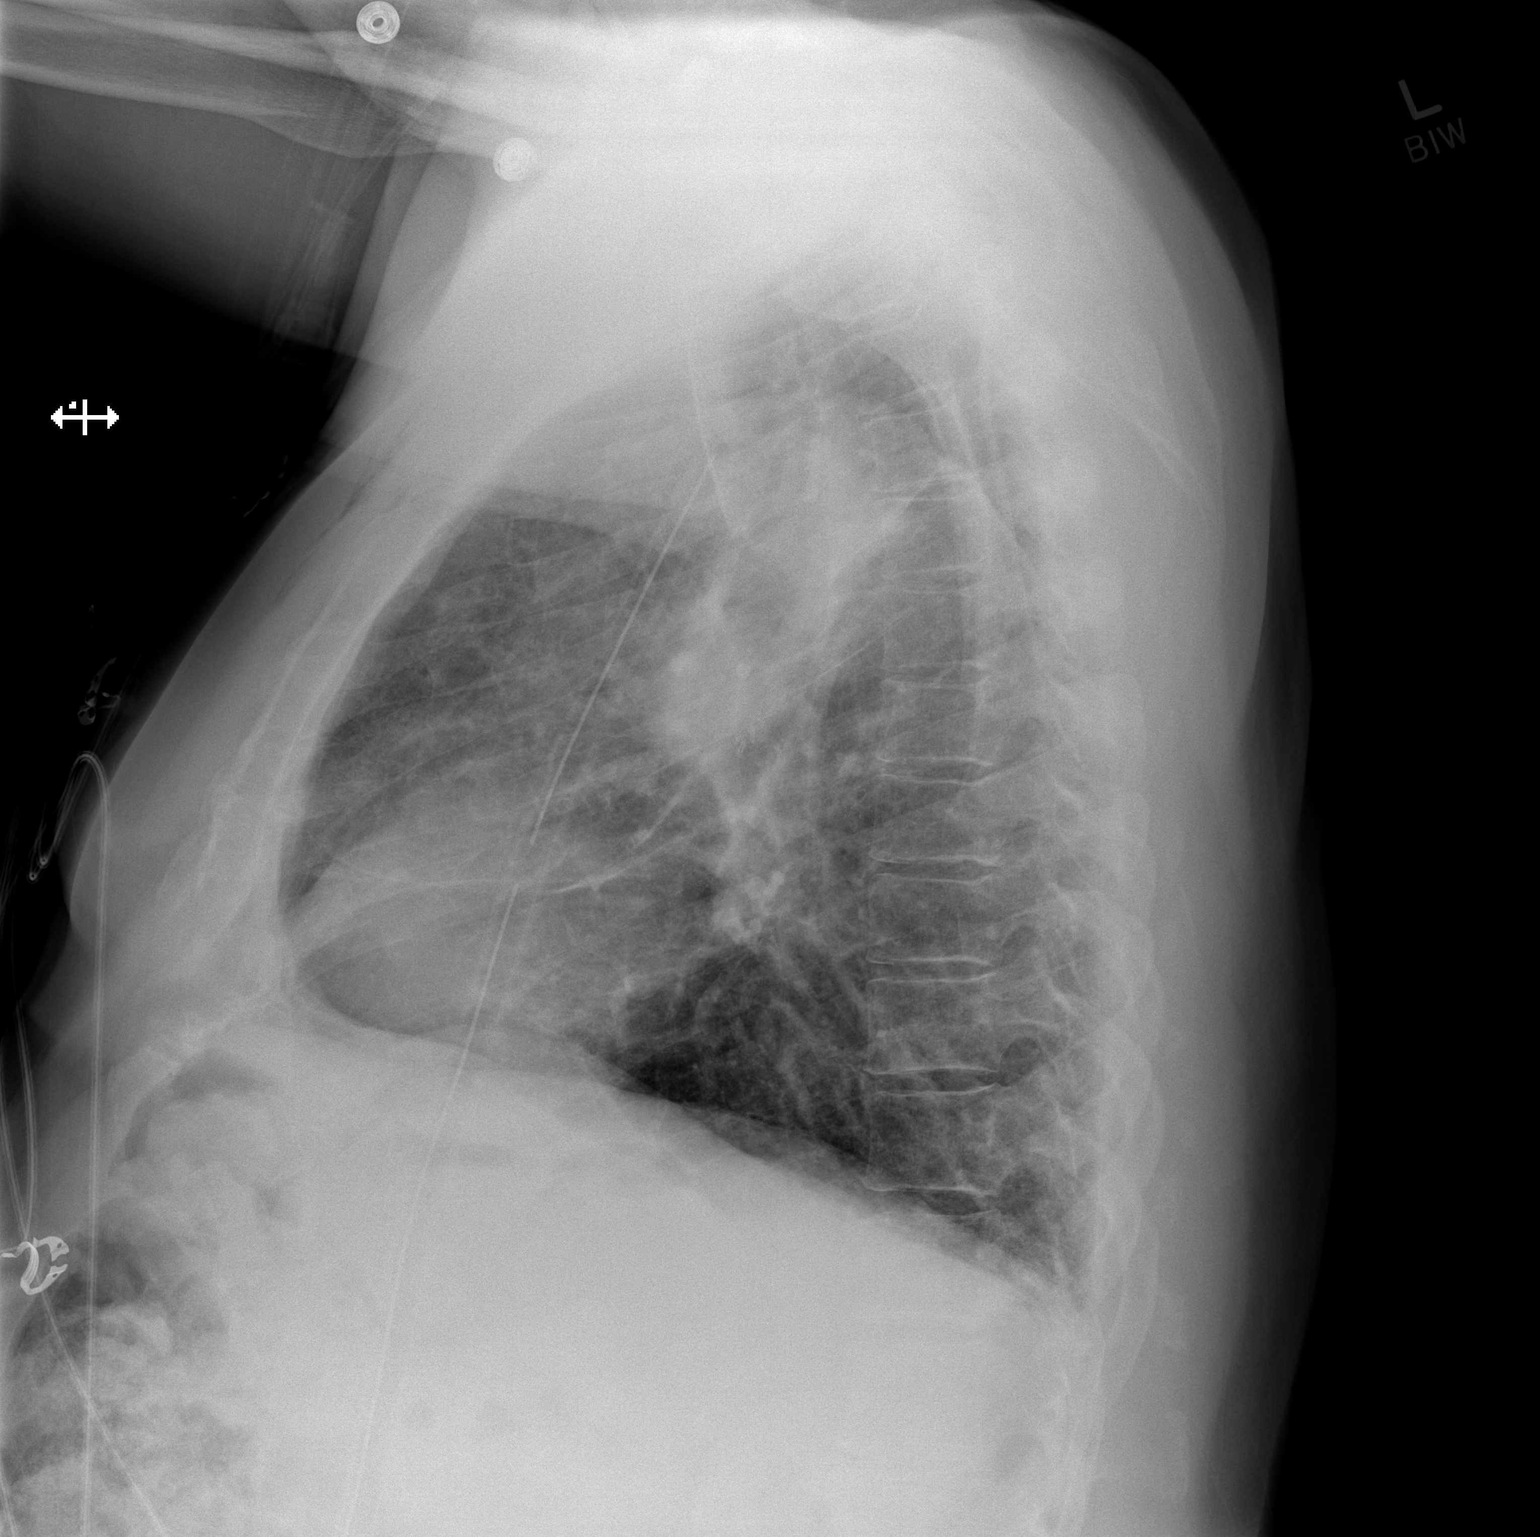

[2 of 2 positions shown; findings below may reference images not displayed]

FINDINGS: Right IJ line and right chest tube in stable position. The
mediastinum hilar structures are stable. Persistent unchanged right
sided pneumothorax and atelectatic changes right lung. Postsurgical
changes right lung. Subcutaneous emphysema right chest. Heart size
stable.
IMPRESSION: 1. Right chest tube in stable position. Stable right pneumothorax.
Stable atelectatic changes and postsurgical changes right lung.
Right chest wall subcutaneous emphysema.
2. Right IJ line in stable position.

## 2015-11-06 ENCOUNTER — Ambulatory Visit: Payer: Medicare Other | Admitting: Pulmonary Disease

## 2015-11-21 IMAGING — CR DG CHEST 2V
2 series · 2 of 2 positions shown · non-contrast
Comparison: December 21, 2014

CLINICAL DATA: Status post right upper lobectomy for lung carcinoma

EXAM:
CHEST  2 VIEW

[w chest pa]
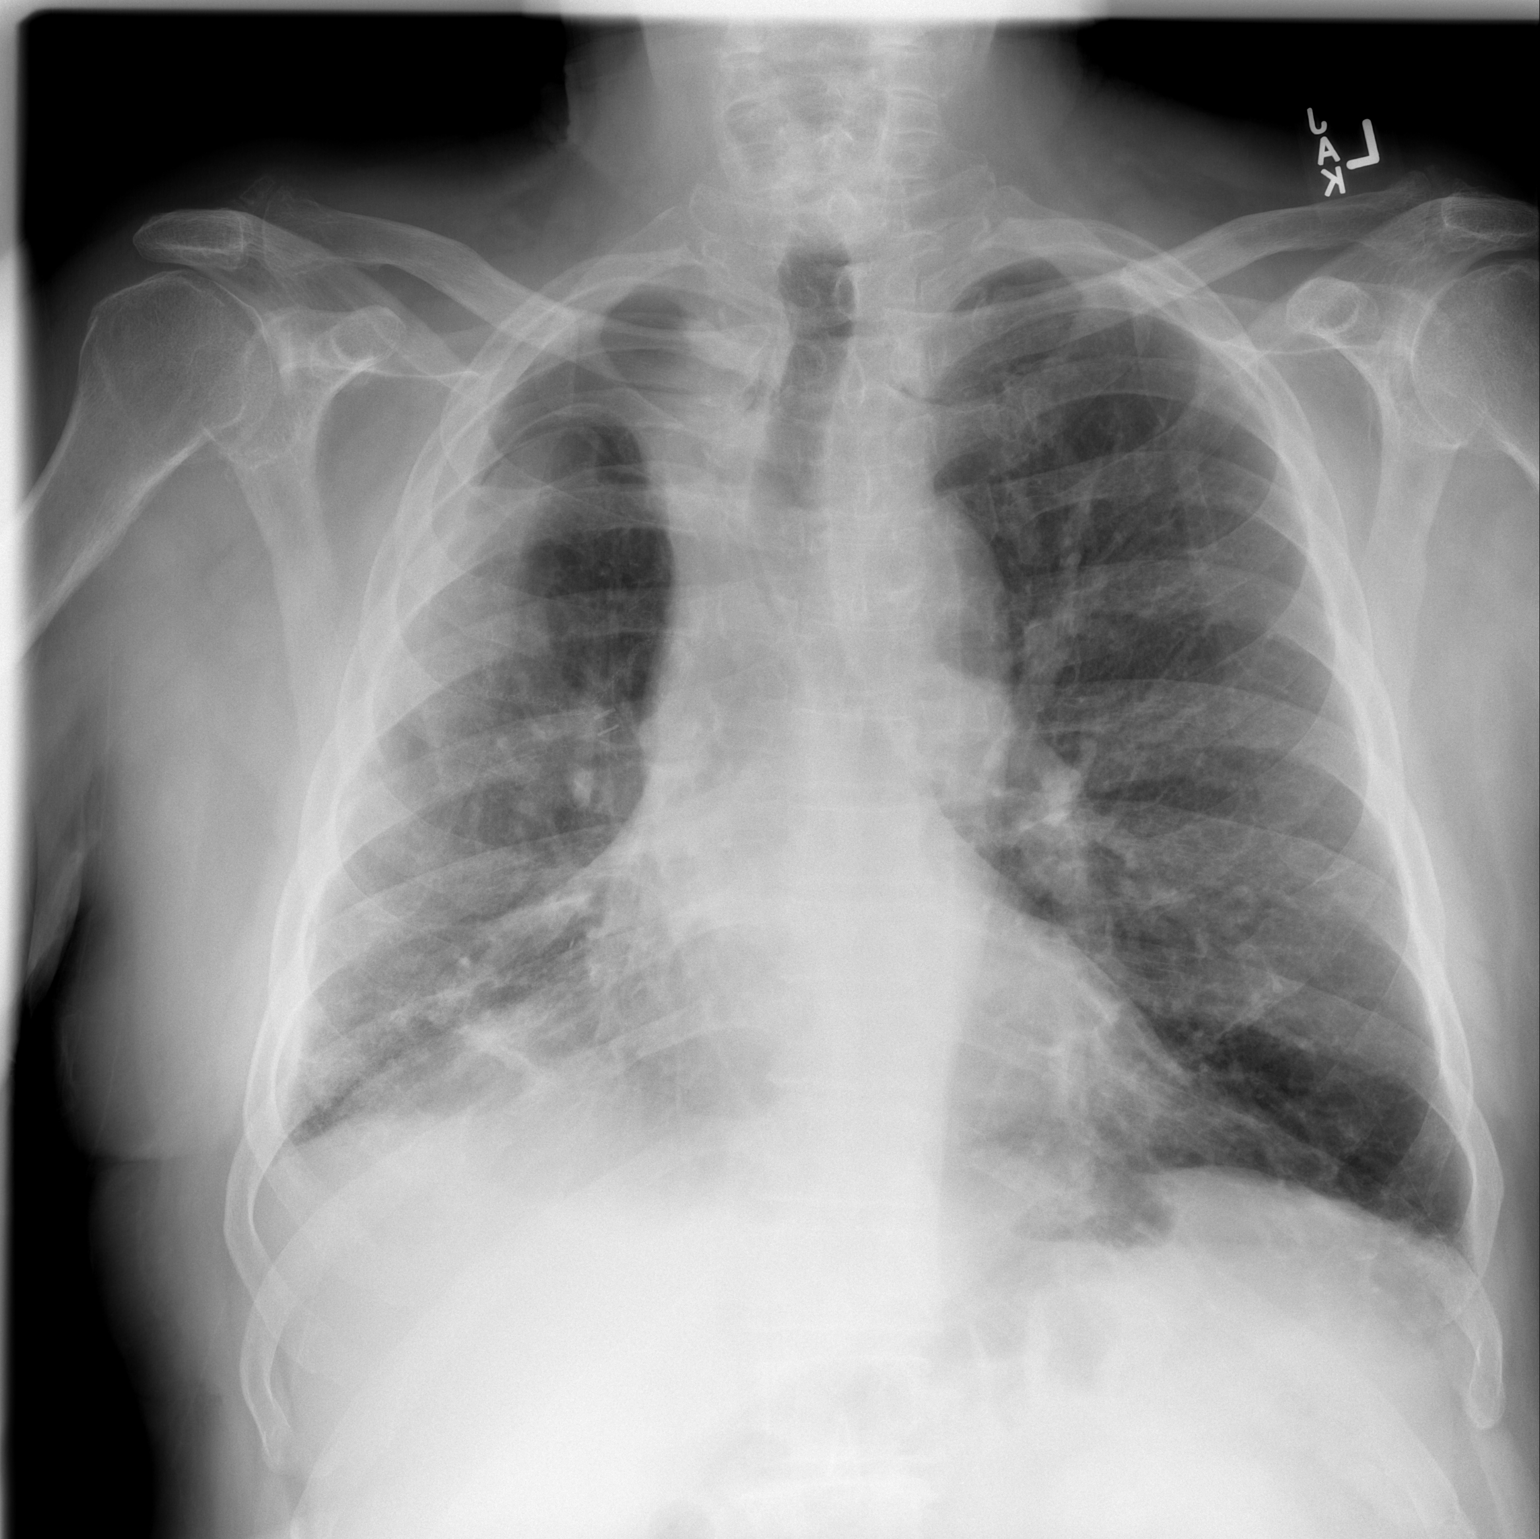

[w chest lat]
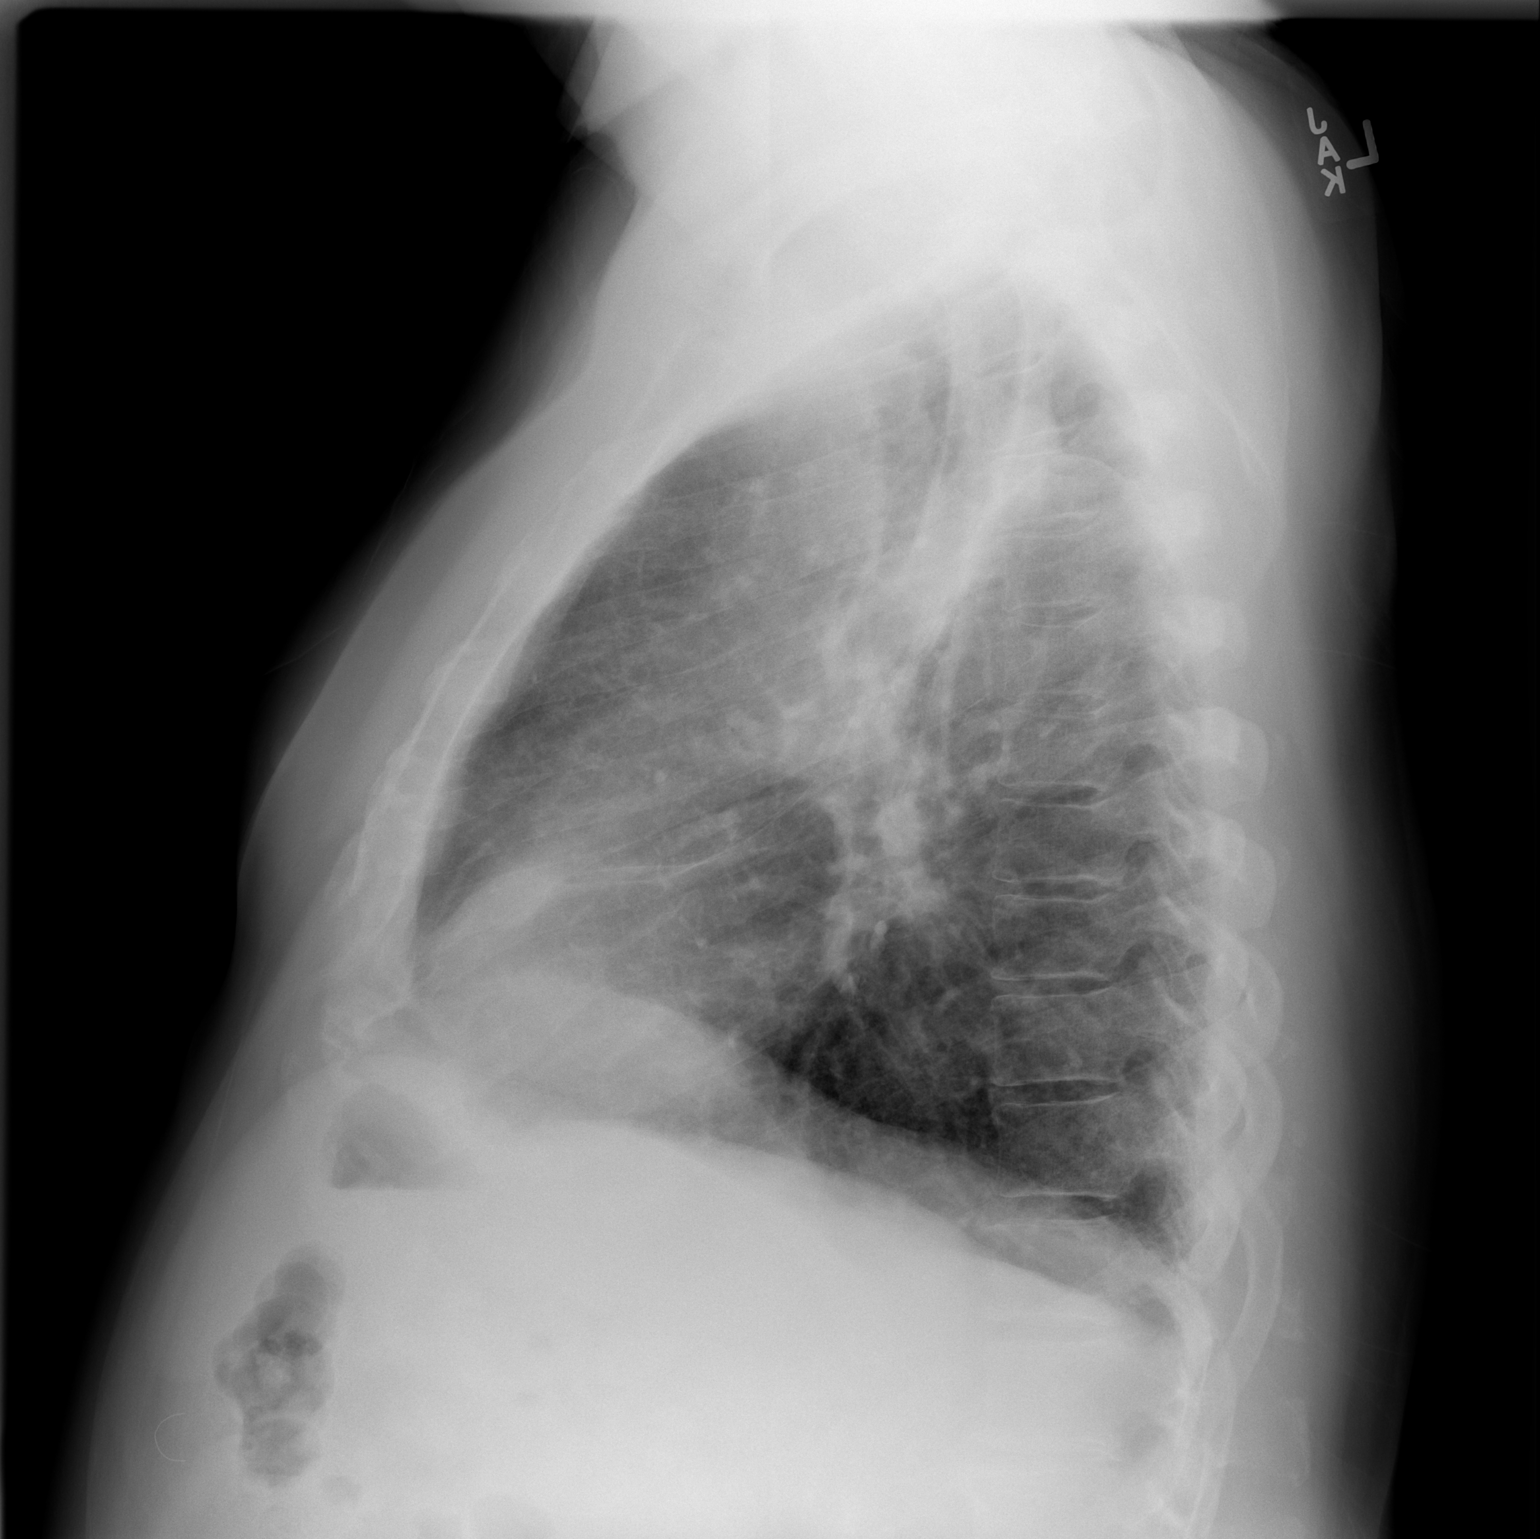

[2 of 2 positions shown; findings below may reference images not displayed]

FINDINGS: The chest tube on the right has been removed. The previously noted
apical pneumothorax the right remain stable. No tension component.
There is postoperative change on the right with right base
atelectasis. The left lung is clear. Heart size and pulmonary
vascularity are normal. No adenopathy. No bone lesions.
IMPRESSION: Right chest tube is been removed with stable right apical
pneumothorax. No tension component. Postoperative change with volume
loss on the right is stable. No new opacity. No change in cardiac
silhouette.

## 2016-01-01 ENCOUNTER — Other Ambulatory Visit (HOSPITAL_BASED_OUTPATIENT_CLINIC_OR_DEPARTMENT_OTHER): Payer: Medicare Other

## 2016-01-01 DIAGNOSIS — C3411 Malignant neoplasm of upper lobe, right bronchus or lung: Secondary | ICD-10-CM

## 2016-01-01 DIAGNOSIS — C3491 Malignant neoplasm of unspecified part of right bronchus or lung: Secondary | ICD-10-CM

## 2016-01-01 LAB — COMPREHENSIVE METABOLIC PANEL
ALBUMIN: 3.7 g/dL (ref 3.5–5.0)
ALT: 41 U/L (ref 0–55)
AST: 34 U/L (ref 5–34)
Alkaline Phosphatase: 74 U/L (ref 40–150)
Anion Gap: 11 mEq/L (ref 3–11)
BUN: 8.7 mg/dL (ref 7.0–26.0)
CO2: 30 meq/L — AB (ref 22–29)
Calcium: 10.1 mg/dL (ref 8.4–10.4)
Chloride: 97 mEq/L — ABNORMAL LOW (ref 98–109)
Creatinine: 1.1 mg/dL (ref 0.7–1.3)
EGFR: 67 mL/min/{1.73_m2} — AB (ref >90)
GLUCOSE: 183 mg/dL — AB (ref 70–140)
POTASSIUM: 4.3 meq/L (ref 3.5–5.1)
SODIUM: 137 meq/L (ref 136–145)
TOTAL PROTEIN: 7.7 g/dL (ref 6.4–8.3)
Total Bilirubin: 0.39 mg/dL (ref 0.20–1.20)

## 2016-01-01 LAB — CBC WITH DIFFERENTIAL/PLATELET
BASO%: 0.9 % (ref 0.0–2.0)
Basophils Absolute: 0.1 10*3/uL (ref 0.0–0.1)
EOS ABS: 0.5 10*3/uL (ref 0.0–0.5)
EOS%: 6.3 % (ref 0.0–7.0)
HCT: 44.3 % (ref 38.4–49.9)
HEMOGLOBIN: 14.8 g/dL (ref 13.0–17.1)
LYMPH%: 19 % (ref 14.0–49.0)
MCH: 32.6 pg (ref 27.2–33.4)
MCHC: 33.5 g/dL (ref 32.0–36.0)
MCV: 97.4 fL (ref 79.3–98.0)
MONO#: 0.9 10*3/uL (ref 0.1–0.9)
MONO%: 12.8 % (ref 0.0–14.0)
NEUT%: 61 % (ref 39.0–75.0)
NEUTROS ABS: 4.5 10*3/uL (ref 1.5–6.5)
Platelets: 185 10*3/uL (ref 140–400)
RBC: 4.55 10*6/uL (ref 4.20–5.82)
RDW: 13.8 % (ref 11.0–14.6)
WBC: 7.3 10*3/uL (ref 4.0–10.3)
lymph#: 1.4 10*3/uL (ref 0.9–3.3)

## 2016-01-07 ENCOUNTER — Telehealth: Payer: Self-pay | Admitting: Internal Medicine

## 2016-01-07 ENCOUNTER — Encounter: Payer: Self-pay | Admitting: Internal Medicine

## 2016-01-07 ENCOUNTER — Ambulatory Visit (HOSPITAL_BASED_OUTPATIENT_CLINIC_OR_DEPARTMENT_OTHER): Payer: Medicare Other | Admitting: Internal Medicine

## 2016-01-07 VITALS — BP 121/66 | HR 115 | Temp 98.4°F | Resp 20 | Ht 65.0 in | Wt 205.6 lb

## 2016-01-07 DIAGNOSIS — J449 Chronic obstructive pulmonary disease, unspecified: Secondary | ICD-10-CM | POA: Diagnosis not present

## 2016-01-07 DIAGNOSIS — R0609 Other forms of dyspnea: Secondary | ICD-10-CM | POA: Diagnosis not present

## 2016-01-07 DIAGNOSIS — C3411 Malignant neoplasm of upper lobe, right bronchus or lung: Secondary | ICD-10-CM

## 2016-01-07 DIAGNOSIS — Z720 Tobacco use: Secondary | ICD-10-CM | POA: Diagnosis not present

## 2016-01-07 DIAGNOSIS — C3491 Malignant neoplasm of unspecified part of right bronchus or lung: Secondary | ICD-10-CM

## 2016-01-07 DIAGNOSIS — J869 Pyothorax without fistula: Secondary | ICD-10-CM
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
## 2016-01-07 NOTE — Telephone Encounter (Signed)
Gave and printed appts ched and avs for pt for DEC  °

## 2016-01-09 ENCOUNTER — Encounter: Payer: Self-pay | Admitting: Internal Medicine

## 2016-01-09 NOTE — Progress Notes (Signed)
left in pod

## 2016-01-10 ENCOUNTER — Encounter: Payer: Self-pay | Admitting: Internal Medicine

## 2016-01-10 NOTE — Progress Notes (Signed)
forms left in box- I left for dr. Julien Nordmann to sign- faxed [phone] and mailed copy to company per Joellen Jersey. copy sent to medical recds also.

## 2016-01-10 NOTE — Progress Notes (Signed)
forms left in box- I left for dr. Julien Nordmann to sign

## 2016-02-01 ENCOUNTER — Ambulatory Visit (INDEPENDENT_AMBULATORY_CARE_PROVIDER_SITE_OTHER): Payer: Medicare Other | Admitting: Pulmonary Disease

## 2016-02-01 ENCOUNTER — Encounter: Payer: Self-pay | Admitting: Pulmonary Disease

## 2016-02-01 VITALS — BP 118/76 | HR 125 | Ht 65.0 in | Wt 215.6 lb

## 2016-02-01 DIAGNOSIS — J432 Centrilobular emphysema: Secondary | ICD-10-CM | POA: Diagnosis not present

## 2016-02-01 DIAGNOSIS — Z720 Tobacco use: Secondary | ICD-10-CM

## 2016-02-01 DIAGNOSIS — J441 Chronic obstructive pulmonary disease with (acute) exacerbation: Secondary | ICD-10-CM

## 2016-02-01 DIAGNOSIS — J449 Chronic obstructive pulmonary disease, unspecified: Secondary | ICD-10-CM | POA: Diagnosis not present

## 2016-02-01 DIAGNOSIS — J4489 Other specified chronic obstructive pulmonary disease: Secondary | ICD-10-CM

## 2016-02-01 MED ORDER — PREDNISONE 10 MG PO TABS
ORAL_TABLET | ORAL | Status: DC
Start: 2016-02-01 — End: 2016-07-03

## 2016-02-01 MED ORDER — TIOTROPIUM BROMIDE MONOHYDRATE 18 MCG IN CAPS: 18.0000 ug | ORAL_CAPSULE | Freq: Every day | RESPIRATORY_TRACT | Status: AC

## 2016-02-01 MED ORDER — ALBUTEROL SULFATE HFA 108 (90 BASE) MCG/ACT IN AERS: 2.0000 | INHALATION_SPRAY | RESPIRATORY_TRACT | Status: AC | PRN

## 2016-02-01 MED ORDER — LEVALBUTEROL HCL 0.63 MG/3ML IN NEBU
0.6300 mg | INHALATION_SOLUTION | Freq: Once | RESPIRATORY_TRACT | Status: AC
  Administered 2016-02-01: 0.63 mg via RESPIRATORY_TRACT

## 2016-02-01 NOTE — Addendum Note (Signed)
Addended by: Virl Cagey on: 02/01/2016 02:43 PM   Modules accepted: Orders

## 2016-02-01 NOTE — Patient Instructions (Signed)
Prednisone 10 mg pill >> 4 pills daily for 2 days, 3 pills daily for 2 days, 2 pills daily for 2 days, 1 pill daily for 2 days  Spiriva one puff daily  Call if not feeling better  Follow up in 6 months

## 2016-02-01 NOTE — Progress Notes (Signed)
Current Outpatient Prescriptions on File Prior to Visit  Medication Sig  . aspirin EC 81 MG tablet Take 1 tablet (81 mg total) by mouth daily.  . budesonide-formoterol (SYMBICORT) 160-4.5 MCG/ACT inhaler Take 2 puffs first thing in am and then another 2 puffs about 12 hours later.  . Cholecalciferol 4000 UNITS CAPS Take 1 capsule by mouth daily.  Marland Kitchen glimepiride (AMARYL) 2 MG tablet Take 2 mg by mouth daily with breakfast.  . losartan-hydrochlorothiazide (HYZAAR) 100-12.5 MG per tablet Take 1 tablet by mouth daily.  . OPANA ER, CRUSH RESISTANT, 40 MG T12A Take by mouth every 12 (twelve) hours.  . Oxycodone HCl 20 MG TABS Take 10 mg by mouth every 4 (four) hours as needed.  . pravastatin (PRAVACHOL) 20 MG tablet Take 20 mg by mouth daily.   . prochlorperazine (COMPAZINE) 10 MG tablet Take 1 tablet (10 mg total) by mouth every 6 (six) hours as needed for nausea or vomiting.   No current facility-administered medications on file prior to visit.    Chief Complaint  Patient presents with  . Follow-up    Pt states that breathing has worsened since last OV. Needs Albuterol hfa rx.     Pulmonary tests PFT 11/15/14 >> FEV1 2.12 (78%), FEV1% 69, TLC 4.65 (77%), DLCO 82%, no BD CT chest 12/03/15 >> mild emphysema, scattered blebs, peripheral/lower honeycombing on Rt, atherosclerosis  Past medical history HTN, HLD, back pain, DM  Past surgical history, Family history, Social history, Allergies reviewed.  Vital signs. BP 118/76 mmHg  Pulse 125  Ht '5\' 5"'$  (1.651 m)  Wt 215 lb 9.6 oz (97.796 kg)  BMI 35.88 kg/m2  SpO2 94%  History of Present Illness: [name] Day is a 70 y.o. 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  His breathing has been rough.  He is having cough with clear sputum.  He is getting wheeze, and winded with activity.  He feels tight in his chest.  He denies fever, hemoptysis, or leg swelling.  He still smokes several  cigarettes per day.  His wife has spiriva >> he tried using this, and it helped.  He has also been using his wife's albuterol nebulizer.  He had CT chest in [place] from 12/03/15.  Physical Exam:  General - No distress ENT - No sinus tenderness, wears dentures, poor dentition Cardiac - s1s2 regular, no murmur Chest - diffuse b/l expiratory wheeze Back - No focal tenderness Abd - Soft, non-tender Ext - No edema Neuro - Normal strength Skin - No rashes Psych - normal mood, and behavior   Assessment/Plan:  COPD exacerbation. - prednisone - defer Abx for now - neb tx in office today - needs to stop smoking  COPD with chronic bronchitis and emphysema. - add spiriva - continue symbicort with prn albuterol HFA - he says he can use his wife's albuterol nebulizer >> advised him to call if he needs more medicine or a nebulizer of his own - he will need Prevnar at next visit  Tobacco abuse. - he will try to stop on his own - discussed other options to assist with smoking cessation  NSCLC. - f/u with oncology   Patient Instructions  Prednisone 10 mg pill >> 4 pills daily for 2 days, 3 pills daily for 2 days, 2 pills daily for 2 days, 1 pill daily for 2 days  Spiriva one puff daily  Call if not feeling better  Follow up in  6 months     Chesley Mires, MD [facility] Pager:  [phone] 02/01/2016, 10:06 AM

## 2016-02-05 ENCOUNTER — Encounter: Payer: Medicare Other | Admitting: Cardiothoracic Surgery

## 2016-03-12 ENCOUNTER — Ambulatory Visit: Payer: Medicare Other | Admitting: Cardiothoracic Surgery

## 2016-03-12 ENCOUNTER — Encounter: Payer: Self-pay | Admitting: Cardiothoracic Surgery

## 2016-03-12 VITALS — BP 106/60 | HR 86 | Resp 18 | Ht 65.0 in | Wt 215.0 lb

## 2016-03-12 DIAGNOSIS — Z902 Acquired absence of lung [part of]: Secondary | ICD-10-CM

## 2016-03-12 DIAGNOSIS — C3411 Malignant neoplasm of upper lobe, right bronchus or lung: Secondary | ICD-10-CM
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
## 2016-04-15 ENCOUNTER — Other Ambulatory Visit: Payer: Self-pay | Admitting: Internal Medicine

## 2016-06-13 ENCOUNTER — Telehealth: Payer: Self-pay | Admitting: Pulmonary Disease

## 2016-06-13 MED ORDER — GLIMEPIRIDE 2 MG PO TABS: 2.0000 mg | ORAL_TABLET | Freq: Every day | ORAL | 2 refills | Status: AC

## 2016-06-13 NOTE — Telephone Encounter (Signed)
Called and spoke with pts wife and she is aware of refill that has been sent in per [facility]>  Nothing further is needed.

## 2016-06-13 NOTE — Telephone Encounter (Signed)
Called and spoke with pts wife and she stated that the pt lost his PCP and has not been able to get into see another one yet.  She stated that the pt has been out of the glimepiride for about 1 week and they wanted to see if [facility] would call this medication in for the pt to last him until he can get in with a new PCP.  VS please advise thanks.   No Known Allergies

## 2016-06-13 NOTE — Telephone Encounter (Signed)
Okay to send script for glimepiride with 2 refills.

## 2016-06-26 ENCOUNTER — Encounter (HOSPITAL_COMMUNITY): Payer: Self-pay

## 2016-06-26 ENCOUNTER — Ambulatory Visit (HOSPITAL_COMMUNITY)
Admission: RE | Admit: 2016-06-26 | Discharge: 2016-06-26 | Disposition: A | Discharge status: Home / Self Care | Payer: Medicare Other | Source: Ambulatory Visit | Attending: Internal Medicine | Admitting: Internal Medicine

## 2016-06-26 ENCOUNTER — Other Ambulatory Visit (HOSPITAL_BASED_OUTPATIENT_CLINIC_OR_DEPARTMENT_OTHER): Payer: Medicare Other

## 2016-06-26 DIAGNOSIS — I700 Atherosclerosis of aorta: Secondary | ICD-10-CM | POA: Diagnosis not present

## 2016-06-26 DIAGNOSIS — J439 Emphysema, unspecified: Secondary | ICD-10-CM | POA: Diagnosis not present

## 2016-06-26 DIAGNOSIS — C3411 Malignant neoplasm of upper lobe, right bronchus or lung: Secondary | ICD-10-CM

## 2016-06-26 DIAGNOSIS — C3491 Malignant neoplasm of unspecified part of right bronchus or lung: Secondary | ICD-10-CM

## 2016-06-26 DIAGNOSIS — J869 Pyothorax without fistula: Secondary | ICD-10-CM | POA: Diagnosis not present

## 2016-06-26 DIAGNOSIS — I2510 Atherosclerotic heart disease of native coronary artery without angina pectoris: Secondary | ICD-10-CM | POA: Diagnosis not present

## 2016-06-26 LAB — CBC WITH DIFFERENTIAL/PLATELET
BASO%: 1.5 % (ref 0.0–2.0)
BASOS ABS: 0.2 10*3/uL — AB (ref 0.0–0.1)
EOS ABS: 0.6 10*3/uL — AB (ref 0.0–0.5)
EOS%: 5.7 % (ref 0.0–7.0)
HCT: 51.9 % — ABNORMAL HIGH (ref 38.4–49.9)
HEMOGLOBIN: 17.2 g/dL — AB (ref 13.0–17.1)
LYMPH%: 19.9 % (ref 14.0–49.0)
MCH: 31.4 pg (ref 27.2–33.4)
MCHC: 33.2 g/dL (ref 32.0–36.0)
MCV: 94.5 fL (ref 79.3–98.0)
MONO#: 0.9 10*3/uL (ref 0.1–0.9)
MONO%: 9.1 % (ref 0.0–14.0)
NEUT#: 6.6 10*3/uL — ABNORMAL HIGH (ref 1.5–6.5)
NEUT%: 63.8 % (ref 39.0–75.0)
Platelets: 178 10*3/uL (ref 140–400)
RBC: 5.49 10*6/uL (ref 4.20–5.82)
RDW: 13.7 % (ref 11.0–14.6)
WBC: 10.4 10*3/uL — ABNORMAL HIGH (ref 4.0–10.3)
lymph#: 2.1 10*3/uL (ref 0.9–3.3)

## 2016-06-26 LAB — COMPREHENSIVE METABOLIC PANEL
ALBUMIN: 3.6 g/dL (ref 3.5–5.0)
ALK PHOS: 88 U/L (ref 40–150)
ALT: 38 U/L (ref 0–55)
AST: 23 U/L (ref 5–34)
Anion Gap: 11 mEq/L (ref 3–11)
BUN: 16.3 mg/dL (ref 7.0–26.0)
CO2: 25 mEq/L (ref 22–29)
Calcium: 10 mg/dL (ref 8.4–10.4)
Chloride: 99 mEq/L (ref 98–109)
Creatinine: 1 mg/dL (ref 0.7–1.3)
EGFR: 79 mL/min/{1.73_m2} — AB (ref >90)
GLUCOSE: 239 mg/dL — AB (ref 70–140)
POTASSIUM: 4.3 meq/L (ref 3.5–5.1)
SODIUM: 136 meq/L (ref 136–145)
Total Bilirubin: 0.5 mg/dL (ref 0.20–1.20)
Total Protein: 7.7 g/dL (ref 6.4–8.3)

## 2016-06-26 MED ORDER — IOPAMIDOL (ISOVUE-300) INJECTION 61%
75.0000 mL | Freq: Once | INTRAVENOUS | Status: AC | PRN
  Administered 2016-06-26: 75 mL via INTRAVENOUS

## 2016-07-03 ENCOUNTER — Encounter: Payer: Self-pay | Admitting: Internal Medicine

## 2016-07-03 ENCOUNTER — Ambulatory Visit (HOSPITAL_BASED_OUTPATIENT_CLINIC_OR_DEPARTMENT_OTHER): Payer: Medicare Other | Admitting: Internal Medicine

## 2016-07-03 VITALS — BP 128/74 | HR 103 | Temp 98.3°F | Resp 19 | Ht 65.0 in | Wt 198.5 lb

## 2016-07-03 DIAGNOSIS — Z720 Tobacco use: Secondary | ICD-10-CM | POA: Diagnosis not present

## 2016-07-03 DIAGNOSIS — C3491 Malignant neoplasm of unspecified part of right bronchus or lung: Secondary | ICD-10-CM

## 2016-07-03 DIAGNOSIS — J449 Chronic obstructive pulmonary disease, unspecified: Secondary | ICD-10-CM | POA: Diagnosis not present

## 2016-07-03 DIAGNOSIS — C3411 Malignant neoplasm of upper lobe, right bronchus or lung: Secondary | ICD-10-CM | POA: Diagnosis not present
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
## 2016-07-03 NOTE — Patient Instructions (Signed)
Steps to Quit Smoking Smoking tobacco can be bad for your health. It can also affect almost every organ in your body. Smoking puts you and people around you at risk for many serious long-lasting (chronic) diseases. Quitting smoking is hard, but it is one of the best things that you can do for your health. It is never too late to quit. What are the benefits of quitting smoking? When you quit smoking, you lower your risk for getting serious diseases and conditions. They can include:  Lung cancer or lung disease.  Heart disease.  Stroke.  Heart attack.  Not being able to have children (infertility).  Weak bones (osteoporosis) and broken bones (fractures). If you have coughing, wheezing, and shortness of breath, those symptoms may get better when you quit. You may also get sick less often. If you are pregnant, quitting smoking can help to lower your chances of having a baby of low birth weight. What can I do to help me quit smoking? Talk with your doctor about what can help you quit smoking. Some things you can do (strategies) include:  Quitting smoking totally, instead of slowly cutting back how much you smoke over a period of time.  Going to in-person counseling. You are more likely to quit if you go to many counseling sessions.  Using resources and support systems, such as:  Online chats with a counselor.  Phone quitlines.  Printed self-help materials.  Support groups or group counseling.  Text messaging programs.  Mobile phone apps or applications.  Taking medicines. Some of these medicines may have nicotine in them. If you are pregnant or breastfeeding, do not take any medicines to quit smoking unless your doctor says it is okay. Talk with your doctor about counseling or other things that can help you. Talk with your doctor about using more than one strategy at the same time, such as taking medicines while you are also going to in-person counseling. This can help make quitting  easier. What things can I do to make it easier to quit? Quitting smoking might feel very hard at first, but there is a lot that you can do to make it easier. Take these steps:  Talk to your family and friends. Ask them to support and encourage you.  Call phone quitlines, reach out to support groups, or work with a counselor.  Ask people who smoke to not smoke around you.  Avoid places that make you want (trigger) to smoke, such as:  Bars.  Parties.  Smoke-break areas at work.  Spend time with people who do not smoke.  Lower the stress in your life. Stress can make you want to smoke. Try these things to help your stress:  Getting regular exercise.  Deep-breathing exercises.  Yoga.  Meditating.  Doing a body scan. To do this, close your eyes, focus on one area of your body at a time from head to toe, and notice which parts of your body are tense. Try to relax the muscles in those areas.  Download or buy apps on your mobile phone or tablet that can help you stick to your quit plan. There are many free apps, such as QuitGuide from the CDC (Centers for Disease Control and Prevention). You can find more support from smokefree.gov and other websites. This information is not intended to replace advice given to you by your health care provider. Make sure you discuss any questions you have with your health care provider. Document Released: 04/26/2009 Document Revised: 02/26/2016 Document   Reviewed: 11/14/2014 Elsevier Interactive Patient Education  2017 [employer].  

## 2016-07-16 ENCOUNTER — Telehealth: Payer: Self-pay | Admitting: Internal Medicine

## 2016-07-16 NOTE — Telephone Encounter (Signed)
Spoke with patient and confirmed both December appointments

## 2016-07-20 DIAGNOSIS — M5416 Radiculopathy, lumbar region: Secondary | ICD-10-CM | POA: Diagnosis not present

## 2016-07-20 DIAGNOSIS — M545 Low back pain: Secondary | ICD-10-CM | POA: Diagnosis not present

## 2016-08-06 DIAGNOSIS — G620 Drug-induced polyneuropathy: Secondary | ICD-10-CM | POA: Diagnosis not present

## 2016-08-06 DIAGNOSIS — M4722 Other spondylosis with radiculopathy, cervical region: Secondary | ICD-10-CM | POA: Diagnosis not present

## 2016-08-06 DIAGNOSIS — M47816 Spondylosis without myelopathy or radiculopathy, lumbar region: Secondary | ICD-10-CM | POA: Diagnosis not present

## 2016-08-06 DIAGNOSIS — G894 Chronic pain syndrome: Secondary | ICD-10-CM | POA: Diagnosis not present

## 2016-08-18 DIAGNOSIS — E11638 Type 2 diabetes mellitus with other oral complications: Secondary | ICD-10-CM | POA: Diagnosis not present

## 2016-08-18 DIAGNOSIS — E785 Hyperlipidemia, unspecified: Secondary | ICD-10-CM | POA: Diagnosis not present

## 2016-08-18 DIAGNOSIS — Z23 Encounter for immunization: Secondary | ICD-10-CM | POA: Diagnosis not present

## 2016-08-18 DIAGNOSIS — I10 Essential (primary) hypertension: Secondary | ICD-10-CM | POA: Diagnosis not present

## 2016-08-18 DIAGNOSIS — J449 Chronic obstructive pulmonary disease, unspecified: Secondary | ICD-10-CM | POA: Diagnosis not present

## 2016-08-28 ENCOUNTER — Telehealth: Payer: Self-pay | Admitting: Internal Medicine

## 2016-08-28 NOTE — Telephone Encounter (Signed)
Faxed requested records to [facility] [phone] per medical release #[id number]

## 2016-09-03 DIAGNOSIS — M47816 Spondylosis without myelopathy or radiculopathy, lumbar region: Secondary | ICD-10-CM | POA: Diagnosis not present

## 2016-09-03 DIAGNOSIS — G620 Drug-induced polyneuropathy: Secondary | ICD-10-CM | POA: Diagnosis not present

## 2016-09-03 DIAGNOSIS — M4722 Other spondylosis with radiculopathy, cervical region: Secondary | ICD-10-CM | POA: Diagnosis not present

## 2016-09-03 DIAGNOSIS — G894 Chronic pain syndrome: Secondary | ICD-10-CM | POA: Diagnosis not present

## 2016-09-11 DIAGNOSIS — J069 Acute upper respiratory infection, unspecified: Secondary | ICD-10-CM | POA: Diagnosis not present

## 2016-09-11 DIAGNOSIS — Z79891 Long term (current) use of opiate analgesic: Secondary | ICD-10-CM | POA: Diagnosis not present

## 2016-09-11 DIAGNOSIS — I10 Essential (primary) hypertension: Secondary | ICD-10-CM | POA: Diagnosis not present

## 2016-09-11 DIAGNOSIS — E785 Hyperlipidemia, unspecified: Secondary | ICD-10-CM | POA: Diagnosis not present

## 2016-09-11 DIAGNOSIS — E11638 Type 2 diabetes mellitus with other oral complications: Secondary | ICD-10-CM | POA: Diagnosis not present

## 2016-09-11 DIAGNOSIS — J441 Chronic obstructive pulmonary disease with (acute) exacerbation: Secondary | ICD-10-CM | POA: Diagnosis not present

## 2016-09-11 DIAGNOSIS — Z79899 Other long term (current) drug therapy: Secondary | ICD-10-CM | POA: Diagnosis not present

## 2016-09-22 ENCOUNTER — Ambulatory Visit: Payer: Medicare Other | Admitting: Pulmonary Disease

## 2016-09-22 DIAGNOSIS — E11638 Type 2 diabetes mellitus with other oral complications: Secondary | ICD-10-CM | POA: Diagnosis not present

## 2016-09-22 DIAGNOSIS — J449 Chronic obstructive pulmonary disease, unspecified: Secondary | ICD-10-CM | POA: Diagnosis not present

## 2016-10-01 DIAGNOSIS — G894 Chronic pain syndrome: Secondary | ICD-10-CM | POA: Diagnosis not present

## 2016-10-01 DIAGNOSIS — M48061 Spinal stenosis, lumbar region without neurogenic claudication: Secondary | ICD-10-CM | POA: Diagnosis not present

## 2016-10-01 DIAGNOSIS — M4722 Other spondylosis with radiculopathy, cervical region: Secondary | ICD-10-CM | POA: Diagnosis not present

## 2016-10-01 DIAGNOSIS — M47816 Spondylosis without myelopathy or radiculopathy, lumbar region: Secondary | ICD-10-CM | POA: Diagnosis not present

## 2016-10-09 DIAGNOSIS — Z85118 Personal history of other malignant neoplasm of bronchus and lung: Secondary | ICD-10-CM | POA: Diagnosis not present

## 2016-10-09 DIAGNOSIS — R0902 Hypoxemia: Secondary | ICD-10-CM | POA: Diagnosis not present

## 2016-10-09 DIAGNOSIS — G894 Chronic pain syndrome: Secondary | ICD-10-CM | POA: Diagnosis not present

## 2016-10-09 DIAGNOSIS — J441 Chronic obstructive pulmonary disease with (acute) exacerbation: Secondary | ICD-10-CM | POA: Diagnosis not present

## 2016-10-09 DIAGNOSIS — M545 Low back pain: Secondary | ICD-10-CM | POA: Diagnosis not present

## 2016-10-09 DIAGNOSIS — J9601 Acute respiratory failure with hypoxia: Secondary | ICD-10-CM | POA: Diagnosis not present

## 2016-10-09 DIAGNOSIS — Z7984 Long term (current) use of oral hypoglycemic drugs: Secondary | ICD-10-CM | POA: Diagnosis not present

## 2016-10-09 DIAGNOSIS — D72828 Other elevated white blood cell count: Secondary | ICD-10-CM | POA: Diagnosis not present

## 2016-10-09 DIAGNOSIS — Z7982 Long term (current) use of aspirin: Secondary | ICD-10-CM | POA: Diagnosis not present

## 2016-10-09 DIAGNOSIS — R7881 Bacteremia: Secondary | ICD-10-CM | POA: Diagnosis not present

## 2016-10-09 DIAGNOSIS — T380X5A Adverse effect of glucocorticoids and synthetic analogues, initial encounter: Secondary | ICD-10-CM | POA: Diagnosis not present

## 2016-10-09 DIAGNOSIS — E1165 Type 2 diabetes mellitus with hyperglycemia: Secondary | ICD-10-CM | POA: Diagnosis not present

## 2016-10-09 DIAGNOSIS — E7800 Pure hypercholesterolemia, unspecified: Secondary | ICD-10-CM | POA: Diagnosis not present

## 2016-10-09 DIAGNOSIS — Z7189 Other specified counseling: Secondary | ICD-10-CM | POA: Diagnosis not present

## 2016-10-09 DIAGNOSIS — J189 Pneumonia, unspecified organism: Secondary | ICD-10-CM | POA: Diagnosis not present

## 2016-10-09 DIAGNOSIS — J181 Lobar pneumonia, unspecified organism: Secondary | ICD-10-CM | POA: Diagnosis not present

## 2016-10-09 DIAGNOSIS — R05 Cough: Secondary | ICD-10-CM | POA: Diagnosis not present

## 2016-10-09 DIAGNOSIS — J440 Chronic obstructive pulmonary disease with acute lower respiratory infection: Secondary | ICD-10-CM | POA: Diagnosis not present

## 2016-10-09 DIAGNOSIS — R079 Chest pain, unspecified: Secondary | ICD-10-CM | POA: Diagnosis not present

## 2016-10-09 DIAGNOSIS — R0602 Shortness of breath: Secondary | ICD-10-CM | POA: Diagnosis not present

## 2016-10-09 DIAGNOSIS — Z79899 Other long term (current) drug therapy: Secondary | ICD-10-CM | POA: Diagnosis not present

## 2016-10-09 DIAGNOSIS — R069 Unspecified abnormalities of breathing: Secondary | ICD-10-CM | POA: Diagnosis not present

## 2016-10-09 DIAGNOSIS — Z902 Acquired absence of lung [part of]: Secondary | ICD-10-CM | POA: Diagnosis not present

## 2016-10-16 DIAGNOSIS — J441 Chronic obstructive pulmonary disease with (acute) exacerbation: Secondary | ICD-10-CM | POA: Diagnosis not present

## 2016-10-16 DIAGNOSIS — J181 Lobar pneumonia, unspecified organism: Secondary | ICD-10-CM | POA: Diagnosis not present

## 2016-10-29 DIAGNOSIS — G894 Chronic pain syndrome: Secondary | ICD-10-CM | POA: Diagnosis not present

## 2016-10-29 DIAGNOSIS — M4722 Other spondylosis with radiculopathy, cervical region: Secondary | ICD-10-CM | POA: Diagnosis not present

## 2016-10-29 DIAGNOSIS — M47816 Spondylosis without myelopathy or radiculopathy, lumbar region: Secondary | ICD-10-CM | POA: Diagnosis not present

## 2016-10-29 DIAGNOSIS — M48061 Spinal stenosis, lumbar region without neurogenic claudication: Secondary | ICD-10-CM | POA: Diagnosis not present

## 2016-11-03 DIAGNOSIS — E11638 Type 2 diabetes mellitus with other oral complications: Secondary | ICD-10-CM | POA: Diagnosis not present

## 2016-11-03 DIAGNOSIS — I10 Essential (primary) hypertension: Secondary | ICD-10-CM | POA: Diagnosis not present

## 2016-11-03 DIAGNOSIS — J449 Chronic obstructive pulmonary disease, unspecified: Secondary | ICD-10-CM | POA: Diagnosis not present

## 2016-11-26 DIAGNOSIS — M47816 Spondylosis without myelopathy or radiculopathy, lumbar region: Secondary | ICD-10-CM | POA: Diagnosis not present

## 2016-11-26 DIAGNOSIS — M48061 Spinal stenosis, lumbar region without neurogenic claudication: Secondary | ICD-10-CM | POA: Diagnosis not present

## 2016-11-26 DIAGNOSIS — M4722 Other spondylosis with radiculopathy, cervical region: Secondary | ICD-10-CM | POA: Diagnosis not present

## 2016-11-26 DIAGNOSIS — G894 Chronic pain syndrome: Secondary | ICD-10-CM | POA: Diagnosis not present

## 2016-12-24 DIAGNOSIS — M48061 Spinal stenosis, lumbar region without neurogenic claudication: Secondary | ICD-10-CM | POA: Diagnosis not present

## 2016-12-24 DIAGNOSIS — G894 Chronic pain syndrome: Secondary | ICD-10-CM | POA: Diagnosis not present

## 2016-12-24 DIAGNOSIS — M47816 Spondylosis without myelopathy or radiculopathy, lumbar region: Secondary | ICD-10-CM | POA: Diagnosis not present

## 2016-12-24 DIAGNOSIS — M4722 Other spondylosis with radiculopathy, cervical region: Secondary | ICD-10-CM | POA: Diagnosis not present

## 2016-12-26 DIAGNOSIS — A419 Sepsis, unspecified organism: Secondary | ICD-10-CM | POA: Diagnosis not present

## 2016-12-26 DIAGNOSIS — J9601 Acute respiratory failure with hypoxia: Secondary | ICD-10-CM | POA: Diagnosis not present

## 2016-12-26 DIAGNOSIS — M1990 Unspecified osteoarthritis, unspecified site: Secondary | ICD-10-CM | POA: Diagnosis not present

## 2016-12-26 DIAGNOSIS — R05 Cough: Secondary | ICD-10-CM | POA: Diagnosis not present

## 2016-12-26 DIAGNOSIS — M549 Dorsalgia, unspecified: Secondary | ICD-10-CM | POA: Diagnosis not present

## 2016-12-26 DIAGNOSIS — G9349 Other encephalopathy: Secondary | ICD-10-CM | POA: Diagnosis not present

## 2016-12-26 DIAGNOSIS — Z720 Tobacco use: Secondary | ICD-10-CM | POA: Diagnosis not present

## 2016-12-26 DIAGNOSIS — E7800 Pure hypercholesterolemia, unspecified: Secondary | ICD-10-CM | POA: Diagnosis not present

## 2016-12-26 DIAGNOSIS — M545 Low back pain: Secondary | ICD-10-CM | POA: Diagnosis not present

## 2016-12-26 DIAGNOSIS — C3492 Malignant neoplasm of unspecified part of left bronchus or lung: Secondary | ICD-10-CM | POA: Diagnosis not present

## 2016-12-26 DIAGNOSIS — J181 Lobar pneumonia, unspecified organism: Secondary | ICD-10-CM | POA: Diagnosis not present

## 2016-12-26 DIAGNOSIS — J189 Pneumonia, unspecified organism: Secondary | ICD-10-CM | POA: Diagnosis not present

## 2016-12-26 DIAGNOSIS — I10 Essential (primary) hypertension: Secondary | ICD-10-CM | POA: Diagnosis not present

## 2016-12-26 DIAGNOSIS — J440 Chronic obstructive pulmonary disease with acute lower respiratory infection: Secondary | ICD-10-CM | POA: Diagnosis not present

## 2016-12-26 DIAGNOSIS — E1165 Type 2 diabetes mellitus with hyperglycemia: Secondary | ICD-10-CM | POA: Diagnosis not present

## 2016-12-26 DIAGNOSIS — Z7982 Long term (current) use of aspirin: Secondary | ICD-10-CM | POA: Diagnosis not present

## 2016-12-26 DIAGNOSIS — Z79899 Other long term (current) drug therapy: Secondary | ICD-10-CM | POA: Diagnosis not present

## 2016-12-26 DIAGNOSIS — Z902 Acquired absence of lung [part of]: Secondary | ICD-10-CM | POA: Diagnosis not present

## 2016-12-26 DIAGNOSIS — J441 Chronic obstructive pulmonary disease with (acute) exacerbation: Secondary | ICD-10-CM | POA: Diagnosis not present

## 2016-12-26 DIAGNOSIS — C3490 Malignant neoplasm of unspecified part of unspecified bronchus or lung: Secondary | ICD-10-CM | POA: Diagnosis not present

## 2017-01-02 DIAGNOSIS — Z79899 Other long term (current) drug therapy: Secondary | ICD-10-CM | POA: Diagnosis not present

## 2017-01-02 DIAGNOSIS — E11638 Type 2 diabetes mellitus with other oral complications: Secondary | ICD-10-CM | POA: Diagnosis not present

## 2017-01-02 DIAGNOSIS — J449 Chronic obstructive pulmonary disease, unspecified: Secondary | ICD-10-CM | POA: Diagnosis not present

## 2017-01-21 DIAGNOSIS — G894 Chronic pain syndrome: Secondary | ICD-10-CM | POA: Diagnosis not present

## 2017-01-21 DIAGNOSIS — M4722 Other spondylosis with radiculopathy, cervical region: Secondary | ICD-10-CM | POA: Diagnosis not present

## 2017-01-21 DIAGNOSIS — M48061 Spinal stenosis, lumbar region without neurogenic claudication: Secondary | ICD-10-CM | POA: Diagnosis not present

## 2017-01-21 DIAGNOSIS — M47816 Spondylosis without myelopathy or radiculopathy, lumbar region: Secondary | ICD-10-CM | POA: Diagnosis not present

## 2017-02-03 DIAGNOSIS — J209 Acute bronchitis, unspecified: Secondary | ICD-10-CM | POA: Diagnosis not present

## 2017-02-03 DIAGNOSIS — J449 Chronic obstructive pulmonary disease, unspecified: Secondary | ICD-10-CM | POA: Diagnosis not present

## 2017-02-03 DIAGNOSIS — R0602 Shortness of breath: Secondary | ICD-10-CM | POA: Diagnosis not present

## 2017-02-03 DIAGNOSIS — J440 Chronic obstructive pulmonary disease with acute lower respiratory infection: Secondary | ICD-10-CM | POA: Diagnosis not present

## 2017-02-18 DIAGNOSIS — M48061 Spinal stenosis, lumbar region without neurogenic claudication: Secondary | ICD-10-CM | POA: Diagnosis not present

## 2017-02-18 DIAGNOSIS — M47816 Spondylosis without myelopathy or radiculopathy, lumbar region: Secondary | ICD-10-CM | POA: Diagnosis not present

## 2017-02-18 DIAGNOSIS — G894 Chronic pain syndrome: Secondary | ICD-10-CM | POA: Diagnosis not present

## 2017-02-18 DIAGNOSIS — M4722 Other spondylosis with radiculopathy, cervical region: Secondary | ICD-10-CM | POA: Diagnosis not present

## 2017-03-20 DIAGNOSIS — M48061 Spinal stenosis, lumbar region without neurogenic claudication: Secondary | ICD-10-CM | POA: Diagnosis not present

## 2017-03-20 DIAGNOSIS — G894 Chronic pain syndrome: Secondary | ICD-10-CM | POA: Diagnosis not present

## 2017-03-20 DIAGNOSIS — M4722 Other spondylosis with radiculopathy, cervical region: Secondary | ICD-10-CM | POA: Diagnosis not present

## 2017-03-20 DIAGNOSIS — M47816 Spondylosis without myelopathy or radiculopathy, lumbar region: Secondary | ICD-10-CM | POA: Diagnosis not present

## 2017-04-17 DIAGNOSIS — M47816 Spondylosis without myelopathy or radiculopathy, lumbar region: Secondary | ICD-10-CM | POA: Diagnosis not present

## 2017-04-17 DIAGNOSIS — M48061 Spinal stenosis, lumbar region without neurogenic claudication: Secondary | ICD-10-CM | POA: Diagnosis not present

## 2017-04-17 DIAGNOSIS — M4722 Other spondylosis with radiculopathy, cervical region: Secondary | ICD-10-CM | POA: Diagnosis not present

## 2017-04-17 DIAGNOSIS — G894 Chronic pain syndrome: Secondary | ICD-10-CM | POA: Diagnosis not present

## 2017-04-22 DIAGNOSIS — I10 Essential (primary) hypertension: Secondary | ICD-10-CM | POA: Diagnosis not present

## 2017-04-22 DIAGNOSIS — Z23 Encounter for immunization: Secondary | ICD-10-CM | POA: Diagnosis not present

## 2017-04-22 DIAGNOSIS — E11638 Type 2 diabetes mellitus with other oral complications: Secondary | ICD-10-CM | POA: Diagnosis not present

## 2017-04-22 DIAGNOSIS — E785 Hyperlipidemia, unspecified: Secondary | ICD-10-CM | POA: Diagnosis not present

## 2017-04-22 DIAGNOSIS — J449 Chronic obstructive pulmonary disease, unspecified: Secondary | ICD-10-CM | POA: Diagnosis not present

## 2017-04-27 ENCOUNTER — Telehealth: Payer: Self-pay | Admitting: Medical Oncology

## 2017-04-27 NOTE — Telephone Encounter (Signed)
Returned  All to [place] and told her pt comes annually for ct scan and f/u and curretnly on observation for early stage lung cancer.

## 2017-05-15 DIAGNOSIS — M546 Pain in thoracic spine: Secondary | ICD-10-CM | POA: Diagnosis not present

## 2017-05-15 DIAGNOSIS — M47816 Spondylosis without myelopathy or radiculopathy, lumbar region: Secondary | ICD-10-CM | POA: Diagnosis not present

## 2017-05-15 DIAGNOSIS — G894 Chronic pain syndrome: Secondary | ICD-10-CM | POA: Diagnosis not present

## 2017-05-15 DIAGNOSIS — M4722 Other spondylosis with radiculopathy, cervical region: Secondary | ICD-10-CM | POA: Diagnosis not present

## 2017-05-28 DIAGNOSIS — H25042 Posterior subcapsular polar age-related cataract, left eye: Secondary | ICD-10-CM | POA: Diagnosis not present

## 2017-05-28 DIAGNOSIS — E119 Type 2 diabetes mellitus without complications: Secondary | ICD-10-CM | POA: Diagnosis not present

## 2017-05-28 DIAGNOSIS — H25813 Combined forms of age-related cataract, bilateral: Secondary | ICD-10-CM | POA: Diagnosis not present

## 2017-06-12 DIAGNOSIS — M4722 Other spondylosis with radiculopathy, cervical region: Secondary | ICD-10-CM | POA: Diagnosis not present

## 2017-06-12 DIAGNOSIS — G894 Chronic pain syndrome: Secondary | ICD-10-CM | POA: Diagnosis not present

## 2017-06-12 DIAGNOSIS — M47816 Spondylosis without myelopathy or radiculopathy, lumbar region: Secondary | ICD-10-CM | POA: Diagnosis not present

## 2017-06-12 DIAGNOSIS — M546 Pain in thoracic spine: Secondary | ICD-10-CM | POA: Diagnosis not present

## 2017-06-26 ENCOUNTER — Other Ambulatory Visit (HOSPITAL_BASED_OUTPATIENT_CLINIC_OR_DEPARTMENT_OTHER): Payer: Medicare Other

## 2017-06-26 ENCOUNTER — Ambulatory Visit (HOSPITAL_COMMUNITY)
Admission: RE | Admit: 2017-06-26 | Discharge: 2017-06-26 | Disposition: A | Discharge status: Home / Self Care | Payer: Medicare Other | Source: Ambulatory Visit | Attending: Internal Medicine | Admitting: Internal Medicine

## 2017-06-26 ENCOUNTER — Encounter (HOSPITAL_COMMUNITY): Payer: Self-pay

## 2017-06-26 DIAGNOSIS — C3411 Malignant neoplasm of upper lobe, right bronchus or lung: Secondary | ICD-10-CM

## 2017-06-26 DIAGNOSIS — Z902 Acquired absence of lung [part of]: Secondary | ICD-10-CM | POA: Diagnosis not present

## 2017-06-26 DIAGNOSIS — J439 Emphysema, unspecified: Secondary | ICD-10-CM | POA: Insufficient documentation

## 2017-06-26 DIAGNOSIS — R932 Abnormal findings on diagnostic imaging of liver and biliary tract: Secondary | ICD-10-CM | POA: Diagnosis not present

## 2017-06-26 DIAGNOSIS — I2510 Atherosclerotic heart disease of native coronary artery without angina pectoris: Secondary | ICD-10-CM | POA: Insufficient documentation

## 2017-06-26 DIAGNOSIS — J449 Chronic obstructive pulmonary disease, unspecified: Secondary | ICD-10-CM

## 2017-06-26 DIAGNOSIS — C3491 Malignant neoplasm of unspecified part of right bronchus or lung: Secondary | ICD-10-CM | POA: Diagnosis not present

## 2017-06-26 DIAGNOSIS — I700 Atherosclerosis of aorta: Secondary | ICD-10-CM | POA: Diagnosis not present

## 2017-06-26 LAB — COMPREHENSIVE METABOLIC PANEL
ALT: 47 U/L (ref 0–55)
ANION GAP: 11 meq/L (ref 3–11)
AST: 38 U/L — ABNORMAL HIGH (ref 5–34)
Albumin: 3.9 g/dL (ref 3.5–5.0)
Alkaline Phosphatase: 73 U/L (ref 40–150)
BUN: 12.8 mg/dL (ref 7.0–26.0)
CALCIUM: 9.6 mg/dL (ref 8.4–10.4)
CHLORIDE: 104 meq/L (ref 98–109)
CO2: 24 meq/L (ref 22–29)
CREATININE: 0.8 mg/dL (ref 0.7–1.3)
Glucose: 87 mg/dl (ref 70–140)
POTASSIUM: 4.3 meq/L (ref 3.5–5.1)
Sodium: 139 mEq/L (ref 136–145)
Total Bilirubin: 0.51 mg/dL (ref 0.20–1.20)
Total Protein: 7.6 g/dL (ref 6.4–8.3)

## 2017-06-26 LAB — CBC WITH DIFFERENTIAL/PLATELET
BASO%: 0.5 % (ref 0.0–2.0)
Basophils Absolute: 0 10*3/uL (ref 0.0–0.1)
EOS%: 4.9 % (ref 0.0–7.0)
Eosinophils Absolute: 0.4 10*3/uL (ref 0.0–0.5)
HCT: 50.4 % — ABNORMAL HIGH (ref 38.4–49.9)
HGB: 16.5 g/dL (ref 13.0–17.1)
LYMPH%: 17.8 % (ref 14.0–49.0)
MCH: 31.6 pg (ref 27.2–33.4)
MCHC: 32.7 g/dL (ref 32.0–36.0)
MCV: 96.6 fL (ref 79.3–98.0)
MONO#: 0.5 10*3/uL (ref 0.1–0.9)
MONO%: 6.1 % (ref 0.0–14.0)
NEUT%: 70.7 % (ref 39.0–75.0)
NEUTROS ABS: 5.9 10*3/uL (ref 1.5–6.5)
PLATELETS: 192 10*3/uL (ref 140–400)
RBC: 5.22 10*6/uL (ref 4.20–5.82)
RDW: 14 % (ref 11.0–14.6)
WBC: 8.3 10*3/uL (ref 4.0–10.3)
lymph#: 1.5 10*3/uL (ref 0.9–3.3)

## 2017-06-26 MED ORDER — IOPAMIDOL (ISOVUE-300) INJECTION 61%
INTRAVENOUS | Status: AC
  Filled 2017-06-26: qty 75

## 2017-06-26 MED ORDER — IOPAMIDOL (ISOVUE-300) INJECTION 61%
75.0000 mL | Freq: Once | INTRAVENOUS | Status: AC | PRN
  Administered 2017-06-26: 75 mL via INTRAVENOUS

## 2017-06-29 ENCOUNTER — Telehealth: Payer: Self-pay | Admitting: Medical Oncology

## 2017-06-29 NOTE — Telephone Encounter (Signed)
Ct chest Impression reported  'IMPRESSION: 1. Status post right upper lobectomy. 2. Slowly enlarging left apical pulmonary nodule, favored to represent metachronous primary bronchogenic carcinoma. 3. Coronary artery atherosclerosis. Aortic Atherosclerosis (ICD10-I70.0). 4. Right hepatic lobe low-density area appears less well-defined and measures larger today. This could represent a benign stable lesion with adjacent altered perfusion, or be less well-defined secondary to differences in bolus timing. Metastatic disease (presumably adjacent to the previously detailed lesion) cannot be entirely exclude. This could be more entirely characterized with pre and post contrast abdominal MRI, or for further staging given the presumed new primary, evaluated with PET. 5. Emphysema (ICD10-J43.9). Suspect concurrent interstitial lung disease, possibly nonspecific interstitial pneumonitis. These results will be called to the ordering clinician or representative by the Radiologist Assistant, and communication documented in the PACS or zVision Dashboard.

## 2017-06-29 NOTE — Telephone Encounter (Signed)
Asking for call back to give results for Ct scan. Scan in chart and note to [facility].

## 2017-07-02 ENCOUNTER — Encounter: Payer: Self-pay | Admitting: Internal Medicine

## 2017-07-02 ENCOUNTER — Ambulatory Visit (HOSPITAL_BASED_OUTPATIENT_CLINIC_OR_DEPARTMENT_OTHER): Payer: Medicare Other | Admitting: Internal Medicine

## 2017-07-02 VITALS — BP 139/81 | HR 82 | Temp 97.9°F | Resp 19 | Ht 65.0 in | Wt 179.0 lb

## 2017-07-02 DIAGNOSIS — R911 Solitary pulmonary nodule: Secondary | ICD-10-CM | POA: Diagnosis not present

## 2017-07-02 DIAGNOSIS — C3490 Malignant neoplasm of unspecified part of unspecified bronchus or lung: Secondary | ICD-10-CM

## 2017-07-02 DIAGNOSIS — Z720 Tobacco use: Secondary | ICD-10-CM | POA: Diagnosis not present

## 2017-07-02 DIAGNOSIS — J449 Chronic obstructive pulmonary disease, unspecified: Secondary | ICD-10-CM

## 2017-07-02 DIAGNOSIS — Z716 Tobacco abuse counseling: Secondary | ICD-10-CM

## 2017-07-02 DIAGNOSIS — C3491 Malignant neoplasm of unspecified part of right bronchus or lung: Secondary | ICD-10-CM

## 2017-07-02 DIAGNOSIS — C3411 Malignant neoplasm of upper lobe, right bronchus or lung: Secondary | ICD-10-CM | POA: Diagnosis not present
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
## 2017-07-04 ENCOUNTER — Encounter: Payer: Self-pay | Admitting: Internal Medicine

## 2017-07-04 DIAGNOSIS — Z716 Tobacco abuse counseling: Secondary | ICD-10-CM | POA: Insufficient documentation

## 2017-07-06 NOTE — Progress Notes (Signed)
Thoracic Location of Tumor / Histology: CT scan on 06/26/17 suspicious for metachronous primary of left apical pulmonary nodule.   Patient presented with symptoms of: He had a scheduled CT scan on 06/26/17 that showed stable disease of his Stage IB non-small cell lung cancer diagnosed 11/2014, but showed a slowly enlarging left apical pulmonary nodule suspicious for metachronous primary.  Biopsies of revealed: No biopsy done on recently observed disease.    Tobacco/Marijuana/Snuff/ETOH use: He is currently smoking. 5-6 cigarettes daily , no smokeles tobacco,alcohol or drug use  Past/Anticipated interventions by cardiothoracic surgery, if any:  Right upper lobectomy with lymph node dissection May 2016 by Dr. Servando Snare. None for most recent suspicious stage IA lung cancer.  Past/Anticipated interventions by medical oncology, if any:  07/02/17 Dr. Earlie Server Stage IB (T2a, N0, M0) non-small cell lung cancer, squamous cell carcinoma diagnosed in May 2016. The patient also has suspicious stage IA lung cancer in the left lower lobe. PRIOR THERAPY:  1) Status post right upper lobectomy with lymph node dissection in May 2016 under the care of Dr. Servando Snare. 2) Adjuvant systemic chemotherapy with carboplatin for AUC of 5 and paclitaxel 175 MG/M2 every 3 weeks. Status post 3 cycles. CURRENT THERAPY: Observation.  He had a repeat CT scan of the chest performed recently.  I personally and independently reviewed the scan images and discussed the results with the patient and his family. Has a scan showed a stable disease except for slowly enlarging left apical pulmonary nodule suspicious for metachronous primary. I discussed with the patient and his family several options for management of his condition including close observation and monitoring with repeat imaging studies and 3-6 months versus referral to radiation oncology for consideration of stereotactic radiotherapy to this lesion. The patient and his  family are interested in seeing radiation oncology for discussion of this option. I will make the referral to radiation oncology and arrange for the patient to come back for follow-up visit in 6 months for evaluation with repeat CT scan of the chest for restaging of his disease.   Signs/Symptoms  Weight changes, if any: loss 13 lbs  Respiratory complaints, if any: SOB with exertion  Hemoptysis, if any:  no  Pain issues, if any: lower back down hips and leg, and neck  SAFETY ISSUES: no  Prior radiation? No  Pacemaker/ICD? No  /Ahe patient on methotrexate? No  Current Complaints / other details:  Married,  Sister lung cancer living, Father deceased lung mets bone BP 129/67   Pulse (!) 124   Temp 99.1 F (37.3 C)   Resp 20   Ht 5\' 5"  (1.651 m)   Wt 181 lb 9.6 oz (82.4 kg)   SpO2 93%   BMI 30.22 kg/m   Wt Readings from Last 3 Encounters:  07/09/17 181 lb 9.6 oz (82.4 kg)  07/02/17 179 lb (81.2 kg)  07/03/16 198 lb 8 oz (90 kg)

## 2017-07-08 DIAGNOSIS — M47816 Spondylosis without myelopathy or radiculopathy, lumbar region: Secondary | ICD-10-CM | POA: Diagnosis not present

## 2017-07-08 DIAGNOSIS — M4722 Other spondylosis with radiculopathy, cervical region: Secondary | ICD-10-CM | POA: Diagnosis not present

## 2017-07-08 DIAGNOSIS — G894 Chronic pain syndrome: Secondary | ICD-10-CM | POA: Diagnosis not present

## 2017-07-08 DIAGNOSIS — M546 Pain in thoracic spine: Secondary | ICD-10-CM | POA: Diagnosis not present
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
## 2017-07-09 ENCOUNTER — Ambulatory Visit
Admission: RE | Admit: 2017-07-09 | Discharge: 2017-07-09 | Disposition: A | Discharge status: Home / Self Care | Payer: Medicare Other | Source: Ambulatory Visit | Attending: Radiation Oncology | Admitting: Radiation Oncology

## 2017-07-09 ENCOUNTER — Encounter: Payer: Self-pay | Admitting: Radiation Oncology

## 2017-07-09 DIAGNOSIS — Z7951 Long term (current) use of inhaled steroids: Secondary | ICD-10-CM | POA: Diagnosis not present

## 2017-07-09 DIAGNOSIS — Z79899 Other long term (current) drug therapy: Secondary | ICD-10-CM | POA: Diagnosis not present

## 2017-07-09 DIAGNOSIS — C3412 Malignant neoplasm of upper lobe, left bronchus or lung: Secondary | ICD-10-CM | POA: Insufficient documentation

## 2017-07-09 DIAGNOSIS — R911 Solitary pulmonary nodule: Secondary | ICD-10-CM | POA: Diagnosis not present

## 2017-07-09 DIAGNOSIS — Z7984 Long term (current) use of oral hypoglycemic drugs: Secondary | ICD-10-CM | POA: Insufficient documentation

## 2017-07-09 DIAGNOSIS — F17210 Nicotine dependence, cigarettes, uncomplicated: Secondary | ICD-10-CM | POA: Insufficient documentation

## 2017-07-09 DIAGNOSIS — Z9221 Personal history of antineoplastic chemotherapy: Secondary | ICD-10-CM | POA: Insufficient documentation

## 2017-07-09 DIAGNOSIS — I700 Atherosclerosis of aorta: Secondary | ICD-10-CM | POA: Diagnosis not present

## 2017-07-09 DIAGNOSIS — I2510 Atherosclerotic heart disease of native coronary artery without angina pectoris: Secondary | ICD-10-CM | POA: Diagnosis not present

## 2017-07-09 DIAGNOSIS — Z85118 Personal history of other malignant neoplasm of bronchus and lung: Secondary | ICD-10-CM | POA: Diagnosis not present

## 2017-07-09 DIAGNOSIS — I10 Essential (primary) hypertension: Secondary | ICD-10-CM | POA: Insufficient documentation

## 2017-07-09 DIAGNOSIS — Z808 Family history of malignant neoplasm of other organs or systems: Secondary | ICD-10-CM | POA: Insufficient documentation

## 2017-07-09 DIAGNOSIS — J449 Chronic obstructive pulmonary disease, unspecified: Secondary | ICD-10-CM | POA: Diagnosis not present

## 2017-07-09 DIAGNOSIS — Z923 Personal history of irradiation: Secondary | ICD-10-CM | POA: Insufficient documentation

## 2017-07-09 DIAGNOSIS — M7989 Other specified soft tissue disorders: Secondary | ICD-10-CM | POA: Insufficient documentation

## 2017-07-09 DIAGNOSIS — Z933 Colostomy status: Secondary | ICD-10-CM | POA: Diagnosis not present

## 2017-07-09 DIAGNOSIS — R2681 Unsteadiness on feet: Secondary | ICD-10-CM | POA: Diagnosis not present

## 2017-07-09 DIAGNOSIS — Z8249 Family history of ischemic heart disease and other diseases of the circulatory system: Secondary | ICD-10-CM | POA: Diagnosis not present

## 2017-07-09 DIAGNOSIS — Z7982 Long term (current) use of aspirin: Secondary | ICD-10-CM | POA: Diagnosis not present

## 2017-07-09 DIAGNOSIS — Z801 Family history of malignant neoplasm of trachea, bronchus and lung: Secondary | ICD-10-CM | POA: Insufficient documentation

## 2017-07-09 DIAGNOSIS — C3491 Malignant neoplasm of unspecified part of right bronchus or lung: Secondary | ICD-10-CM

## 2017-07-09 DIAGNOSIS — Z800 Family history of malignant neoplasm of digestive organs: Secondary | ICD-10-CM | POA: Diagnosis not present

## 2017-07-09 DIAGNOSIS — Z825 Family history of asthma and other chronic lower respiratory diseases: Secondary | ICD-10-CM | POA: Diagnosis not present

## 2017-07-09 DIAGNOSIS — E119 Type 2 diabetes mellitus without complications: Secondary | ICD-10-CM | POA: Insufficient documentation

## 2017-07-09 DIAGNOSIS — Z902 Acquired absence of lung [part of]: Secondary | ICD-10-CM | POA: Diagnosis not present

## 2017-07-09 DIAGNOSIS — E7800 Pure hypercholesterolemia, unspecified: Secondary | ICD-10-CM | POA: Insufficient documentation

## 2017-07-09 MED ORDER — BUPROPION HCL ER (SR) 150 MG PO TB12: ORAL_TABLET | ORAL | 2 refills | Status: AC

## 2017-07-09 MED ORDER — NICOTINE 21 MG/24HR TD PT24: MEDICATED_PATCH | TRANSDERMAL | 2 refills | Status: AC

## 2017-07-09 MED ORDER — NICOTINE 14 MG/24HR TD PT24: MEDICATED_PATCH | TRANSDERMAL | 0 refills | Status: AC

## 2017-07-09 MED ORDER — NICOTINE 7 MG/24HR TD PT24: MEDICATED_PATCH | TRANSDERMAL | 0 refills | Status: AC

## 2017-07-09 NOTE — Progress Notes (Signed)
Please see the Nurse Progress Note in the MD Initial Consult Encounter for this patient. 

## 2017-07-23 DIAGNOSIS — J449 Chronic obstructive pulmonary disease, unspecified: Secondary | ICD-10-CM | POA: Diagnosis not present

## 2017-07-23 DIAGNOSIS — E785 Hyperlipidemia, unspecified: Secondary | ICD-10-CM | POA: Diagnosis not present

## 2017-07-23 DIAGNOSIS — E11638 Type 2 diabetes mellitus with other oral complications: Secondary | ICD-10-CM | POA: Diagnosis not present

## 2017-07-23 DIAGNOSIS — I10 Essential (primary) hypertension: Secondary | ICD-10-CM | POA: Diagnosis not present

## 2017-08-06 DIAGNOSIS — M4722 Other spondylosis with radiculopathy, cervical region: Secondary | ICD-10-CM | POA: Diagnosis not present

## 2017-08-06 DIAGNOSIS — Z79891 Long term (current) use of opiate analgesic: Secondary | ICD-10-CM | POA: Diagnosis not present

## 2017-08-06 DIAGNOSIS — M47816 Spondylosis without myelopathy or radiculopathy, lumbar region: Secondary | ICD-10-CM | POA: Diagnosis not present

## 2017-08-06 DIAGNOSIS — M546 Pain in thoracic spine: Secondary | ICD-10-CM | POA: Diagnosis not present

## 2017-08-06 DIAGNOSIS — G894 Chronic pain syndrome: Secondary | ICD-10-CM | POA: Diagnosis not present

## 2017-08-18 DIAGNOSIS — H25812 Combined forms of age-related cataract, left eye: Secondary | ICD-10-CM | POA: Diagnosis not present

## 2017-08-18 DIAGNOSIS — Z01818 Encounter for other preprocedural examination: Secondary | ICD-10-CM | POA: Diagnosis not present

## 2017-09-03 DIAGNOSIS — G894 Chronic pain syndrome: Secondary | ICD-10-CM | POA: Diagnosis not present

## 2017-09-03 DIAGNOSIS — M47816 Spondylosis without myelopathy or radiculopathy, lumbar region: Secondary | ICD-10-CM | POA: Diagnosis not present

## 2017-09-03 DIAGNOSIS — M4722 Other spondylosis with radiculopathy, cervical region: Secondary | ICD-10-CM | POA: Diagnosis not present

## 2017-09-03 DIAGNOSIS — Z79891 Long term (current) use of opiate analgesic: Secondary | ICD-10-CM | POA: Diagnosis not present

## 2017-09-03 NOTE — Progress Notes (Signed)
Thoracic Location of Tumor / Histology: CT scan on 06/26/17 suspicious for metachronous primary of left apical pulmonary nodule.   Patient presented with symptoms of: He had a scheduled CT scan on 06/26/17 that showed stable disease of his Stage IB non-small cell lung cancer diagnosed 11/2014, but showed a slowly enlarging left apical pulmonary nodule suspicious for metachronous primary.  Biopsies of revealed: No biopsy done on recently observed disease.    Tobacco/Marijuana/Snuff/ETOH use: He is currently smoking. 1-2 cigarettes daily , no smokeles tobacco,alcohol or drug use  Past/Anticipated interventions by cardiothoracic surgery, if any:  Right upper lobectomy with lymph node dissection May 2016 by Dr. Servando Snare. None for most recent suspicious stage IA lung cancer.  Past/Anticipated interventions by medical oncology, if any:  07/02/17 Dr. Earlie Server Stage IB (T2a, N0, M0) non-small cell lung cancer, squamous cell carcinoma diagnosed in May 2016. The patient also has suspicious stage IA lung cancer in the left lower lobe. PRIOR THERAPY:  1) Status post right upper lobectomy with lymph node dissection in May 2016 under the care of Dr. Servando Snare. 2) Adjuvant systemic chemotherapy with carboplatin for AUC of 5 and paclitaxel 175 MG/M2 every 3 weeks. Status post 3 cycles. CURRENT THERAPY: Observation.  He had a repeat CT scan of the chest performed recently. I personally and independently reviewed the scan images and discussed the results with the patient and his family. Has a scan showed a stable disease except for slowly enlarging left apical pulmonary nodule suspicious for metachronous primary. I discussed with the patient and his family several options for management of his condition including close observation and monitoring with repeat imaging studies and 3-6 months versus referral to radiation oncology for consideration of stereotactic radiotherapy to this lesion. The patient and his  family are interested in seeing radiation oncology for discussion of this option. I will make the referral to radiation oncology and arrange for the patient to come back for follow-up visit in 6 months for evaluation with repeat CT scan of the chest for restaging of his disease.   Signs/Symptoms  Weight changes, if any: loss 13 lbs  Respiratory complaints, if any: SOB with exertion  Hemoptysis, if any:  no  Pain issues, if any: lower back down hips and leg, and neck  SAFETY ISSUES: no  Prior radiation? No  Pacemaker/ICD? No        Is the patient on methotrexate? No       Pregnant? N/A   Other complaints/ questions? Dr. Pearlie Oyster consult note from 07/09/17 I prescribed [name] Day with BuPropion, and nicotine patches to help with the quitting of smoking. I plan to follow up with [name] Day 6wks  from quit date and then consider a PET scan / PFTs for evaluation of his disease and  depending on his success in quitting tobacco  BP 138/65   Pulse (!) 104   Temp 99.1 F (37.3 C)   Ht 5\' 5"  (1.651 m)   Wt 185 lb 12.8 oz (84.3 kg)   SpO2 95% Comment: room air  BMI 30.92 kg/m    Wt Readings from Last 3 Encounters:  09/09/17 185 lb 12.8 oz (84.3 kg)  07/09/17 181 lb 9.6 oz (82.4 kg)  07/02/17 179 lb (81.2 kg)

## 2017-09-09 ENCOUNTER — Encounter: Payer: Self-pay | Admitting: Radiation Oncology

## 2017-09-09 ENCOUNTER — Ambulatory Visit
Admission: RE | Admit: 2017-09-09 | Discharge: 2017-09-09 | Disposition: A | Discharge status: Home / Self Care | Payer: Medicare Other | Source: Ambulatory Visit | Attending: Radiation Oncology | Admitting: Radiation Oncology

## 2017-09-09 VITALS — BP 138/65 | HR 104 | Temp 99.1°F | Ht 65.0 in | Wt 185.8 lb

## 2017-09-09 DIAGNOSIS — Z79899 Other long term (current) drug therapy: Secondary | ICD-10-CM | POA: Insufficient documentation

## 2017-09-09 DIAGNOSIS — M549 Dorsalgia, unspecified: Secondary | ICD-10-CM | POA: Diagnosis not present

## 2017-09-09 DIAGNOSIS — Z7982 Long term (current) use of aspirin: Secondary | ICD-10-CM | POA: Diagnosis not present

## 2017-09-09 DIAGNOSIS — Z7984 Long term (current) use of oral hypoglycemic drugs: Secondary | ICD-10-CM | POA: Diagnosis not present

## 2017-09-09 DIAGNOSIS — C3412 Malignant neoplasm of upper lobe, left bronchus or lung: Secondary | ICD-10-CM | POA: Diagnosis not present

## 2017-09-09 DIAGNOSIS — F17210 Nicotine dependence, cigarettes, uncomplicated: Secondary | ICD-10-CM | POA: Diagnosis not present

## 2017-09-09 DIAGNOSIS — R21 Rash and other nonspecific skin eruption: Secondary | ICD-10-CM | POA: Diagnosis not present

## 2017-09-09 DIAGNOSIS — M542 Cervicalgia: Secondary | ICD-10-CM | POA: Insufficient documentation

## 2017-09-09 NOTE — Progress Notes (Signed)
Radiation Oncology         [phone] ________________________________  Name: [name] Day MRN: [id number]  Date: 09/09/2017  DOB: [birth year]  Follow-Up Visit Note  Outpatient  CC: Greig Right, MD  Grace Isaac, MD  Diagnosis and Prior Radiotherapy:    ICD-10-CM   1. Cancer of upper lobe of left lung (HCC) C34.12     Stage IB (T2a, N0, M0) non-small cell lung cancer, squamous cell carcinoma diagnosed in May 2016. The patient also has suspicious stage IB lung cancer in the left upper lobe (see below)  Cancer Staging Cancer of upper lobe of left lung (HCC) Staging form: Lung, AJCC 8th Edition - Clinical: Stage IB (cT2a, cN0, cM0) - Signed by Eppie Gibson, MD on 07/09/2017  Non-small cell cancer of right lung [facility]) Staging form: Lung, AJCC 7th Edition - Pathologic stage from 12/01/2014: Stage IB (T2a, N0, cM0) - Signed by Grace Isaac, MD on 12/01/2014   CHIEF COMPLAINT: Here for follow-up and surveillance of lung cancer  Narrative:  The patient returns today for routine follow-up. He is accompanied by his daughter today and notes that he is doing well today. He has been followed by Medical Oncologist, Dr. Earlie Server with prior therapy consistent of: Status post right upper lobectomy with lymph node dissection in May 2016 under the care of Dr. Servando Snare. Adjuvant systemic chemotherapy with carboplatin for AUC of 5 and paclitaxel 175 MG/M2 every 3 weeks. Status post 3 cycles.  His last PFT was in May 2016 and his last appointment with Dr. Servando Snare was in August 2017. At that time, he was noted to be a poor candidate for surgery.   He reports that he has cut down a lot on his smoking and he now smokes a couple cigarettes a day. He reports that he hasn't used any nicotine patches or gum to help with smoking cessation. He reports that he has used his own discipline to aid with smoking cessation.  On review of systems, he reports constant back pain, constant neck pain, and  rashes to his bilateral forearms. He reports that he uses lots of lotion to aid with his rash. He reports that his fentanyl patch is prescribed by a pain management clinic. He denies SOB and any other symptoms.                                  ALLERGIES:  has No Known Allergies.  Meds: Current Outpatient Medications  Medication Sig Dispense Refill  . albuterol (PROVENTIL HFA;VENTOLIN HFA) 108 (90 Base) MCG/ACT inhaler Inhale 2 puffs into the lungs every 4 (four) hours as needed for wheezing or shortness of breath. For wheezing 1 Inhaler 5  . aspirin EC 81 MG tablet Take 1 tablet (81 mg total) by mouth daily.    Marland Kitchen buPROPion (WELLBUTRIN SR) 150 MG 12 hr tablet Start one week before quit date. Take 1 tab daily x 3 days, then 1 tab BID thereafter. 60 tablet 2  . Cholecalciferol 4000 UNITS CAPS Take 1 capsule by mouth daily.    . fentaNYL (DURAGESIC - DOSED MCG/HR) 75 MCG/HR Place 1 patch onto the skin every 3 (three) days.    Marland Kitchen glimepiride (AMARYL) 2 MG tablet Take 1 tablet (2 mg total) by mouth daily with breakfast. 30 tablet 2  . losartan-hydrochlorothiazide (HYZAAR) 100-12.5 MG per tablet Take 1 tablet by mouth daily.    . metFORMIN (GLUCOPHAGE-XR) 500  MG 24 hr tablet Take 500 mg by mouth 2 (two) times daily. 1 in am and 2 hs    . OXYCODONE HCL PO Take 20 mg by mouth every 4 (four) hours as needed.    . pravastatin (PRAVACHOL) 20 MG tablet Take 20 mg by mouth daily.     . SYMBICORT 160-4.5 MCG/ACT inhaler INHALE 2 PUFFS FIRST THING IN THE MORNING; AND THEN ANOTHER 2 PUFFS ABOUT 12 HOURS LATER. 10.2 g 3  . tiotropium (SPIRIVA HANDIHALER) 18 MCG inhalation capsule Place 1 capsule (18 mcg total) into inhaler and inhale daily. 30 capsule 5  . nicotine (NICODERM CQ - DOSED IN MG/24 HOURS) 14 mg/24hr patch Apply 21 mg patch daily x 6 wk, then 14mg  patch daily x 2 wk, then 7 mg patch daily x 2 wk (Patient not taking: Reported on 09/09/2017) 14 patch 0  . nicotine (NICODERM CQ - DOSED IN MG/24 HOURS) 21  mg/24hr patch Apply 21 mg patch daily x 6 wk, then 14mg  patch daily x 2 wk, then 7 mg patch daily x 2 wk (Patient not taking: Reported on 09/09/2017) 14 patch 2  . nicotine (NICODERM CQ - DOSED IN MG/24 HR) 7 mg/24hr patch Apply 21 mg patch daily x 6 wk, then 14mg  patch daily x 2 wk, then 7 mg patch daily x 2 wk (Patient not taking: Reported on 09/09/2017) 14 patch 0   No current facility-administered medications for this encounter.     Physical Findings: The patient is in no acute distress. Patient is alert and oriented.  height is 5\' 5"  (1.651 m) and weight is 185 lb 12.8 oz (84.3 kg). His temperature is 99.1 F (37.3 C). His blood pressure is 138/65 and his pulse is 104 (abnormal). His oxygen saturation is 95%. .    General: Alert and oriented, in no acute distress Heart: Regular in rate and rhythm with no murmurs, rubs, or gallops. Chest: Chest sounds are coarse auscultation bilaterally Skin: Multiple macular moles over his forearm and chest and back with dry skin particularly over his arms/legs Neurologic: Cranial nerves II through XII are grossly intact. No obvious focalities. Speech is fluent. Coordination is intact. Psychiatric: Judgment and insight are intact. Affect is appropriate.    Lab Findings: Lab Results  Component Value Date   WBC 8.3 06/26/2017   HGB 16.5 06/26/2017   HCT 50.4 (H) 06/26/2017   MCV 96.6 06/26/2017   PLT 192 06/26/2017    Radiographic Findings: No results found.  Impression/Plan:  Left upper lobe lung lesion with gradual mild growth, smoker  This lesion is relatively indolent.  I am more concerned by his smoking that this lesion.  The patient continues to use tobacco but was applauded for cutting back significantly. The patient was counseled to stop using tobacco and was offered pharmacotherapy and further counseling to help with this. The patient declined pharmacotherapy and further counseling at this time. Advised the patient and his granddaughter  regarding 1-800-QUITNOW to aid with smoking cessation. Specific quit date wasn't established, but he will call me if he goes 1 month without smoking any cigarettes. If that is the case, we can seriously consider moving forward with radiation planning. He understands that radiation poses a risk to the normal tissues including the lungs and the aorta/great vessels. He understands that smoking during radiation would increase these risks.   If he doesn't quit smoking in the next 4 months, then I will be available to see him if his next scan warrants  me doing so. I will let Dr. Julien Nordmann know that we could consider SBRT if the lesions starts to behave aggressively. However at this time, I would not say that the benefits outweigh the risk given that the lesion is relatively indolent and given that he is currently smoking - again, smoking during treatment would increase the risks of SBRT.   At his previous appointment with Dr. Servando Snare, the patient was not considered a good surgical candidate, given his functional status and lung disease.   He will follow up with Dr. Julien Nordmann in 4 months and PRN follow up PRN with Radiation Oncology if the next scan warrants or the patient is able to quit smoking for 1 month.  Pt understands PFTs would need to be repeated before SBRT.  I spent 30 minutes face to face with the patient and more than 50% of that time was spent in counseling and/or coordination of care. _____________________________________   Eppie Gibson, MD  This document serves as a record of services personally performed by Eppie Gibson, MD. It was created on her behalf by Steva Colder, a trained medical scribe. The creation of this record is based on the scribe's personal observations and the provider's statements to them. This document has been checked and approved by the attending provider.

## 2017-09-11 ENCOUNTER — Encounter: Payer: Self-pay | Admitting: Radiation Oncology

## 2017-09-21 DIAGNOSIS — E119 Type 2 diabetes mellitus without complications: Secondary | ICD-10-CM | POA: Diagnosis not present

## 2017-09-21 DIAGNOSIS — H2512 Age-related nuclear cataract, left eye: Secondary | ICD-10-CM | POA: Diagnosis not present

## 2017-09-21 DIAGNOSIS — H25812 Combined forms of age-related cataract, left eye: Secondary | ICD-10-CM | POA: Diagnosis not present

## 2017-09-21 DIAGNOSIS — H5704 Mydriasis: Secondary | ICD-10-CM | POA: Diagnosis not present

## 2017-10-01 DIAGNOSIS — M47816 Spondylosis without myelopathy or radiculopathy, lumbar region: Secondary | ICD-10-CM | POA: Diagnosis not present

## 2017-10-01 DIAGNOSIS — M48062 Spinal stenosis, lumbar region with neurogenic claudication: Secondary | ICD-10-CM | POA: Diagnosis not present

## 2017-10-01 DIAGNOSIS — M4722 Other spondylosis with radiculopathy, cervical region: Secondary | ICD-10-CM | POA: Diagnosis not present

## 2017-10-01 DIAGNOSIS — G894 Chronic pain syndrome: Secondary | ICD-10-CM | POA: Diagnosis not present

## 2017-11-02 DIAGNOSIS — M4722 Other spondylosis with radiculopathy, cervical region: Secondary | ICD-10-CM | POA: Diagnosis not present

## 2017-11-02 DIAGNOSIS — M48061 Spinal stenosis, lumbar region without neurogenic claudication: Secondary | ICD-10-CM | POA: Diagnosis not present

## 2017-11-02 DIAGNOSIS — M47816 Spondylosis without myelopathy or radiculopathy, lumbar region: Secondary | ICD-10-CM | POA: Diagnosis not present

## 2017-11-02 DIAGNOSIS — G894 Chronic pain syndrome: Secondary | ICD-10-CM | POA: Diagnosis not present

## 2017-11-05 DIAGNOSIS — R404 Transient alteration of awareness: Secondary | ICD-10-CM | POA: Diagnosis not present

## 2017-11-05 DIAGNOSIS — R531 Weakness: Secondary | ICD-10-CM | POA: Diagnosis not present

## 2017-11-23 DIAGNOSIS — I10 Essential (primary) hypertension: Secondary | ICD-10-CM | POA: Diagnosis not present

## 2017-11-23 DIAGNOSIS — E11638 Type 2 diabetes mellitus with other oral complications: Secondary | ICD-10-CM | POA: Diagnosis not present

## 2017-11-23 DIAGNOSIS — J449 Chronic obstructive pulmonary disease, unspecified: Secondary | ICD-10-CM | POA: Diagnosis not present

## 2017-11-23 DIAGNOSIS — E785 Hyperlipidemia, unspecified: Secondary | ICD-10-CM | POA: Diagnosis not present

## 2017-11-30 DIAGNOSIS — M47816 Spondylosis without myelopathy or radiculopathy, lumbar region: Secondary | ICD-10-CM | POA: Diagnosis not present

## 2017-11-30 DIAGNOSIS — M4722 Other spondylosis with radiculopathy, cervical region: Secondary | ICD-10-CM | POA: Diagnosis not present

## 2017-11-30 DIAGNOSIS — Z79891 Long term (current) use of opiate analgesic: Secondary | ICD-10-CM | POA: Diagnosis not present

## 2017-11-30 DIAGNOSIS — G894 Chronic pain syndrome: Secondary | ICD-10-CM | POA: Diagnosis not present

## 2017-12-28 ENCOUNTER — Inpatient Hospital Stay: Payer: Medicare Other | Attending: Internal Medicine

## 2017-12-28 ENCOUNTER — Ambulatory Visit (HOSPITAL_COMMUNITY)
Admission: RE | Admit: 2017-12-28 | Discharge: 2017-12-28 | Disposition: A | Discharge status: Home / Self Care | Payer: Medicare Other | Source: Ambulatory Visit | Attending: Internal Medicine | Admitting: Internal Medicine

## 2017-12-28 ENCOUNTER — Encounter (HOSPITAL_COMMUNITY): Payer: Self-pay

## 2017-12-28 DIAGNOSIS — I2584 Coronary atherosclerosis due to calcified coronary lesion: Secondary | ICD-10-CM | POA: Insufficient documentation

## 2017-12-28 DIAGNOSIS — C3490 Malignant neoplasm of unspecified part of unspecified bronchus or lung: Secondary | ICD-10-CM

## 2017-12-28 DIAGNOSIS — J8410 Pulmonary fibrosis, unspecified: Secondary | ICD-10-CM | POA: Insufficient documentation

## 2017-12-28 DIAGNOSIS — I700 Atherosclerosis of aorta: Secondary | ICD-10-CM | POA: Diagnosis not present

## 2017-12-28 DIAGNOSIS — J439 Emphysema, unspecified: Secondary | ICD-10-CM | POA: Insufficient documentation

## 2017-12-28 DIAGNOSIS — C3412 Malignant neoplasm of upper lobe, left bronchus or lung: Secondary | ICD-10-CM | POA: Insufficient documentation

## 2017-12-28 DIAGNOSIS — I2510 Atherosclerotic heart disease of native coronary artery without angina pectoris: Secondary | ICD-10-CM | POA: Diagnosis not present

## 2017-12-28 LAB — CBC WITH DIFFERENTIAL/PLATELET
BASOS PCT: 0 %
Basophils Absolute: 0.1 10*3/uL (ref 0.0–0.1)
Eosinophils Absolute: 0.6 10*3/uL — ABNORMAL HIGH (ref 0.0–0.5)
Eosinophils Relative: 5 %
HEMATOCRIT: 47.3 % (ref 38.4–49.9)
Hemoglobin: 16.2 g/dL (ref 13.0–17.1)
Lymphocytes Relative: 21 %
Lymphs Abs: 2.4 10*3/uL (ref 0.9–3.3)
MCH: 32.8 pg (ref 27.2–33.4)
MCHC: 34.2 g/dL (ref 32.0–36.0)
MCV: 95.7 fL (ref 79.3–98.0)
MONOS PCT: 7 %
Monocytes Absolute: 0.8 10*3/uL (ref 0.1–0.9)
NEUTROS ABS: 7.9 10*3/uL — AB (ref 1.5–6.5)
NEUTROS PCT: 67 %
Platelets: 264 10*3/uL (ref 140–400)
RBC: 4.94 MIL/uL (ref 4.20–5.82)
RDW: 13.3 % (ref 11.0–14.6)
WBC: 11.8 10*3/uL — AB (ref 4.0–10.3)

## 2017-12-28 LAB — COMPREHENSIVE METABOLIC PANEL
ALK PHOS: 84 U/L (ref 40–150)
ALT: 11 U/L (ref 0–55)
ANION GAP: 11 (ref 3–11)
AST: 19 U/L (ref 5–34)
Albumin: 4 g/dL (ref 3.5–5.0)
BUN: 12 mg/dL (ref 7–26)
CALCIUM: 10 mg/dL (ref 8.4–10.4)
CHLORIDE: 104 mmol/L (ref 98–109)
CO2: 25 mmol/L (ref 22–29)
Creatinine, Ser: 0.99 mg/dL (ref 0.70–1.30)
Glucose, Bld: 185 mg/dL — ABNORMAL HIGH (ref 70–140)
Potassium: 3.7 mmol/L (ref 3.5–5.1)
SODIUM: 140 mmol/L (ref 136–145)
Total Bilirubin: 0.5 mg/dL (ref 0.2–1.2)
Total Protein: 7.6 g/dL (ref 6.4–8.3)

## 2017-12-28 MED ORDER — IOHEXOL 300 MG/ML  SOLN
75.0000 mL | Freq: Once | INTRAMUSCULAR | Status: AC | PRN
  Administered 2017-12-28: 75 mL via INTRAVENOUS

## 2017-12-29 DIAGNOSIS — M47816 Spondylosis without myelopathy or radiculopathy, lumbar region: Secondary | ICD-10-CM | POA: Diagnosis not present

## 2017-12-29 DIAGNOSIS — M4722 Other spondylosis with radiculopathy, cervical region: Secondary | ICD-10-CM | POA: Diagnosis not present

## 2017-12-29 DIAGNOSIS — M48062 Spinal stenosis, lumbar region with neurogenic claudication: Secondary | ICD-10-CM | POA: Diagnosis not present

## 2017-12-29 DIAGNOSIS — G894 Chronic pain syndrome: Secondary | ICD-10-CM | POA: Diagnosis not present

## 2018-01-26 DIAGNOSIS — M47816 Spondylosis without myelopathy or radiculopathy, lumbar region: Secondary | ICD-10-CM | POA: Diagnosis not present

## 2018-01-26 DIAGNOSIS — G894 Chronic pain syndrome: Secondary | ICD-10-CM | POA: Diagnosis not present

## 2018-01-26 DIAGNOSIS — M48062 Spinal stenosis, lumbar region with neurogenic claudication: Secondary | ICD-10-CM | POA: Diagnosis not present

## 2018-01-26 DIAGNOSIS — M4722 Other spondylosis with radiculopathy, cervical region: Secondary | ICD-10-CM | POA: Diagnosis not present

## 2018-02-02 ENCOUNTER — Other Ambulatory Visit: Payer: Self-pay

## 2018-02-02 NOTE — Patient Outreach (Signed)
[place] [facility]) Care Management  02/02/2018  [name] Day [birth year] [id number]   Medication Adherence call to [name] Day spoke with patient he explain he is taking both medication Losartan 50 mg and Metformin Er 500 mg but, the pharmacy send him double order and he still has medication until the end of August. [name] Day is showing past due under [place].  [place] Management Direct Dial [phone]  Fax [phone] [email or web address]

## 2018-02-25 DIAGNOSIS — M48061 Spinal stenosis, lumbar region without neurogenic claudication: Secondary | ICD-10-CM | POA: Diagnosis not present

## 2018-02-25 DIAGNOSIS — M47816 Spondylosis without myelopathy or radiculopathy, lumbar region: Secondary | ICD-10-CM | POA: Diagnosis not present

## 2018-02-25 DIAGNOSIS — M4722 Other spondylosis with radiculopathy, cervical region: Secondary | ICD-10-CM | POA: Diagnosis not present

## 2018-02-25 DIAGNOSIS — G894 Chronic pain syndrome: Secondary | ICD-10-CM | POA: Diagnosis not present

## 2018-03-25 DIAGNOSIS — M48062 Spinal stenosis, lumbar region with neurogenic claudication: Secondary | ICD-10-CM | POA: Diagnosis not present

## 2018-03-25 DIAGNOSIS — G894 Chronic pain syndrome: Secondary | ICD-10-CM | POA: Diagnosis not present

## 2018-03-25 DIAGNOSIS — M4722 Other spondylosis with radiculopathy, cervical region: Secondary | ICD-10-CM | POA: Diagnosis not present

## 2018-03-25 DIAGNOSIS — M47816 Spondylosis without myelopathy or radiculopathy, lumbar region: Secondary | ICD-10-CM | POA: Diagnosis not present

## 2018-04-22 DIAGNOSIS — M48062 Spinal stenosis, lumbar region with neurogenic claudication: Secondary | ICD-10-CM | POA: Diagnosis not present

## 2018-04-22 DIAGNOSIS — G894 Chronic pain syndrome: Secondary | ICD-10-CM | POA: Diagnosis not present

## 2018-04-22 DIAGNOSIS — M47816 Spondylosis without myelopathy or radiculopathy, lumbar region: Secondary | ICD-10-CM | POA: Diagnosis not present

## 2018-04-22 DIAGNOSIS — M4722 Other spondylosis with radiculopathy, cervical region: Secondary | ICD-10-CM | POA: Diagnosis not present

## 2018-05-20 DIAGNOSIS — G894 Chronic pain syndrome: Secondary | ICD-10-CM | POA: Diagnosis not present

## 2018-05-20 DIAGNOSIS — M4722 Other spondylosis with radiculopathy, cervical region: Secondary | ICD-10-CM | POA: Diagnosis not present

## 2018-05-20 DIAGNOSIS — M47816 Spondylosis without myelopathy or radiculopathy, lumbar region: Secondary | ICD-10-CM | POA: Diagnosis not present

## 2018-05-20 DIAGNOSIS — M48062 Spinal stenosis, lumbar region with neurogenic claudication: Secondary | ICD-10-CM | POA: Diagnosis not present

## 2018-06-17 DIAGNOSIS — M47816 Spondylosis without myelopathy or radiculopathy, lumbar region: Secondary | ICD-10-CM | POA: Diagnosis not present

## 2018-06-17 DIAGNOSIS — G894 Chronic pain syndrome: Secondary | ICD-10-CM | POA: Diagnosis not present

## 2018-06-17 DIAGNOSIS — M4722 Other spondylosis with radiculopathy, cervical region: Secondary | ICD-10-CM | POA: Diagnosis not present

## 2018-06-17 DIAGNOSIS — M48062 Spinal stenosis, lumbar region with neurogenic claudication: Secondary | ICD-10-CM | POA: Diagnosis not present

## 2018-07-15 ENCOUNTER — Other Ambulatory Visit: Payer: Self-pay

## 2018-07-15 DIAGNOSIS — M4722 Other spondylosis with radiculopathy, cervical region: Secondary | ICD-10-CM | POA: Diagnosis not present

## 2018-07-15 DIAGNOSIS — M47816 Spondylosis without myelopathy or radiculopathy, lumbar region: Secondary | ICD-10-CM | POA: Diagnosis not present

## 2018-07-15 DIAGNOSIS — G894 Chronic pain syndrome: Secondary | ICD-10-CM | POA: Diagnosis not present

## 2018-07-15 DIAGNOSIS — M48062 Spinal stenosis, lumbar region with neurogenic claudication: Secondary | ICD-10-CM | POA: Diagnosis not present

## 2018-07-15 NOTE — Patient Outreach (Signed)
[place] [facility]) Care Management  07/15/2018  [name] Day 1946-01-16 [id number]   Medication Adherence call to [name] Day left a message for patient to call back patient is due on Glimepiride 2 mg. Metformin ER 500 mg and Losartan 100 mg. [name] Day is showing past due under [place] [employer] care Ins.  [place] Management Direct Dial [phone]  Fax [phone] [email or web address]

## 2018-08-13 DIAGNOSIS — M4722 Other spondylosis with radiculopathy, cervical region: Secondary | ICD-10-CM | POA: Diagnosis not present

## 2018-08-13 DIAGNOSIS — M48062 Spinal stenosis, lumbar region with neurogenic claudication: Secondary | ICD-10-CM | POA: Diagnosis not present

## 2018-08-13 DIAGNOSIS — M5412 Radiculopathy, cervical region: Secondary | ICD-10-CM | POA: Diagnosis not present

## 2018-08-13 DIAGNOSIS — M47816 Spondylosis without myelopathy or radiculopathy, lumbar region: Secondary | ICD-10-CM | POA: Diagnosis not present

## 2018-09-10 DIAGNOSIS — M5412 Radiculopathy, cervical region: Secondary | ICD-10-CM | POA: Diagnosis not present

## 2018-09-10 DIAGNOSIS — M48062 Spinal stenosis, lumbar region with neurogenic claudication: Secondary | ICD-10-CM | POA: Diagnosis not present

## 2018-09-10 DIAGNOSIS — M4722 Other spondylosis with radiculopathy, cervical region: Secondary | ICD-10-CM | POA: Diagnosis not present

## 2018-09-10 DIAGNOSIS — M47816 Spondylosis without myelopathy or radiculopathy, lumbar region: Secondary | ICD-10-CM | POA: Diagnosis not present

## 2018-11-05 DIAGNOSIS — M47816 Spondylosis without myelopathy or radiculopathy, lumbar region: Secondary | ICD-10-CM | POA: Diagnosis not present

## 2018-11-05 DIAGNOSIS — M4722 Other spondylosis with radiculopathy, cervical region: Secondary | ICD-10-CM | POA: Diagnosis not present

## 2018-11-05 DIAGNOSIS — M5412 Radiculopathy, cervical region: Secondary | ICD-10-CM | POA: Diagnosis not present

## 2018-11-05 DIAGNOSIS — M48061 Spinal stenosis, lumbar region without neurogenic claudication: Secondary | ICD-10-CM | POA: Diagnosis not present

## 2018-11-05 DIAGNOSIS — G894 Chronic pain syndrome: Secondary | ICD-10-CM | POA: Diagnosis not present

## 2018-11-16 ENCOUNTER — Other Ambulatory Visit: Payer: Self-pay

## 2018-11-16 NOTE — Patient Outreach (Signed)
[place] [facility]) Care Management  11/16/2018  MERCEDES VALERIANO [birth year] [id number]   Medication Adherence call to Mr. Omarrion Carmer patient did not answer patient is due on Atorvastatin,Metformin and Glimepiride under [place].   [place] Management Direct Dial [phone]  Fax [phone] [email or web address]

## 2018-12-03 DIAGNOSIS — G894 Chronic pain syndrome: Secondary | ICD-10-CM | POA: Diagnosis not present

## 2018-12-03 DIAGNOSIS — M48062 Spinal stenosis, lumbar region with neurogenic claudication: Secondary | ICD-10-CM | POA: Diagnosis not present

## 2018-12-03 DIAGNOSIS — M4722 Other spondylosis with radiculopathy, cervical region: Secondary | ICD-10-CM | POA: Diagnosis not present

## 2018-12-03 DIAGNOSIS — M47816 Spondylosis without myelopathy or radiculopathy, lumbar region: Secondary | ICD-10-CM | POA: Diagnosis not present

## 2018-12-03 DIAGNOSIS — M5412 Radiculopathy, cervical region: Secondary | ICD-10-CM | POA: Diagnosis not present

## 2018-12-13 ENCOUNTER — Other Ambulatory Visit: Payer: Self-pay

## 2018-12-13 NOTE — Patient Outreach (Signed)
[place] [facility]) Care Management  12/13/2018  [name] Day 1945-10-01 [id number]   Medication Adherence call to Mr. [name] Day [facility] [employer] spoke with patient wife she explain patient has medication for 3 more month at some point patient received a duplication on his medication and end up with six month worth patient has plenty at this time. [name] is showing past due on Atorvastatin 40 mg,Metformin Er 500 and Glimeperide 2 mg under [place].   [place] Management Direct Dial [phone]  Fax [phone] [name]

## 2018-12-31 DIAGNOSIS — M4722 Other spondylosis with radiculopathy, cervical region: Secondary | ICD-10-CM | POA: Diagnosis not present

## 2018-12-31 DIAGNOSIS — M47816 Spondylosis without myelopathy or radiculopathy, lumbar region: Secondary | ICD-10-CM | POA: Diagnosis not present

## 2018-12-31 DIAGNOSIS — G894 Chronic pain syndrome: Secondary | ICD-10-CM | POA: Diagnosis not present

## 2018-12-31 DIAGNOSIS — M48062 Spinal stenosis, lumbar region with neurogenic claudication: Secondary | ICD-10-CM | POA: Diagnosis not present

## 2018-12-31 DIAGNOSIS — M5412 Radiculopathy, cervical region: Secondary | ICD-10-CM | POA: Diagnosis not present

## 2019-01-03 DIAGNOSIS — E162 Hypoglycemia, unspecified: Secondary | ICD-10-CM | POA: Diagnosis not present

## 2019-01-03 DIAGNOSIS — E161 Other hypoglycemia: Secondary | ICD-10-CM | POA: Diagnosis not present

## 2019-01-03 DIAGNOSIS — R531 Weakness: Secondary | ICD-10-CM | POA: Diagnosis not present

## 2019-01-03 DIAGNOSIS — R61 Generalized hyperhidrosis: Secondary | ICD-10-CM | POA: Diagnosis not present

## 2019-01-03 DIAGNOSIS — E11649 Type 2 diabetes mellitus with hypoglycemia without coma: Secondary | ICD-10-CM | POA: Diagnosis not present

## 2019-01-10 DIAGNOSIS — C3492 Malignant neoplasm of unspecified part of left bronchus or lung: Secondary | ICD-10-CM | POA: Diagnosis not present

## 2019-01-10 DIAGNOSIS — I10 Essential (primary) hypertension: Secondary | ICD-10-CM | POA: Diagnosis not present

## 2019-01-10 DIAGNOSIS — E119 Type 2 diabetes mellitus without complications: Secondary | ICD-10-CM | POA: Diagnosis not present

## 2019-01-10 DIAGNOSIS — E785 Hyperlipidemia, unspecified: Secondary | ICD-10-CM | POA: Diagnosis not present

## 2019-01-11 ENCOUNTER — Other Ambulatory Visit: Payer: Self-pay | Admitting: *Deleted

## 2019-01-11 ENCOUNTER — Encounter: Payer: Self-pay | Admitting: *Deleted

## 2019-01-11 NOTE — Patient Outreach (Signed)
[place] [facility]) Care Management  01/11/2019  [name] Day [birth year] [id number]   Telephone Screen  Referral Date: 01/10/19 Referral Source: [facility] referral  Referral Reason: Member was a seen in ER on 01/04/19 for low blood sugar During our ER phone call member voiced that he needs assistance with his dentures and eyeglasses. Please follow up with any available resources  [name] Day 01/06/19  Insurance: united health care medicare     Outreach attempt # 1 successful  To his home number  Patient is able to verify HIPAA He also gives permission for CM to speak with his daughter, [name] Day  Reviewed and addressed referral to [facility] with patient   [name] Day confirms the need for dentures and glasses per the referral  He reports his main concern is that he can not afford to get new glasses and dentures He presently has reading glasses that are being held together with pipe cleaners and a lens is gone. He nor his daughter, [name] Day can not recall the last time he got glasses or what provider was seen. He reports it was a long time ago    CM inquired if [name] Day was aware He updated CM that he does not see [name] Day any longer but is now being seen by [name] Rainbow, NP at [address] [phone] He reports he was seen on 01/10/19    RN CM assessed if he had medicaid services and he confirms he does not RN CM provided a list of optometrists (myeedr, Paediatric nurse) and dentists (simoncic, menius from the united healthcare web site using the home zip code [phone]. [name] Day wrote these providers down. CM encouraged contact to the local [employer], local churches, DSS and the local senior center for possible resources and provided contact information.   [name] Day voices that [name] Day has lost about 40 pounds, sleeps more, has a fair appetite, was informed to stop his HTN and DM medicines to prevent hypotension and hypoglycema episdoes THN RN CM discussed [facility] services for [place] and health coach. They refused the offer of [name] Day community RN CM and/or health coach RN CM services They state they already receive annual united healthcare RN visits and that is all that is needed even after CM discussed the differences.    Social: [name] Day lives with his daughter, [name] Day. He is independent/assist with his care needs He does not have issues with transportation to medical appointment   Conditions: COPD GOLD II, Non  Small cell cancer of right lung, Cancer of upper left lung lobe s/p lobectomy, DM, Tobacco abuse centrilobular emphysema, hx of PNA,    DME: cbg monitor  Medications: States he is receiving  3 months of refills at a time denies concerns with taking medications as prescribed, affording medications, side effects of medications and questions about medications   Advance Directives: Denies need for assist with advance directives   Consent: [facility] RN CM reviewed [name] services with patient. Patient gave verbal consent for services [name] Day telephonic RN CM and [facility] .   Plan: [name] RN CM will refer [name] Shedd to [name] Day SW for assistance with any other possible community and financial resources for the cost eyeglasses and dentures other than those mentioned by RN CM [employer], DSS, local senior center)  [facility] RN CM will close case the [name] Day telephonic encounter at this time  .   Pt encouraged to return a call to [facility]  RN CM prn  [facility]N RN CM sent a successful outreach letter as discussed with [facility] brochure enclosed for review   [name] Glomski L. Lavina Hamman, RN, BSN, [place] Coordinator Office number [phone] Mobile number [phone]  Main [facility] number [phone] Fax number [phone]

## 2019-01-17 ENCOUNTER — Other Ambulatory Visit: Payer: Self-pay

## 2019-01-17 NOTE — Patient Outreach (Signed)
[place] [facility]) Care Management  01/17/2019  JAMARKUS LISBON 1945/11/10 [id number]   Social work referral from [place]  "Patient needs assistance with any other possible community and financial resources for the cost eyeglasses and dentures other than those mentioned by RN CM [employer], DSS, [employer] geologist center)" Unsuccessful outreach today.  Unable to leave message at home or mobile numbers.  Unsuccessful outreach letter mailed.  Will attempt to reach again within four business days.  Ronn Melena, BSW Social Worker [phone]

## 2019-01-20 ENCOUNTER — Other Ambulatory Visit: Payer: Self-pay

## 2019-01-20 ENCOUNTER — Ambulatory Visit: Payer: Self-pay

## 2019-01-20 NOTE — Patient Outreach (Signed)
[place] [facility]) Care Management  01/20/2019  [name] Day 12/15/1945 [id number]   Social work referral from [place]  "Patient needs assistance with any other possible community and financial resources for the cost eyeglasses and dentures other than those mentioned by RN CM [employer], DSS, local senior center)" Patient was not home at time of call; was asked to call back this afternoon.  Unsuccessful outreach this afternoon.  Unable to leave message at home or mobile numbers.  Unsuccessful outreach letter mailed on 01/17/19.  Will attempt to reach again within four business days.  Ronn Melena, BSW Social Worker [phone]

## 2019-01-24 ENCOUNTER — Other Ambulatory Visit: Payer: Self-pay

## 2019-01-24 ENCOUNTER — Ambulatory Visit: Payer: Self-pay

## 2019-01-24 NOTE — Patient Outreach (Signed)
[place] [facility]) Care Management  01/24/2019  MURLIN SCHRIEBER [birth year] [id number]    Social work referral from [place]  "Patient needs assistance with any other possible community and financial resources for the cost eyeglasses and dentures other than those mentioned by RN CM [employer], DSS, [employer] center)" Third unsuccessful outreach today.  Unable to leave message at home or mobile numbers.  Unsuccessful outreach letter mailed on 01/17/19.  Will close case if no response by 01/28/19.  Ronn Melena, BSW Social Worker [phone]

## 2019-01-28 ENCOUNTER — Other Ambulatory Visit: Payer: Self-pay

## 2019-01-28 DIAGNOSIS — M4722 Other spondylosis with radiculopathy, cervical region: Secondary | ICD-10-CM | POA: Diagnosis not present

## 2019-01-28 DIAGNOSIS — M48062 Spinal stenosis, lumbar region with neurogenic claudication: Secondary | ICD-10-CM | POA: Diagnosis not present

## 2019-01-28 DIAGNOSIS — M5412 Radiculopathy, cervical region: Secondary | ICD-10-CM | POA: Diagnosis not present

## 2019-01-28 DIAGNOSIS — M47816 Spondylosis without myelopathy or radiculopathy, lumbar region: Secondary | ICD-10-CM | POA: Diagnosis not present

## 2019-01-28 NOTE — Patient Outreach (Signed)
[place] [facility]) Care Management  01/28/2019  [name] Day 11/24/1945 [id number]   [facility] BSW closing case due to inability to contact.  Ronn Melena, BSW Social Worker [phone]

## 2019-02-14 DIAGNOSIS — C3492 Malignant neoplasm of unspecified part of left bronchus or lung: Secondary | ICD-10-CM | POA: Diagnosis not present

## 2019-02-14 DIAGNOSIS — Z0001 Encounter for general adult medical examination with abnormal findings: Secondary | ICD-10-CM | POA: Diagnosis not present

## 2019-02-25 DIAGNOSIS — M48062 Spinal stenosis, lumbar region with neurogenic claudication: Secondary | ICD-10-CM | POA: Diagnosis not present

## 2019-02-25 DIAGNOSIS — M4722 Other spondylosis with radiculopathy, cervical region: Secondary | ICD-10-CM | POA: Diagnosis not present

## 2019-02-25 DIAGNOSIS — M5412 Radiculopathy, cervical region: Secondary | ICD-10-CM | POA: Diagnosis not present

## 2019-02-25 DIAGNOSIS — M47816 Spondylosis without myelopathy or radiculopathy, lumbar region: Secondary | ICD-10-CM | POA: Diagnosis not present

## 2019-03-25 DIAGNOSIS — M47816 Spondylosis without myelopathy or radiculopathy, lumbar region: Secondary | ICD-10-CM | POA: Diagnosis not present

## 2019-03-25 DIAGNOSIS — M4722 Other spondylosis with radiculopathy, cervical region: Secondary | ICD-10-CM | POA: Diagnosis not present

## 2019-03-25 DIAGNOSIS — M48062 Spinal stenosis, lumbar region with neurogenic claudication: Secondary | ICD-10-CM | POA: Diagnosis not present

## 2019-03-25 DIAGNOSIS — G894 Chronic pain syndrome: Secondary | ICD-10-CM | POA: Diagnosis not present

## 2019-04-12 ENCOUNTER — Other Ambulatory Visit: Payer: Self-pay | Admitting: Radiation Oncology

## 2019-04-12 DIAGNOSIS — K769 Liver disease, unspecified: Secondary | ICD-10-CM

## 2019-04-12 DIAGNOSIS — R911 Solitary pulmonary nodule: Secondary | ICD-10-CM

## 2019-04-13 ENCOUNTER — Telehealth: Payer: Self-pay | Admitting: *Deleted

## 2019-04-13 NOTE — Telephone Encounter (Signed)
CALLED PATIENT TO INFORM OF STAT LABS FOR 04-19-19 @ 3:15 PM @ [place] AND HIS CT FOR 04-19-19 - ARRIVAL TIME- 4:15 PM @ [facility], PT. TO BE NPO- 4 HRS. PRIOR TO TEST AND PATIENT TO PICK-UP PREP ON 04-18-19, VM FULL UNABLE TO LEAVE MESSAGE WILL CALL LATER.

## 2019-04-15 ENCOUNTER — Telehealth: Payer: Self-pay | Admitting: *Deleted

## 2019-04-15 NOTE — Telephone Encounter (Signed)
CALLED PATIENT TO INFORM OF CT FOR 04-19-19 - ARRIVAL TIME- 4:15 PM @ [facility], PATIENT TO BE NPO- 4 HRS. PRIOR TO TEST, PATIENT TO PICK-UP DRINK IN RADIOLOGY FOR SCAN ON 04-18-19 AND PATIENT TO RECEIVE RESULTS ON 04-20-19 FROM DR. SQUIRE, SPOKE WITH PATIENT'S WIFE - AMY AND SHE IS AWARE OF THESE APPTS.

## 2019-04-15 NOTE — Telephone Encounter (Signed)
CALLED PATIENT TO INFORM OF STAT LABS ON 04-19-19 @ 3:15 PM @ [place], SPOKE WITH PATIENT'S WIFE - AMY AND SHE IS AWARE OF THIS APPT.

## 2019-04-19 ENCOUNTER — Ambulatory Visit (HOSPITAL_COMMUNITY)
Admission: RE | Admit: 2019-04-19 | Discharge: 2019-04-19 | Disposition: A | Discharge status: Home / Self Care | Payer: Medicare Other | Source: Ambulatory Visit | Attending: Radiation Oncology | Admitting: Radiation Oncology

## 2019-04-19 ENCOUNTER — Ambulatory Visit
Admission: RE | Admit: 2019-04-19 | Discharge: 2019-04-19 | Disposition: A | Discharge status: Home / Self Care | Payer: Medicare Other | Source: Ambulatory Visit | Attending: Radiation Oncology | Admitting: Radiation Oncology

## 2019-04-19 ENCOUNTER — Encounter (HOSPITAL_COMMUNITY): Payer: Self-pay

## 2019-04-19 ENCOUNTER — Other Ambulatory Visit: Payer: Self-pay

## 2019-04-19 DIAGNOSIS — Z923 Personal history of irradiation: Secondary | ICD-10-CM | POA: Insufficient documentation

## 2019-04-19 DIAGNOSIS — C3412 Malignant neoplasm of upper lobe, left bronchus or lung: Secondary | ICD-10-CM | POA: Insufficient documentation

## 2019-04-19 DIAGNOSIS — R911 Solitary pulmonary nodule: Secondary | ICD-10-CM

## 2019-04-19 DIAGNOSIS — K769 Liver disease, unspecified: Secondary | ICD-10-CM

## 2019-04-19 LAB — BUN & CREATININE (CHCC)
BUN: 18 mg/dL (ref 8–23)
Creatinine: 0.81 mg/dL (ref 0.61–1.24)
GFR, Est AFR Am: >60 mL/min (ref >60)
GFR, Estimated: >60 mL/min (ref >60)

## 2019-04-19 MED ORDER — SODIUM CHLORIDE (PF) 0.9 % IJ SOLN
INTRAMUSCULAR | Status: AC
  Filled 2019-04-19: qty 50

## 2019-04-20 ENCOUNTER — Ambulatory Visit
Admission: RE | Admit: 2019-04-20 | Discharge: 2019-04-20 | Disposition: A | Discharge status: Home / Self Care | Payer: Medicare Other | Source: Ambulatory Visit | Attending: Radiation Oncology | Admitting: Radiation Oncology

## 2019-04-20 ENCOUNTER — Telehealth: Payer: Self-pay | Admitting: *Deleted

## 2019-04-20 NOTE — Telephone Encounter (Signed)
Called patient to inform that CT has been rescheduled for 04-27-19 - arrival time- 1:15 pm @ [facility], patient to be NPO- 4 hrs. prior to test, and patient to pick-up contrast on 04-26-19 and patient to fu with Dr. Isidore Moos on 04-29-19, spoke with patient's daughter- Joellen [place] and she is aware of these appts.

## 2019-04-22 DIAGNOSIS — G894 Chronic pain syndrome: Secondary | ICD-10-CM | POA: Diagnosis not present

## 2019-04-22 DIAGNOSIS — M4722 Other spondylosis with radiculopathy, cervical region: Secondary | ICD-10-CM | POA: Diagnosis not present

## 2019-04-22 DIAGNOSIS — M48061 Spinal stenosis, lumbar region without neurogenic claudication: Secondary | ICD-10-CM | POA: Diagnosis not present

## 2019-04-22 DIAGNOSIS — M47816 Spondylosis without myelopathy or radiculopathy, lumbar region: Secondary | ICD-10-CM | POA: Diagnosis not present

## 2019-04-27 ENCOUNTER — Encounter (HOSPITAL_COMMUNITY): Payer: Self-pay

## 2019-04-27 ENCOUNTER — Other Ambulatory Visit: Payer: Self-pay

## 2019-04-27 ENCOUNTER — Ambulatory Visit (HOSPITAL_COMMUNITY)
Admission: RE | Admit: 2019-04-27 | Discharge: 2019-04-27 | Disposition: A | Discharge status: Home / Self Care | Payer: Medicare Other | Source: Ambulatory Visit | Attending: Radiation Oncology | Admitting: Radiation Oncology

## 2019-04-27 DIAGNOSIS — R918 Other nonspecific abnormal finding of lung field: Secondary | ICD-10-CM | POA: Diagnosis not present

## 2019-04-27 DIAGNOSIS — K769 Liver disease, unspecified: Secondary | ICD-10-CM | POA: Insufficient documentation

## 2019-04-27 DIAGNOSIS — N200 Calculus of kidney: Secondary | ICD-10-CM | POA: Diagnosis not present

## 2019-04-27 DIAGNOSIS — R911 Solitary pulmonary nodule: Secondary | ICD-10-CM | POA: Insufficient documentation

## 2019-04-27 DIAGNOSIS — K5730 Diverticulosis of large intestine without perforation or abscess without bleeding: Secondary | ICD-10-CM | POA: Diagnosis not present

## 2019-04-27 MED ORDER — IOHEXOL 300 MG/ML  SOLN
100.0000 mL | Freq: Once | INTRAMUSCULAR | Status: AC | PRN
  Administered 2019-04-27: 14:00:00 100 mL via INTRAVENOUS

## 2019-04-27 MED ORDER — SODIUM CHLORIDE (PF) 0.9 % IJ SOLN
INTRAMUSCULAR | Status: AC
  Filled 2019-04-27: qty 50

## 2019-04-27 NOTE — Progress Notes (Signed)
[name] Day presents for follow up. He is here to discuss the results of a CT abdomen and chest completed 04/27/19 and further care to be determined based on results. He reports shortness of breath with activity. He denies a cough. He reports he is eating well, his appetite is good.   Wt Readings from Last 3 Encounters:  09/09/17 185 lb 12.8 oz (84.3 kg)  07/09/17 181 lb 9.6 oz (82.4 kg)  07/02/17 179 lb (81.2 kg)

## 2019-04-28 NOTE — Progress Notes (Signed)
Radiation Oncology         [phone] ________________________________  Name: [name] Day MRN: [id number]  Date: 04/29/2019  DOB: May 08, 1946  Follow-Up Visit Note  Outpatient  CC: [name] Hickman, FNP  Grace Isaac, MD  Diagnosis and Prior Radiotherapy:    ICD-10-CM   1. 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 The patient also has suspicious stage IB lung cancer in the left upper lobe (see below)  Cancer Staging Cancer of upper lobe of left lung (HCC) Staging form: Lung, AJCC 8th Edition - Clinical: Stage IB (cT2a, cN0, cM0) - Signed by Eppie Gibson, MD on 07/09/2017  Non-small cell cancer of right lung [facility]) Staging form: Lung, AJCC 7th Edition - Pathologic stage from 12/01/2014: Stage IB (T2a, N0, cM0) - Signed by Grace Isaac, MD on 12/01/2014   CHIEF COMPLAINT: Here for follow-up and surveillance of lung cancer  Narrative:  The patient returns today for follow-up. He was last seen by me on 09/09/2017. He has not seen Dr. Julien Nordmann since 06/2017.  Recall his prior therapy consists of: right upper lobectomy with lymph node dissection in May 2016 under the care of Dr. Servando Snare; adjuvant systemic chemotherapy with carboplatin for AUC of 5 and paclitaxel 175 MG/M2 every 3 weeks, s/p 3 cycles. His last PFT was in May 2016 and his last appointment with Dr. Servando Snare was in August 2017. At that time, he was noted to be a poor candidate for surgery.   He underwent chest/abdomen CT on 04/27/2019, which showed: disease progression with marked enlargement of what is now a 4.3 cm left upper lobe mass with AP window lymphadenopathy and multiple metastatic lesions throughout the lung bilaterally; progressive enlargement of previously noted hepatic mass, now with some upper abdominal lymphadenopathy.  He denies hemoptysis denies new shortness of breath but does have  baseline shortness of breath.  He cut down on smoking significantly in April but admits to puffing a cigarette every now and then.  He has lost some weight but reports a decent appetite and eating a normal amount.  Wt Readings from Last 3 Encounters:  04/29/19 165 lb 12.8 oz (75.2 kg)  09/09/17 185 lb 12.8 oz (84.3 kg)  07/09/17 181 lb 9.6 oz (82.4 kg)     ALLERGIES:  has No Known Allergies.  Meds: Current Outpatient Medications  Medication Sig Dispense Refill   albuterol (PROVENTIL HFA;VENTOLIN HFA) 108 (90 Base) MCG/ACT inhaler Inhale 2 puffs into the lungs every 4 (four) hours as needed for wheezing or shortness of breath. For wheezing 1 Inhaler 5   aspirin EC 81 MG tablet Take 1 tablet (81 mg total) by mouth daily.     buPROPion (WELLBUTRIN SR) 150 MG 12 hr tablet Start one week before quit date. Take 1 tab daily x 3 days, then 1 tab BID thereafter. 60 tablet 2   Cholecalciferol 4000 UNITS CAPS Take 1 capsule by mouth daily.     fentaNYL (DURAGESIC - DOSED MCG/HR) 75 MCG/HR Place 1 patch onto the skin every 3 (three) days.     glimepiride (AMARYL) 2 MG tablet Take 1 tablet (2 mg total) by mouth daily with breakfast. 30 tablet 2   losartan-hydrochlorothiazide (HYZAAR) 100-12.5 MG per tablet Take 1 tablet by mouth daily.     metFORMIN (GLUCOPHAGE-XR) 500 MG 24 hr tablet Take 500 mg by mouth 2 (  two) times daily. 1 in am and 2 hs     OXYCODONE HCL PO Take 20 mg by mouth every 4 (four) hours as needed.     pravastatin (PRAVACHOL) 20 MG tablet Take 20 mg by mouth daily.      SYMBICORT 160-4.5 MCG/ACT inhaler INHALE 2 PUFFS FIRST THING IN THE MORNING; AND THEN ANOTHER 2 PUFFS ABOUT 12 HOURS LATER. 10.2 g 3   tiotropium (SPIRIVA HANDIHALER) 18 MCG inhalation capsule Place 1 capsule (18 mcg total) into inhaler and inhale daily. 30 capsule 5   nicotine (NICODERM CQ - DOSED IN MG/24 HOURS) 14 mg/24hr patch Apply 21 mg patch daily x 6 wk, then 14mg  patch daily x 2 wk, then 7 mg patch  daily x 2 wk (Patient not taking: Reported on 09/09/2017) 14 patch 0   nicotine (NICODERM CQ - DOSED IN MG/24 HOURS) 21 mg/24hr patch Apply 21 mg patch daily x 6 wk, then 14mg  patch daily x 2 wk, then 7 mg patch daily x 2 wk (Patient not taking: Reported on 09/09/2017) 14 patch 2   nicotine (NICODERM CQ - DOSED IN MG/24 HR) 7 mg/24hr patch Apply 21 mg patch daily x 6 wk, then 14mg  patch daily x 2 wk, then 7 mg patch daily x 2 wk (Patient not taking: Reported on 09/09/2017) 14 patch 0   No current facility-administered medications for this encounter.     Physical Findings: The patient is in no acute distress. Patient is alert and oriented.  weight is 165 lb 12.8 oz (75.2 kg). His tympanic temperature is 98.5 F (36.9 C). His blood pressure is 147/75 (abnormal) and his pulse is 79. His respiration is 17 and oxygen saturation is 97%. .    General: Alert and oriented, in no acute distress He is sitting in a wheelchair in no obvious respiratory distress  ECOG = 2  0 - Asymptomatic (Fully active, able to carry on all predisease activities without restriction)  1 - Symptomatic but completely ambulatory (Restricted in physically strenuous activity but ambulatory and able to carry out work of a light or sedentary nature. For example, light housework, office work)  2 - Symptomatic, <50% in bed during the day (Ambulatory and capable of all self care but unable to carry out any work activities. Up and about more than 50% of waking hours)  3 - Symptomatic, >50% in bed, but not bedbound (Capable of only limited self-care, confined to bed or chair 50% or more of waking hours)  4 - Bedbound (Completely disabled. Cannot carry on any self-care. Totally confined to bed or chair)  5 - Death   Eustace Pen MM, Creech RH, Tormey DC, et al. [phone]). "Toxicity and response criteria of the [facility]". [place] Oncol. 5 (6): 649-55   Lab Findings: Lab Results  Component Value Date   WBC  11.8 (H) 12/28/2017   HGB 16.2 12/28/2017   HCT 47.3 12/28/2017   MCV 95.7 12/28/2017   PLT 264 12/28/2017    Radiographic Findings: Ct Chest W Contrast  Result Date: 04/27/2019 CLINICAL DATA:  72 year old male with history of lung cancer status post right upper lobectomy. EXAM: CT CHEST AND ABDOMEN WITH CONTRAST TECHNIQUE: Multidetector CT imaging of the chest and abdomen was performed following the standard protocol during bolus administration of intravenous contrast. CONTRAST:  134mL OMNIPAQUE IOHEXOL 300 MG/ML  SOLN COMPARISON:  Chest CT 12/28/2017. FINDINGS: CT CHEST FINDINGS Cardiovascular: Heart size is normal. There is no significant pericardial fluid, thickening  or pericardial calcification. There is aortic atherosclerosis, as well as atherosclerosis of the great vessels of the mediastinum and the coronary arteries, including calcified atherosclerotic plaque in the left main, left anterior descending, left circumflex and right coronary arteries. Calcifications of the aortic valve, mitral valve and mitral annulus Mediastinum/Nodes: Multiple prominent borderline enlarged mediastinal and hilar lymph nodes. Mildly enlarged AP window lymph node measuring 1.5 cm in short axis (axial image 22 of series 2), significantly enlarged compared to the prior study. Esophagus is unremarkable in appearance. No axillary lymphadenopathy. Lungs/Pleura: When compared to the prior examination there has been significant interval growth of the previously noted left upper lobe nodule which has become a much larger 4.0 x 2.0 x 4.3 cm mass (axial image 40 of series 7 and coronal image 84 of series 5) which abuts the pleural surface in direct contact with the distal aortic arch. Adjacent patchy areas of ground-glass attenuation are noted in the left upper lobe, nonspecific, potentially postobstructive changes, although additional areas of tumoral infiltration are not entirely excluded. Regardless, there multiple new  pulmonary nodules scattered throughout the lungs bilaterally, most evident in the lung bases, presumably reflective of widespread metastatic disease, with the largest of these nodules in the periphery of the right lower lobe (axial image 102 of series 7) measuring 1.4 x 1.0 cm. Status post right upper lobectomy with compensatory hyperexpansion of the right middle and lower lobes. Peripheral predominant areas of septal thickening and subpleural reticulation are noted in the lungs, most evident in the right lower lobe, concerning for underlying pulmonary fibrosis. There is also mild diffuse bronchial wall thickening with mild centrilobular and paraseptal emphysema. Musculoskeletal: There are no aggressive appearing lytic or blastic lesions noted in the visualized portions of the skeleton. CT ABDOMEN FINDINGS Hepatobiliary: Marked enlargement of the previously noted hepatic mass which is centered in segments 6 and 7 of the liver, currently measuring 9.3 x 10.2 x 8.6 cm (axial image 55 of series 2 and coronal image 90 of series 5). Heterogeneous areas of perfusion also noted elsewhere in the liver, without additional discrete hepatic masses. No intra or extrahepatic biliary ductal dilatation. Gallbladder is normal in appearance. Pancreas: No pancreatic mass. No pancreatic ductal dilatation. No pancreatic or peripancreatic fluid collections or inflammatory changes. Spleen: Unremarkable. Adrenals/Urinary Tract: 3 mm nonobstructive calculus in the interpolar collecting system of the left kidney. Bilateral kidneys and adrenal glands are otherwise normal in appearance. No hydroureteronephrosis in the visualized portions of the abdomen. Stomach/Bowel: Normal appearance of the stomach. No pathologic dilatation of small bowel or colon. A few scattered colonic diverticulae are noted, most evident in the descending colon, without surrounding inflammatory changes. Vascular/Lymphatic: Aortic atherosclerosis. Hepatoduodenal ligament  lymph nodes measuring up to 1.5 cm in short axis. Other: No significant volume of ascites and no pneumoperitoneum noted in the visualized portions of the peritoneal cavity. Suture material in the right anterior abdominal wall musculature incidentally noted. Musculoskeletal: There are no aggressive appearing lytic or blastic lesions noted in the visualized portions of the skeleton. IMPRESSION: 1. Today's study demonstrates progression of disease with marked enlargement of what is now a 4.0 x 2.0 x 4.3 cm left upper lobe mass with AP window lymphadenopathy and enumerable metastatic lesions throughout the lungs bilaterally, as well as progressive enlargement of the previously noted hepatic mass, now with some upper abdominal lymphadenopathy, as detailed above. 2. Aortic atherosclerosis, in addition to left main and 3 vessel coronary artery disease. Assessment for potential risk factor modification, dietary therapy or  pharmacologic therapy may be warranted, if clinically indicated. 3. There are calcifications of the aortic valve, mitral valve and mitral annulus. Echocardiographic correlation for evaluation of potential valvular dysfunction may be warranted if clinically indicated. 4. 3 mm nonobstructive calculus in the interpolar collecting system of left kidney. 5. Colonic diverticulosis. Electronically Signed   By: Vinnie Langton M.D.   On: 04/27/2019 15:30   Ct Abdomen W Contrast  Result Date: 04/27/2019 CLINICAL DATA:  73 year old male with history of lung cancer status post right upper lobectomy. EXAM: CT CHEST AND ABDOMEN WITH CONTRAST TECHNIQUE: Multidetector CT imaging of the chest and abdomen was performed following the standard protocol during bolus administration of intravenous contrast. CONTRAST:  172mL OMNIPAQUE IOHEXOL 300 MG/ML  SOLN COMPARISON:  Chest CT 12/28/2017. FINDINGS: CT CHEST FINDINGS Cardiovascular: Heart size is normal. There is no significant pericardial fluid, thickening or  pericardial calcification. There is aortic atherosclerosis, as well as atherosclerosis of the great vessels of the mediastinum and the coronary arteries, including calcified atherosclerotic plaque in the left main, left anterior descending, left circumflex and right coronary arteries. Calcifications of the aortic valve, mitral valve and mitral annulus Mediastinum/Nodes: Multiple prominent borderline enlarged mediastinal and hilar lymph nodes. Mildly enlarged AP window lymph node measuring 1.5 cm in short axis (axial image 22 of series 2), significantly enlarged compared to the prior study. Esophagus is unremarkable in appearance. No axillary lymphadenopathy. Lungs/Pleura: When compared to the prior examination there has been significant interval growth of the previously noted left upper lobe nodule which has become a much larger 4.0 x 2.0 x 4.3 cm mass (axial image 40 of series 7 and coronal image 84 of series 5) which abuts the pleural surface in direct contact with the distal aortic arch. Adjacent patchy areas of ground-glass attenuation are noted in the left upper lobe, nonspecific, potentially postobstructive changes, although additional areas of tumoral infiltration are not entirely excluded. Regardless, there multiple new pulmonary nodules scattered throughout the lungs bilaterally, most evident in the lung bases, presumably reflective of widespread metastatic disease, with the largest of these nodules in the periphery of the right lower lobe (axial image 102 of series 7) measuring 1.4 x 1.0 cm. Status post right upper lobectomy with compensatory hyperexpansion of the right middle and lower lobes. Peripheral predominant areas of septal thickening and subpleural reticulation are noted in the lungs, most evident in the right lower lobe, concerning for underlying pulmonary fibrosis. There is also mild diffuse bronchial wall thickening with mild centrilobular and paraseptal emphysema. Musculoskeletal: There are no  aggressive appearing lytic or blastic lesions noted in the visualized portions of the skeleton. CT ABDOMEN FINDINGS Hepatobiliary: Marked enlargement of the previously noted hepatic mass which is centered in segments 6 and 7 of the liver, currently measuring 9.3 x 10.2 x 8.6 cm (axial image 55 of series 2 and coronal image 90 of series 5). Heterogeneous areas of perfusion also noted elsewhere in the liver, without additional discrete hepatic masses. No intra or extrahepatic biliary ductal dilatation. Gallbladder is normal in appearance. Pancreas: No pancreatic mass. No pancreatic ductal dilatation. No pancreatic or peripancreatic fluid collections or inflammatory changes. Spleen: Unremarkable. Adrenals/Urinary Tract: 3 mm nonobstructive calculus in the interpolar collecting system of the left kidney. Bilateral kidneys and adrenal glands are otherwise normal in appearance. No hydroureteronephrosis in the visualized portions of the abdomen. Stomach/Bowel: Normal appearance of the stomach. No pathologic dilatation of small bowel or colon. A few scattered colonic diverticulae are noted, most evident in the descending  colon, without surrounding inflammatory changes. Vascular/Lymphatic: Aortic atherosclerosis. Hepatoduodenal ligament lymph nodes measuring up to 1.5 cm in short axis. Other: No significant volume of ascites and no pneumoperitoneum noted in the visualized portions of the peritoneal cavity. Suture material in the right anterior abdominal wall musculature incidentally noted. Musculoskeletal: There are no aggressive appearing lytic or blastic lesions noted in the visualized portions of the skeleton. IMPRESSION: 1. Today's study demonstrates progression of disease with marked enlargement of what is now a 4.0 x 2.0 x 4.3 cm left upper lobe mass with AP window lymphadenopathy and enumerable metastatic lesions throughout the lungs bilaterally, as well as progressive enlargement of the previously noted hepatic mass,  now with some upper abdominal lymphadenopathy, as detailed above. 2. Aortic atherosclerosis, in addition to left main and 3 vessel coronary artery disease. Assessment for potential risk factor modification, dietary therapy or pharmacologic therapy may be warranted, if clinically indicated. 3. There are calcifications of the aortic valve, mitral valve and mitral annulus. Echocardiographic correlation for evaluation of potential valvular dysfunction may be warranted if clinically indicated. 4. 3 mm nonobstructive calculus in the interpolar collecting system of left kidney. 5. Colonic diverticulosis. Electronically Signed   By: Vinnie Langton M.D.   On: 04/27/2019 15:30    Impression/Plan: I discussed the patient's CT results with him as well as with his daughter [name] Day who was on speaker phone for the encounter.    He had asked to see me back for reconsideration of SBRT to the left upper lobe lung nodule since he had largely stopped smoking for several months.    The patient has been lost to follow-up in medical oncology.  Unfortunately the CT images that I obtained for restaging show an obvious liver mass and multiple suspicious nodules in the lungs.  I explained to him that he has metastatic disease and that systemic therapy is in his best interest if he wants to manage that aggressively.  I have asked Mont Dutton of our CNS team to order an MRI of his brain w/SRS protocol in case he has any brain mets.  I sent a note to Dr. Earlie Server letting him know we will defer to him on ordering the PET scan and arranging follow-up in medical oncology after that scan is complete for restaging  I will see him as needed depending upon the results to the MRI brain.  Otherwise the patient will hopefully be a candidate for some form of systemic therapy.  I expressed sympathy to the patient and his daughter regarding these unfortunate results in his CT scan.  I wish them the best.  I spent 15 minutes face to  face with the patient and more than 50% of that time was spent in counseling and/or coordination of care. _____________________________________   Eppie Gibson, MD  This document serves as a record of services personally performed by Eppie Gibson, MD. It was created on her behalf by Wilburn Mylar, a trained medical scribe. The creation of this record is based on the scribe's personal observations and the provider's statements to them. This document has been checked and approved by the attending provider.

## 2019-04-29 ENCOUNTER — Encounter: Payer: Self-pay | Admitting: Radiation Oncology

## 2019-04-29 ENCOUNTER — Ambulatory Visit
Admission: RE | Admit: 2019-04-29 | Discharge: 2019-04-29 | Disposition: A | Discharge status: Home / Self Care | Payer: Medicare Other | Source: Ambulatory Visit | Attending: Radiation Oncology | Admitting: Radiation Oncology

## 2019-04-29 ENCOUNTER — Other Ambulatory Visit: Payer: Self-pay

## 2019-04-29 DIAGNOSIS — R160 Hepatomegaly, not elsewhere classified: Secondary | ICD-10-CM | POA: Insufficient documentation

## 2019-04-29 DIAGNOSIS — C3411 Malignant neoplasm of upper lobe, right bronchus or lung: Secondary | ICD-10-CM | POA: Diagnosis not present

## 2019-04-29 DIAGNOSIS — R634 Abnormal weight loss: Secondary | ICD-10-CM | POA: Insufficient documentation

## 2019-04-29 DIAGNOSIS — C7802 Secondary malignant neoplasm of left lung: Secondary | ICD-10-CM | POA: Insufficient documentation

## 2019-04-29 DIAGNOSIS — Z923 Personal history of irradiation: Secondary | ICD-10-CM | POA: Insufficient documentation

## 2019-04-29 DIAGNOSIS — N200 Calculus of kidney: Secondary | ICD-10-CM | POA: Insufficient documentation

## 2019-04-29 DIAGNOSIS — Z79899 Other long term (current) drug therapy: Secondary | ICD-10-CM | POA: Insufficient documentation

## 2019-04-29 DIAGNOSIS — I700 Atherosclerosis of aorta: Secondary | ICD-10-CM | POA: Diagnosis not present

## 2019-04-29 DIAGNOSIS — Z85118 Personal history of other malignant neoplasm of bronchus and lung: Secondary | ICD-10-CM | POA: Diagnosis not present

## 2019-04-29 DIAGNOSIS — K5730 Diverticulosis of large intestine without perforation or abscess without bleeding: Secondary | ICD-10-CM | POA: Diagnosis not present

## 2019-04-29 DIAGNOSIS — Z08 Encounter for follow-up examination after completed treatment for malignant neoplasm: Secondary | ICD-10-CM | POA: Diagnosis not present

## 2019-04-29 DIAGNOSIS — Z7982 Long term (current) use of aspirin: Secondary | ICD-10-CM | POA: Diagnosis not present

## 2019-04-29 DIAGNOSIS — F17210 Nicotine dependence, cigarettes, uncomplicated: Secondary | ICD-10-CM | POA: Insufficient documentation

## 2019-04-29 DIAGNOSIS — Z7984 Long term (current) use of oral hypoglycemic drugs: Secondary | ICD-10-CM | POA: Insufficient documentation

## 2019-04-29 DIAGNOSIS — I2510 Atherosclerotic heart disease of native coronary artery without angina pectoris: Secondary | ICD-10-CM | POA: Insufficient documentation

## 2019-04-29 DIAGNOSIS — K7689 Other specified diseases of liver: Secondary | ICD-10-CM | POA: Diagnosis not present

## 2019-04-29 DIAGNOSIS — C787 Secondary malignant neoplasm of liver and intrahepatic bile duct: Secondary | ICD-10-CM | POA: Insufficient documentation

## 2019-04-29 DIAGNOSIS — Z87891 Personal history of nicotine dependence: Secondary | ICD-10-CM | POA: Diagnosis not present

## 2019-04-29 DIAGNOSIS — C3412 Malignant neoplasm of upper lobe, left bronchus or lung: Secondary | ICD-10-CM

## 2019-04-29 DIAGNOSIS — R918 Other nonspecific abnormal finding of lung field: Secondary | ICD-10-CM | POA: Diagnosis not present

## 2019-05-02 ENCOUNTER — Other Ambulatory Visit: Payer: Self-pay | Admitting: Radiation Therapy

## 2019-05-02 ENCOUNTER — Encounter: Payer: Self-pay | Admitting: *Deleted

## 2019-05-02 ENCOUNTER — Other Ambulatory Visit: Payer: Self-pay | Admitting: *Deleted

## 2019-05-02 ENCOUNTER — Telehealth: Payer: Self-pay | Admitting: Radiation Therapy

## 2019-05-02 ENCOUNTER — Telehealth: Payer: Self-pay | Admitting: Physician Assistant

## 2019-05-02 DIAGNOSIS — C3490 Malignant neoplasm of unspecified part of unspecified bronchus or lung: Secondary | ICD-10-CM

## 2019-05-02 DIAGNOSIS — C3491 Malignant neoplasm of unspecified part of right bronchus or lung: Secondary | ICD-10-CM

## 2019-05-02 NOTE — Telephone Encounter (Signed)
Scheduled appt per 10/19 sch message - pt wife is aware of appt

## 2019-05-02 NOTE — Progress Notes (Signed)
Oncology Nurse Navigator Documentation  Oncology Nurse Navigator Flowsheets 05/02/2019  Navigator Location [facility]  Referral Date to RadOnc/MedOnc -  Navigator Encounter Type Other/I received a message that patient need to be seen with med onc.  I updated scheduling dept to get patient in to see Cassie tomorrow.   Barriers/Navigation Needs Coordination of Care  Interventions Coordination of Care  Coordination of Care Other  Support Groups/Services -  Acuity Level 2-Minimal Needs (1-2 Barriers Identified)  Time Spent with Patient 15  Time Spent with Patient (Retired) -

## 2019-05-02 NOTE — Telephone Encounter (Signed)
Spoke with wife about his upcoming brain MRI appointment.   Mont Dutton R.T.(R)(T) Radiation Special Procedures Navigator

## 2019-05-03 ENCOUNTER — Inpatient Hospital Stay: Payer: Medicare Other | Admitting: Physician Assistant

## 2019-05-03 ENCOUNTER — Inpatient Hospital Stay: Payer: Medicare Other

## 2019-05-13 ENCOUNTER — Other Ambulatory Visit: Payer: Self-pay

## 2019-05-13 ENCOUNTER — Encounter (HOSPITAL_COMMUNITY): Payer: Self-pay

## 2019-05-13 ENCOUNTER — Ambulatory Visit (HOSPITAL_COMMUNITY)
Admission: RE | Admit: 2019-05-13 | Discharge: 2019-05-13 | Disposition: A | Discharge status: Home / Self Care | Payer: Medicare Other | Source: Ambulatory Visit | Attending: Radiation Oncology | Admitting: Radiation Oncology

## 2019-05-13 DIAGNOSIS — C3490 Malignant neoplasm of unspecified part of unspecified bronchus or lung: Secondary | ICD-10-CM

## 2019-05-13 NOTE — Progress Notes (Signed)
Pt came for MRI and could not tolerate exam due to claustrophobia. Pt saw the MRI Scanner and asked how long he had to be in there and once he learned he would have to be in the machine for 30-38mins he said he could not do it. Pt would not try the exam at all he could not tolerate the thought of being in the scanner for that long. Explained that ordering provider could discuss other options regarding obtaining images.

## 2019-05-16 ENCOUNTER — Inpatient Hospital Stay: Payer: Medicare Other | Admitting: Physician Assistant

## 2019-05-16 ENCOUNTER — Inpatient Hospital Stay: Payer: Medicare Other

## 2019-05-16 ENCOUNTER — Telehealth: Payer: Self-pay | Admitting: Physician Assistant

## 2019-05-16 ENCOUNTER — Inpatient Hospital Stay: Payer: Medicare Other | Attending: Radiation Oncology

## 2019-05-16 NOTE — Telephone Encounter (Signed)
Returned patient's phone call regarding rescheduling an appointment, per patient's request 11/02 appointment has moved to 11/09.

## 2019-05-20 DIAGNOSIS — M4722 Other spondylosis with radiculopathy, cervical region: Secondary | ICD-10-CM | POA: Diagnosis not present

## 2019-05-20 DIAGNOSIS — Z79891 Long term (current) use of opiate analgesic: Secondary | ICD-10-CM | POA: Diagnosis not present

## 2019-05-20 DIAGNOSIS — G894 Chronic pain syndrome: Secondary | ICD-10-CM | POA: Diagnosis not present

## 2019-05-20 DIAGNOSIS — M48062 Spinal stenosis, lumbar region with neurogenic claudication: Secondary | ICD-10-CM | POA: Diagnosis not present

## 2019-05-20 DIAGNOSIS — M47816 Spondylosis without myelopathy or radiculopathy, lumbar region: Secondary | ICD-10-CM | POA: Diagnosis not present

## 2019-05-23 ENCOUNTER — Inpatient Hospital Stay: Payer: Medicare Other

## 2019-05-23 ENCOUNTER — Inpatient Hospital Stay: Payer: Medicare Other | Admitting: Physician Assistant

## 2019-05-23 ENCOUNTER — Telehealth: Payer: Self-pay | Admitting: Internal Medicine

## 2019-05-23 NOTE — Telephone Encounter (Signed)
Returned patient's phone call regarding rescheduling an appointment, per patient's request 11/09 missed appointment has been rescheduled to 11/13.

## 2019-05-25 DIAGNOSIS — Z23 Encounter for immunization: Secondary | ICD-10-CM | POA: Diagnosis not present

## 2019-05-25 DIAGNOSIS — E119 Type 2 diabetes mellitus without complications: Secondary | ICD-10-CM | POA: Diagnosis not present

## 2019-05-25 DIAGNOSIS — E785 Hyperlipidemia, unspecified: Secondary | ICD-10-CM | POA: Diagnosis not present

## 2019-05-25 DIAGNOSIS — I10 Essential (primary) hypertension: Secondary | ICD-10-CM | POA: Diagnosis not present

## 2019-05-25 DIAGNOSIS — C3492 Malignant neoplasm of unspecified part of left bronchus or lung: Secondary | ICD-10-CM | POA: Diagnosis not present

## 2019-05-27 ENCOUNTER — Other Ambulatory Visit: Payer: Medicare Other

## 2019-05-27 ENCOUNTER — Ambulatory Visit: Payer: Medicare Other | Admitting: Physician Assistant

## 2019-05-31 ENCOUNTER — Telehealth: Payer: Self-pay | Admitting: Physician Assistant

## 2019-05-31 NOTE — Telephone Encounter (Signed)
Spoke to the patient to remind them that he has an appointment tomorrow at 8:00 AM since he has not showed up to multiple visits. He expressed understanding.

## 2019-06-01 ENCOUNTER — Ambulatory Visit: Payer: Medicare Other | Admitting: Physician Assistant

## 2019-06-01 ENCOUNTER — Telehealth: Payer: Self-pay | Admitting: Physician Assistant

## 2019-06-01 ENCOUNTER — Other Ambulatory Visit: Payer: Medicare Other

## 2019-06-01 NOTE — Telephone Encounter (Signed)
R/s appt per 11/18 sch message. But cancelled per pt request. Message sent to [facility] to let her know

## 2019-06-15 ENCOUNTER — Inpatient Hospital Stay: Payer: Medicare Other | Admitting: Physician Assistant

## 2019-06-15 ENCOUNTER — Encounter: Payer: Self-pay | Admitting: Medical Oncology

## 2019-06-15 ENCOUNTER — Other Ambulatory Visit: Payer: Self-pay | Admitting: Physician Assistant

## 2019-06-15 ENCOUNTER — Inpatient Hospital Stay: Payer: Medicare Other | Attending: Physician Assistant

## 2019-06-17 DIAGNOSIS — M48062 Spinal stenosis, lumbar region with neurogenic claudication: Secondary | ICD-10-CM | POA: Diagnosis not present

## 2019-06-17 DIAGNOSIS — M47816 Spondylosis without myelopathy or radiculopathy, lumbar region: Secondary | ICD-10-CM | POA: Diagnosis not present

## 2019-06-17 DIAGNOSIS — M4722 Other spondylosis with radiculopathy, cervical region: Secondary | ICD-10-CM | POA: Diagnosis not present

## 2019-06-17 DIAGNOSIS — G894 Chronic pain syndrome: Secondary | ICD-10-CM | POA: Diagnosis not present

## 2019-06-28 ENCOUNTER — Telehealth: Payer: Self-pay | Admitting: *Deleted

## 2019-06-28 NOTE — Telephone Encounter (Signed)
[place] Work  Clinical Social Work was notified by Engineer, building services patient has missed several scheduled appointments- requested CSW contact patient to determine any barriers to care.  CSW made multiple attempts- patients number is disconnected and left voicemail's for patient's spouse and daughter. CSW Gaffer of inability to contact patient by phone.   Gwinda Maine, LCSW  Clinical Social Worker [facility]

## 2019-08-12 DIAGNOSIS — G894 Chronic pain syndrome: Secondary | ICD-10-CM | POA: Diagnosis not present

## 2019-08-12 DIAGNOSIS — M4722 Other spondylosis with radiculopathy, cervical region: Secondary | ICD-10-CM | POA: Diagnosis not present

## 2019-08-12 DIAGNOSIS — M47816 Spondylosis without myelopathy or radiculopathy, lumbar region: Secondary | ICD-10-CM | POA: Diagnosis not present

## 2019-08-12 DIAGNOSIS — M48061 Spinal stenosis, lumbar region without neurogenic claudication: Secondary | ICD-10-CM | POA: Diagnosis not present

## 2019-09-09 DIAGNOSIS — G894 Chronic pain syndrome: Secondary | ICD-10-CM | POA: Diagnosis not present

## 2019-09-09 DIAGNOSIS — M4722 Other spondylosis with radiculopathy, cervical region: Secondary | ICD-10-CM | POA: Diagnosis not present

## 2019-09-09 DIAGNOSIS — Z79891 Long term (current) use of opiate analgesic: Secondary | ICD-10-CM | POA: Diagnosis not present

## 2019-09-09 DIAGNOSIS — M47816 Spondylosis without myelopathy or radiculopathy, lumbar region: Secondary | ICD-10-CM | POA: Diagnosis not present

## 2019-09-14 DIAGNOSIS — R7303 Prediabetes: Secondary | ICD-10-CM | POA: Diagnosis not present

## 2019-09-14 DIAGNOSIS — C3492 Malignant neoplasm of unspecified part of left bronchus or lung: Secondary | ICD-10-CM | POA: Diagnosis not present

## 2019-09-14 DIAGNOSIS — E785 Hyperlipidemia, unspecified: Secondary | ICD-10-CM | POA: Diagnosis not present

## 2019-09-14 DIAGNOSIS — I10 Essential (primary) hypertension: Secondary | ICD-10-CM | POA: Diagnosis not present

## 2019-09-25 DIAGNOSIS — C787 Secondary malignant neoplasm of liver and intrahepatic bile duct: Secondary | ICD-10-CM | POA: Diagnosis not present

## 2019-09-25 DIAGNOSIS — J449 Chronic obstructive pulmonary disease, unspecified: Secondary | ICD-10-CM | POA: Diagnosis not present

## 2019-09-25 DIAGNOSIS — E7800 Pure hypercholesterolemia, unspecified: Secondary | ICD-10-CM | POA: Diagnosis not present

## 2019-09-25 DIAGNOSIS — R0602 Shortness of breath: Secondary | ICD-10-CM | POA: Diagnosis not present

## 2019-09-25 DIAGNOSIS — C3490 Malignant neoplasm of unspecified part of unspecified bronchus or lung: Secondary | ICD-10-CM | POA: Diagnosis not present

## 2019-09-25 DIAGNOSIS — Z5111 Encounter for antineoplastic chemotherapy: Secondary | ICD-10-CM | POA: Diagnosis not present

## 2019-09-25 DIAGNOSIS — I10 Essential (primary) hypertension: Secondary | ICD-10-CM | POA: Diagnosis not present

## 2019-09-25 DIAGNOSIS — C799 Secondary malignant neoplasm of unspecified site: Secondary | ICD-10-CM | POA: Diagnosis not present

## 2019-09-25 DIAGNOSIS — K921 Melena: Secondary | ICD-10-CM | POA: Diagnosis not present

## 2019-09-26 ENCOUNTER — Telehealth: Payer: Self-pay | Admitting: Medical Oncology

## 2019-09-26 NOTE — Telephone Encounter (Signed)
Wife requested hospice yesterday . Pt went to hospital yesterday and declined further treatment. Hospice director will be attending.

## 2019-09-29 DIAGNOSIS — J80 Acute respiratory distress syndrome: Secondary | ICD-10-CM | POA: Diagnosis not present

## 2019-09-29 DIAGNOSIS — R52 Pain, unspecified: Secondary | ICD-10-CM | POA: Diagnosis not present

## 2019-09-29 DIAGNOSIS — M2550 Pain in unspecified joint: Secondary | ICD-10-CM | POA: Diagnosis not present

## 2019-09-29 DIAGNOSIS — R410 Disorientation, unspecified: Secondary | ICD-10-CM | POA: Diagnosis not present

## 2019-09-29 DIAGNOSIS — Z7401 Bed confinement status: Secondary | ICD-10-CM | POA: Diagnosis not present

## 2019-10-03 ENCOUNTER — Telehealth: Payer: Self-pay | Admitting: Medical Oncology

## 2019-10-03 NOTE — Telephone Encounter (Signed)
Pt passed away 10-19-2019.

## 2019-10-13 DEATH — deceased
